# Patient Record
Sex: Female | Born: 1945 | Race: White | Hispanic: No | Marital: Married | Smoking: Never smoker
Health system: Southern US, Community
[De-identification: ages and names within clinical notes are randomized; demographics above are authoritative.]

## PROBLEM LIST (undated history)

## (undated) DIAGNOSIS — I10 Essential (primary) hypertension: Secondary | ICD-10-CM

## (undated) DIAGNOSIS — E079 Disorder of thyroid, unspecified: Secondary | ICD-10-CM

## (undated) DIAGNOSIS — N183 Chronic kidney disease, stage 3 unspecified: Secondary | ICD-10-CM

## (undated) HISTORY — DX: Chronic kidney disease, stage 3 unspecified: N18.30

## (undated) HISTORY — PX: ABDOMINAL HYSTERECTOMY: SHX81

## (undated) HISTORY — PX: SHOULDER SURGERY: SHX246

## (undated) HISTORY — DX: Essential (primary) hypertension: I10

## (undated) HISTORY — PX: KNEE SURGERY: SHX244

## (undated) HISTORY — DX: Chronic kidney disease, stage 3 (moderate): N18.3

## (undated) HISTORY — DX: Disorder of thyroid, unspecified: E07.9

## (undated) HISTORY — PX: CATARACT EXTRACTION: SUR2

---

## 1997-12-13 ENCOUNTER — Other Ambulatory Visit: Admission: RE | Admit: 1997-12-13 | Discharge: 1997-12-13 | Payer: Self-pay | Admitting: Gynecology

## 2000-02-06 ENCOUNTER — Other Ambulatory Visit: Admission: RE | Admit: 2000-02-06 | Discharge: 2000-02-06 | Payer: Self-pay | Admitting: Gynecology

## 2000-08-18 ENCOUNTER — Encounter: Payer: Self-pay | Admitting: Gynecology

## 2000-08-20 ENCOUNTER — Ambulatory Visit (HOSPITAL_COMMUNITY): Admission: RE | Admit: 2000-08-20 | Discharge: 2000-08-20 | Payer: Self-pay | Admitting: Gynecology

## 2000-08-20 ENCOUNTER — Encounter (INDEPENDENT_AMBULATORY_CARE_PROVIDER_SITE_OTHER): Payer: Self-pay

## 2001-02-09 ENCOUNTER — Other Ambulatory Visit: Admission: RE | Admit: 2001-02-09 | Discharge: 2001-02-09 | Payer: Self-pay | Admitting: Gynecology

## 2001-10-26 ENCOUNTER — Ambulatory Visit (HOSPITAL_COMMUNITY): Admission: RE | Admit: 2001-10-26 | Discharge: 2001-10-26 | Payer: Self-pay | Admitting: Orthopedic Surgery

## 2001-10-26 ENCOUNTER — Encounter: Payer: Self-pay | Admitting: Orthopedic Surgery

## 2002-07-28 ENCOUNTER — Other Ambulatory Visit: Admission: RE | Admit: 2002-07-28 | Discharge: 2002-07-28 | Payer: Self-pay | Admitting: Gynecology

## 2004-03-30 ENCOUNTER — Emergency Department (HOSPITAL_COMMUNITY): Admission: EM | Admit: 2004-03-30 | Discharge: 2004-03-30 | Payer: Self-pay | Admitting: Emergency Medicine

## 2005-08-05 ENCOUNTER — Inpatient Hospital Stay (HOSPITAL_COMMUNITY): Admission: RE | Admit: 2005-08-05 | Discharge: 2005-08-09 | Payer: Self-pay | Admitting: Orthopedic Surgery

## 2005-11-03 ENCOUNTER — Ambulatory Visit (HOSPITAL_BASED_OUTPATIENT_CLINIC_OR_DEPARTMENT_OTHER): Admission: RE | Admit: 2005-11-03 | Discharge: 2005-11-03 | Payer: Self-pay | Admitting: Orthopedic Surgery

## 2007-03-24 ENCOUNTER — Ambulatory Visit: Payer: Self-pay

## 2007-03-25 ENCOUNTER — Ambulatory Visit: Payer: Self-pay

## 2007-08-04 ENCOUNTER — Encounter: Admission: RE | Admit: 2007-08-04 | Discharge: 2007-08-04 | Payer: Self-pay | Admitting: Orthopedic Surgery

## 2007-08-24 ENCOUNTER — Ambulatory Visit (HOSPITAL_BASED_OUTPATIENT_CLINIC_OR_DEPARTMENT_OTHER): Admission: RE | Admit: 2007-08-24 | Discharge: 2007-08-24 | Payer: Self-pay | Admitting: Orthopedic Surgery

## 2007-09-04 ENCOUNTER — Inpatient Hospital Stay (HOSPITAL_COMMUNITY): Admission: EM | Admit: 2007-09-04 | Discharge: 2007-09-08 | Payer: Self-pay | Admitting: Emergency Medicine

## 2007-09-27 ENCOUNTER — Emergency Department (HOSPITAL_COMMUNITY): Admission: EM | Admit: 2007-09-27 | Discharge: 2007-09-27 | Payer: Self-pay | Admitting: Emergency Medicine

## 2007-11-16 ENCOUNTER — Ambulatory Visit (HOSPITAL_BASED_OUTPATIENT_CLINIC_OR_DEPARTMENT_OTHER): Admission: RE | Admit: 2007-11-16 | Discharge: 2007-11-16 | Payer: Self-pay | Admitting: Orthopedic Surgery

## 2008-05-15 DIAGNOSIS — Z96659 Presence of unspecified artificial knee joint: Secondary | ICD-10-CM | POA: Insufficient documentation

## 2009-09-10 ENCOUNTER — Ambulatory Visit: Payer: Self-pay | Admitting: Family Medicine

## 2009-09-10 DIAGNOSIS — E039 Hypothyroidism, unspecified: Secondary | ICD-10-CM | POA: Insufficient documentation

## 2009-09-10 DIAGNOSIS — I10 Essential (primary) hypertension: Secondary | ICD-10-CM | POA: Insufficient documentation

## 2009-09-10 DIAGNOSIS — J309 Allergic rhinitis, unspecified: Secondary | ICD-10-CM | POA: Insufficient documentation

## 2009-10-04 ENCOUNTER — Encounter: Payer: Self-pay | Admitting: Family Medicine

## 2009-10-08 ENCOUNTER — Encounter: Payer: Self-pay | Admitting: Family Medicine

## 2010-07-01 ENCOUNTER — Emergency Department (HOSPITAL_COMMUNITY)
Admission: EM | Admit: 2010-07-01 | Discharge: 2010-07-01 | Payer: Self-pay | Source: Home / Self Care | Admitting: Emergency Medicine

## 2010-08-01 NOTE — Miscellaneous (Signed)
Summary: prior auth  Clinical Lists Changes prior auth for nasacort to md to sign. pcp not in again until monday. Dr. Deirdre Priest signed medcoo form.Golden Circle RN  October 04, 2009 3:07 PM

## 2010-08-01 NOTE — Miscellaneous (Signed)
Summary: Rx change  Clinical Lists Changes  Medications: Removed medication of NASACORT AQ 55 MCG/ACT AERS (TRIAMCINOLONE ACETONIDE(NASAL)) 2 sprays ea. nostril daily Added new medication of NASONEX 50 MCG/ACT SUSP (MOMETASONE FUROATE) 2 sprays ea. nostril daily - Signed Rx of NASONEX 50 MCG/ACT SUSP (MOMETASONE FUROATE) 2 sprays ea. nostril daily;  #1 x 3;  Signed;  Entered by: Tinnie Gens MD;  Authorized by: Tinnie Gens MD;  Method used: Electronically to [facility] [id number]*, [address] [place], [place]  [address], Ph: [phone], Fax: [phone]    Prescriptions: NASONEX 50 MCG/ACT SUSP (MOMETASONE FUROATE) 2 sprays ea. nostril daily  #1 x 3   Entered and Authorized by:   Tinnie Gens MD   Signed by:   Tinnie Gens MD on 10/08/2009   Method used:   Electronically to        [facility] [address] (retail)       [address]       [place], [place]  [address]       Ph: [phone]       Fax: [phone]   RxID:   [id number]

## 2010-08-01 NOTE — Assessment & Plan Note (Signed)
Summary: np,df   Vital Signs:  Patient profile:   65 year old female Height:      63 inches Weight:      191 pounds BMI:     33.96 BSA:     1.90 Temp:     98.2 degrees F Pulse rate:   67 / minute BP sitting:   164 / 79  Vitals Entered By: Jone Baseman CMA (September 10, 2009 8:41 AM) CC: np Is Patient Diabetic? No Pain Assessment Patient in pain? no        CC:  np.  History of Present Illness: Here today as new patient.  She has no other complaints.  She sees Dr. Yordi Krager Nones in [facility] and he manages her Hypertension and Hypothyroidism.  Told she needed pap and referred here.  Has no mammogram in many years.  does not want colonoscopy. Complains of allergic signs's.  Usually takes generic claritin but none recently.  Having sore throat, itchy eyes, runny nose.  Habits & Providers  Alcohol-Tobacco-Diet     Alcohol drinks/day: 0     Tobacco Status: never  Exercise-Depression-Behavior     Does Patient Exercise: yes     Type of exercise: walk, bike     Times/week: <3     STD Risk: never     Drug Use: never     Seat Belt Use: always     Sun Exposure: rarely  Past History:  Past Medical History: Allergic rhinitis Hypertension Hypothyroidism  Past Surgical History: Hysterectomy Total knee replacement x 3--left, septic 11 total knee surgeries Bilateral oophorectomy shoulder-bone spurs  Past History:  Care Management: Orthopedics:  Social History: Married Smoking Status:  never Does Patient Exercise:  yes STD Risk:  never Drug Use:  never Risk analyst Use:  always Sun Exposure-Excessive:  rarely  Review of Systems       Still with pain and stiffness in left knee  Physical Exam  General:  alert, well-developed, and well-nourished.   Head:  normocephalic and atraumatic.   Ears:  R ear normal and L ear normal.   Mouth:  good dentition.   Neck:  supple.   Lungs:  normal respiratory effort and normal breath sounds.   Heart:  normal rate, regular  rhythm, and no murmur.   Abdomen:  soft and non-tender.     Impression & Recommendations:  Problem # 1:  Preventive Health Care (ICD-V70.0)  Orders: T-Mammography Bilateral Screening ([address])  Complete Medication List: 1)  Nasacort Aq 55 Mcg/act Aers (Triamcinolone acetonide(nasal)) .... 2 sprays ea. nostril daily  Patient Instructions: 1)  Please schedule a follow-up appointment as needed .    Prevention & Chronic Care Immunizations   Influenza vaccine: Not documented   Influenza vaccine deferral: Refused  (09/10/2009)    Tetanus booster: Not documented    Pneumococcal vaccine: given  (09/22/2007)    H. zoster vaccine: Not documented  Colorectal Screening   Hemoccult: Not documented    Colonoscopy: Not documented   Colonoscopy action/deferral: Refused  (09/10/2009)  Other Screening   Pap smear: Not documented   Pap smear action/deferral: Not indicated S/P hysterectomy  (09/10/2009)    Mammogram: Not documented   Mammogram action/deferral: Ordered  (09/10/2009)    DXA bone density scan: Not documented   Smoking status: never  (09/10/2009)  Lipids   Total Cholesterol: Not documented   LDL: Not documented   LDL Direct: Not documented   HDL: Not documented   Triglycerides: Not documented  Hypertension  Last Blood Pressure: 164 / 79  (09/10/2009)   Serum creatinine: Not documented   BMP action: Not indicated   Serum potassium Not documented    Hypertension flowsheet reviewed?: Yes   Progress toward BP goal: Deteriorated  Self-Management Support :    Hypertension self-management support: Not documented Prescriptions: NASACORT AQ 55 MCG/ACT AERS (TRIAMCINOLONE ACETONIDE(NASAL)) 2 sprays ea. nostril daily  #1 x 3   Entered and Authorized by:   Tinnie Gens MD   Signed by:   Tinnie Gens MD on 09/10/2009   Method used:   Electronically to        [facility] [address] (retail)       [address]       [place], [place]  [address]        Ph: [phone]       Fax: [phone]   RxID:   [id number]

## 2010-08-06 ENCOUNTER — Encounter: Payer: Self-pay | Admitting: *Deleted

## 2010-08-08 ENCOUNTER — Ambulatory Visit (HOSPITAL_COMMUNITY)
Admission: RE | Admit: 2010-08-08 | Discharge: 2010-08-08 | Disposition: A | Discharge status: Home / Self Care | Payer: Medicare Other | Source: Ambulatory Visit | Attending: Dermatology | Admitting: Dermatology

## 2010-08-08 ENCOUNTER — Other Ambulatory Visit (HOSPITAL_COMMUNITY): Payer: Self-pay | Admitting: Dermatology

## 2010-08-08 DIAGNOSIS — L299 Pruritus, unspecified: Secondary | ICD-10-CM

## 2010-09-09 LAB — COMPREHENSIVE METABOLIC PANEL
Alkaline Phosphatase: 55 U/L (ref 39–117)
BUN: 17 mg/dL (ref 6–23)
GFR calc non Af Amer: 43 mL/min — ABNORMAL LOW (ref >60)
Glucose, Bld: 111 mg/dL — ABNORMAL HIGH (ref 70–99)
Potassium: 3.5 mEq/L (ref 3.5–5.1)
Total Protein: 7.2 g/dL (ref 6.0–8.3)

## 2010-11-12 NOTE — Op Note (Signed)
Jill, [name] NO.:  [id number]   MEDICAL RECORD NO.:  [id number]          PATIENT TYPE:  INP   LOCATION:  5029                         FACILITY:  [facility]   PHYSICIAN:  Eulas Post, MD    DATE OF BIRTH:  September 10, 1945   DATE OF PROCEDURE:  09/04/2007  DATE OF DISCHARGE:                               OPERATIVE REPORT   PREPROCEDURAL DIAGNOSIS:  Left septic knee.   POSTPROCEDURAL DIAGNOSIS:  Left septic knee.   PROCEDURE:  Aspiration of left knee.   ANESTHESIA:  Lidocaine.   PROCEDURE:  The left superolateral patellar region was prepped with  Betadine.  A 25-gauge needle was used to inject 3 mL of lidocaine.  An  18-gauge needle was then used to aspirate joint fluid.  Turbid, somewhat  purulent-appearing joint fluid was aspirated.  A gauze was applied.  The  patient tolerated the procedure well.  The aspiration was sent to the  lab for Gram stain, culture, sensitivity, cell count and crystals.      Eulas Post, MD  Electronically Signed     JPL/MEDQ  D:  09/04/2007  T:  09/04/2007  Job:  [id number]

## 2010-11-12 NOTE — Op Note (Signed)
Jill [name], Jill [name]            ACCOUNT NO.:  [id number]   MEDICAL RECORD NO.:  [id number]          PATIENT TYPE:  AMB   LOCATION:  DSC                          FACILITY:  [facility]   PHYSICIAN:  Robert A. Thurston Hole, M.D. DATE OF BIRTH:  1945-08-24   DATE OF PROCEDURE:  11/16/2007  DATE OF DISCHARGE:                               OPERATIVE REPORT   PREOPERATIVE DIAGNOSIS:  Left total knee arthrofibrosis.   POSTOPERATIVE DIAGNOSIS:  Left total knee arthrofibrosis.   PROCEDURE:  Left total knee examination under anesthesia, followed by  manipulation and injection.   SURGEON:  Elana Alm. Thurston Hole, MD   ANESTHESIA:  General.   OPERATIVE TIME:  10 minutes.   COMPLICATIONS:  None.   INDICATIONS FOR PROCEDURE:  Jill [name] is a 65 year old woman who had  previously undergone a total knee replacement approximately 2 years ago.  She has had significant problems with her gaining range of motion and  thus underwent a knee manipulation and arthroscopy approximately 3  months ago.  This was complicated by postoperative infection that  occurred 2 weeks after the surgery, requiring revision of her total knee  polyethylene component with open irrigation debridement and 6 weeks of  IV antibiotics.  She has done well with this with no signs of residual  infection, but has had significant persistent problems with her gaining  range of motion with failed aggressive physical therapy and is now to  undergo knee examination under anesthesia manipulation and injection.   DESCRIPTION:  Jill [name] was brought to the operating room on Nov 16, 2007, placed on operative table on supine position.  After adequate  level of general anesthesia was obtained, her left knee was examined.  Initial range of motion was from -15 to 70 degrees.  A gentle  manipulation was carried out breaking up adhesions and improving flexion  of 120, extension of -5.  The knee remained stable with normal patellar  tracking.  She  received vancomycin 1 g IV preoperatively for  prophylaxis.  The knee was then injected with 20 mL of 0.5% Marcaine  with epinephrine under sterile conditions.  She is allergic to cortisone  injections, but not to prednisone orally and thus, she tolerated this  without any cortisone placed in the injection.  She was then awaken and  taken to recovery room in stable condition.   FOLLOWUP CARE:  Jill [name] should be followed as an outpatient on  Percocet, Robaxin, and prednisone Dosepak to decrease inflammation.  Her  early aggressive physical therapy on the CPM.  She will be back in the  office in a week for recheck and followup.      Robert A. Thurston Hole, M.D.  Electronically Signed     RAW/MEDQ  D:  11/16/2007  T:  11/17/2007  Job:  [id number]

## 2010-11-12 NOTE — Discharge Summary (Signed)
Jill [name], Jill [name]            ACCOUNT NO.:  [id number]   MEDICAL RECORD NO.:  [id number]          PATIENT TYPE:  INP   LOCATION:  5029                         FACILITY:  [facility]   PHYSICIAN:  Elana Alm. Thurston Hole, M.D. DATE OF BIRTH:  [birth year]   DATE OF ADMISSION:  09/04/2007  DATE OF DISCHARGE:  09/08/2007                               DISCHARGE SUMMARY   DISCHARGE DIAGNOSIS:  Left total knee infection.   PROCEDURES:  Left total knee revision with polyethylene exchange on  September 05, 2007, with formal irrigation and debridement.  PICC line placement.   ADMISSION HISTORY AND PHYSICAL:  Jill [name] is a 65 year old  woman who had undergone left total knee EUA manipulation and  arthroscopic lysis of adhesions approximately 10 days ago.  Did very  well for 7 days and then started to develop increased pain and swelling.  Seen in the office on September 02, 2007, and again on September 03, 2007, started  to develop increased pain and swelling.  Started to have drainage from  one portal site on September 03, 2007, and was placed on antibiotics.  Severe  increased pain on September 04, 2007, requiring emergency room visit.  At  that time, Dr. Dion Saucier evaluated her and aspirated her left total knee,  which showed significant purulence and 53,000 white cells, but no  organisms.  CRP level also at that time showed 18.9 and a white blood  cell count of 23,000.  Because of this, it was felt at this time that  she had a significant acute infection of her left total knee.  We  discussed at this time the necessity for IV antibiotics as well as  formal open irrigation debridement of the left total knee with revision  and polyethylene exchange to give her the best chance of curing this  infection, and she agreed with this plan.   PHYSICAL EXAM:  At the time of admission, showed her left knee to be  swollen with purulent drainage from the anterior medial portal site with  significant pain with limited range of  motion.  There was no erythema,  but significant pain.  Right knee revealed full range of motion without  pain.  VASCULAR:  Pulses 2+ and symmetric.  CARDIAC:  Normal rate and rhythm.  No murmurs.  CHEST:  Revealed lungs fields to be clear.  NEUROLOGIC:  She is alert and oriented with normal sensation and  strength in both upper and her lower extremities.  ABDOMINAL:  Revealed normal bowel sounds and abdomen was nontender.  VITAL SIGNS:  Have been well documented in the chart at the time of  admission on the history and physical.   HOSPITAL COURSE:  Jill [name] was admitted from the emergency room.  She  was begun on IV vancomycin after further cultures were obtained.  We  discussed in detail the necessity for formal irrigation and debridement  and revision of polyethylene exchange of her left total knee, which she  agreed to.  She was brought into the operating room on September 05, 2007,  where she underwent formal open irrigation and debridement of her  left  total knee as well as polyethylene exchange.  This is well documented in  the operative note.  She tolerated the procedure well.  Following this,  her elevated white count subsided significantly and was decreased on the  first postoperative day to 9000 and the second postoperative day to  8000, and by the time of discharge on September 08, 2007, white blood cell  count was 7000 with no shift.  Chemistry and laboratories throughout her  hospitalization remained within normal limits.  She had drains which had  been placed at the time of surgery and these were removed on September 07, 2007.  Cultures remained no growth.  Her knee pain subsided  significantly during the course of her hospital stay, and she was  ambulating 250 feet with range of motion over the knee 0-90 degrees by  the time of her discharge on September 08, 2007, as well.  A PICC line was  placed on September 07, 2007, for IV antibiotics to be given for 6 weeks  postoperatively and the  placement of the PICC line was well documented  with a chest x-ray postoperatively and found to be in satisfactory  position.   DISCHARGE PLANS:  Jill [name] will be discharged on IV vancomycin to be  dosed by the pharmacist.  At this point, she is receiving 1 g IV q.12 h.  and peak and trough levels will be drawn routinely.   DISCHARGE MEDICATIONS:  Percocet, Coumadin, Lovenox x2-3 days until her  INR is therapeutic at 2.0.  It is 1.3 at the time of discharge today.  Other medications, Synthroid, enalapril, and Mucinex.   DISCHARGE INSTRUCTIONS:  She will receive home physical therapy, home  Coumadin monitoring, and home vancomycin IV monitoring and  administration.  She will be weightbearing as tolerated with  intermittent use of a walker.   FOLLOWUP INSTRUCTION:  She will see myself in the office in 5 days for  recheck.   DISCHARGE CONDITION:  Stable.      Robert A. Thurston Hole, M.D.  Electronically Signed     RAW/MEDQ  D:  09/08/2007  T:  09/08/2007  Job:  [id number]

## 2010-11-12 NOTE — Op Note (Signed)
[name], [name]            ACCOUNT NO.:  [id number]   MEDICAL RECORD NO.:  [id number]          PATIENT TYPE:  INP   LOCATION:  5029                         FACILITY:  [facility]   PHYSICIAN:  Elana Alm. Thurston Hole, M.D. DATE OF BIRTH:  Oct 12, 1945   DATE OF PROCEDURE:  09/05/2007  DATE OF DISCHARGE:                               OPERATIVE REPORT   PREOPERATIVE DIAGNOSIS:  Left total knee infection.   POSTOPERATIVE DIAGNOSIS:  Left total knee infection.   PROCEDURE:  1. Revision, left total knee prosthesis with polyethylene exchange.  2. Left knee arthrotomy with irrigation and debridement.   SURGEON:  Elana Alm. Thurston Hole, MD   ASSISTANT:  Eulas Post, MD   ANESTHESIA:  General.   OPERATIVE FINDINGS:  1 hour and 30 minutes.   COMPLICATIONS:  None.   INDICATIONS FOR PROCEDURE:  Jill [name] is a 65 year old woman who had  undergone a left total knee replacement 2 years ago.  She has had some  problems with pain and mild arthrofibrosis and thus had underwent left  knee arthroscopy, manipulation, lysis of adhesions, and lateral release  approximately 2 weeks ago.  Over the last 3 days she has had increased  pain and swelling and started having draining from her left knee portal  2 days ago.  She was placed on antibiotics after this was cultured.  She  was brought to the emergency room yesterday and admitted for obvious  septic knee with 53,000 white cells in her knee.  She was immediately  recultured and started on IV antibiotics, and then today brought to the  operating room for formal irrigation and debridement, and polyethylene  exchange with revision.  Risks, complications, benefits of the surgery  have been described to her and her husband in detail and they understand  this completely.   DESCRIPTION OF PROCEDURE:  Jill [name] was brought to the operating room  on September 05, 2007 after femoral nerve block was placed in the holding  room by anesthesia.  She was placed on the  operative table in supine  position.  She had been given vancomycin 1 g IV started yesterday and  given every 12 hours.  After being placed under general anesthesia she  had a Foley catheter placed under sterile conditions.  Her left knee was  examined.  Range of motion from 0 to 110 degrees with purulent drainage  from her medial arthroscopic portal.  Left leg was then prepped using  sterile DuraPrep and draped using sterile technique.  The leg was  exsanguinated and a thigh tourniquet elevated 365 mmHg.   Initially through her previous total knee incision, a new incision was  made.  The underlying subcutaneous tissues were incised along with skin  incision.  Moderate purulent drainage was noted and recultured.  Am  arthrotomy was performed and the underlying prosthesis was found to be  in excellent position and condition.  Moderate exudative synovitis was  thoroughly and aggressively debrided.  The medial portal site which had  become a sinus tract, was excised internally as well and thoroughly  debrided also.   At this point the  polyethylene 10 mm RP spacer was removed.  Again, the  posterior aspect of the knee showed only mild synovitis.  This was very  carefully debrided.  The suprapatellar pouch and medial and lateral  gutters were aggressively debrided with an aggressive synovectomy  carried out for debridement of this.  After this was done, then 9 L  liters of sterile saline was jet lavaged, irrigated throughout the knee.  After this was done then a new 10 mm RP tibial spacer to fit the 2.5  femur was placed, the knee reduced, taken through a full range of  motion, found to be stable.  Range of motion 0 to 125 degrees with  normal patellar tracking.  The patellar prosthesis was also found to be  in excellent condition.   After this was done, then the tourniquet was released.  Hemostasis was  attained with cautery.  Again the wound further irrigated with saline,  and then the  arthrotomy was closed with #1 Ethibond suture over 2 medium  Hemovac drains.  At this point it was felt that there had been a  complete thorough debridement and polyethelene exchange carried out  successfully.  Subcutaneous tissues were then closed with 0 Vicryl and  skin closed with skin staples.  The anterior and medial portal site was  partially excised further and then closed loosely with 3-0 nylon suture.  Sterile dressings and a long leg splint were applied and the patient  awakened and taken to the recovery room in stable condition.  Needle and  sponge count was correct 2 at the end of the case.  Neurovascular status  also normal postoperatively as well.      Robert A. Thurston Hole, M.D.  Electronically Signed     RAW/MEDQ  D:  09/05/2007  T:  09/05/2007  Job:  [id number]

## 2010-11-12 NOTE — H&P (Signed)
NAMEDELANA, [name] NO.:  [id number]   MEDICAL RECORD NO.:  [id number]          PATIENT TYPE:  INP   LOCATION:  5029                         FACILITY:  [facility]   PHYSICIAN:  Eulas Post, MD    DATE OF BIRTH:  1946/03/29   DATE OF ADMISSION:  09/04/2007  DATE OF DISCHARGE:                              HISTORY & PHYSICAL   CHIEF COMPLAINT:  Left knee drainage.   HISTORY:  [name] is a 65 year old woman who is two weeks  status post left knee arthroscopy with lysis of adhesions and lateral  release and manipulation who complains of increasing knee pain,  drainage, swelling, and she reports recent fevers and chills.  She has a  long history of problems with the knee.  She has had a total knee  arthroplasty in this knee in 2007.  She originally had her first knee  operation in 1982 and has had a total of six operations to date.   She complains that the pain is severe when walking.  Nothing makes it  better.  She rates it as an 8 out of 10.  It is localized directly at  the knee.   REVIEW OF SYSTEMS:  She reports recent fevers and chills.  She denies  any recent weight changes.  She denies any recent vision changes or  hearing changes, no chest pain or shortness of breath.  No changes in  her bowel or bladder.  MUSCULOSKELETAL:  Is as above.  No recent rashes.  She denies any neurologic symptoms or any recent psychiatric problems.  She has no endocrine problems with the exception of hypothyroidism.  She  denies any recent immune problems.  She notes the recent potential  infection as her only recent immune problem.   PAST MEDICAL HISTORY:  She has hypertension and hypothyroidism as well  as a history of multiple knee operations.   PAST SURGICAL HISTORY:  A right total knee in 2007.  She had a total of  five other operations as indicated above, the most recent of which was a  knee arthroscopy and lateral release on August 24, 2007, by Dr.  Thurston Hole.   FAMILY HISTORY:  Her father died of a heart attack, and her mother had a  stroke.   SOCIAL HISTORY:  She is a nonsmoker.  She has a total of six kids.   On exam today she is lying in bed and is in no acute distress.  NECK:  She has a midline trachea and no thyromegaly.  No radiculopathy  and full range of motion.  CARDIOVASCULAR EXAM:  She has good pulses distally bilaterally.  She has  somewhat significant edema in the left lower extremity.  RESPIRATORY EXAM:  She has no increase in respiratory efforts.  GI:  Her abdomen is soft, nontender, nondistended, and she has no  rebound or guarding.  LYMPHATIC EXAM:  Her neck and axilla are without lymphadenopathy.  PSYCHIATRIC EXAM:  Her mood and affect are normal, and her judgment and  insight are intact.  NEUROLOGIC EXAM:  Her sensation is intact distally in the lower  extremities.  MUSCULOSKELETAL EXAM:  Unable to be assessed du to pain.  Her digits and  nails are normal.  Right lower extremity demonstrates full range of  motion of the knee with normal stability, no pain to palpation, no  abnormalities to inspection and 5/5 strength.  The left lower extremity  has range of motion from 5 degrees to 60  Degrees with fairly significant pain.  She has some seropurulent  drainage coming from the medial portal which increases with flexion.  This appears to be coming from the joint itself.  She has some mild  erythema around this portal site.  She has a well-healed surgical scar  in the midline of the knee.  The knee feels stable.  Her strength is  intact, although it is limited by pain.   IMPRESSION:  1. Left knee pain with associated drainage and subjective fevers and      chills.  2. Hypothyroidism.  3. Hypertension.  4. History of a total knee arthroplasty.   PLAN:  I am going to plan to do a knee aspiration.  She is going to need  to be admitted for IV antibiotics pending the results of the knee  aspiration.  She will  also receive IV pain medications as needed.  She  is likely to need an irrigation and debridement resulting on the results  of the drainage.  It is possible that this is just a draining hematoma,  however, given her underlying hardware, it is concerning.  We will plan  to perform a knee aspiration and then admit her for antibiotics and  follow her along.      Eulas Post, MD  Electronically Signed     JPL/MEDQ  D:  09/04/2007  T:  09/04/2007  Job:  [id number]

## 2010-11-12 NOTE — Op Note (Signed)
Jill [name], Jill [name]            ACCOUNT NO.:  [id number]   MEDICAL RECORD NO.:  [id number]          PATIENT TYPE:  AMB   LOCATION:  DSC                          FACILITY:  [facility]   PHYSICIAN:  Robert A. Thurston Hole, M.D. 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   PROCEDURE:  1. Left total knee exam under anesthesia, followed by manipulation.  2. Left total knee arthroscopy with lysis of adhesions.  3. Left total knee lateral retinacular release.   SURGEON:  Elana Alm. Thurston Hole, MD   ASSISTANT:  Julien Girt, P.A.   ANESTHESIA:  General.   OPERATIVE TIME:  45 minutes.   COMPLICATIONS:  None.   INDICATIONS FOR PROCEDURE:  Jill [name] is a 65 year old woman who is 2  years status post left total knee replacement who has had significant  persistent pain with some limitation of motion.  Exam and x-rays and  complete workup for infection has been negative for infection and signs  and symptoms have been consistent with persistent pain due to adhesions.  She has failed conservative care and is now to undergo EUA manipulation  and arthroscopic lysis of adhesions.   DESCRIPTION OF PROCEDURE:  Jill [name] was brought to the operating room  on August 24, 2007 and placed on the operative table in supine  position.  She received vancomycin 1 g IV preoperatively for  prophylaxis.  After being placed under general anesthesia, her left knee  was examined.  Range of motion from -5 to 105 degrees.  A gentle  manipulation was carried out, breaking up adhesions and improving  flexion to 120 degrees, extension to 0.  The knee was sterilely injected  with 0.25% Marcaine with epinephrine.  The left leg was then prepped  using sterile DuraPrep and  draped using sterile technique.  Originally  through an anterior and lateral portal and the arthroscope with a pump  attached was placed through an anterior and medial portal and  arthroscopic probe was placed.  On initial inspection, the femoral  component was found to be well-seated in good position, as was the  tibial component and the RP tibial spacer.  There was found to be  significant, dense scarring in the anterior, medial, and lateral  compartments and an arthroscopic shaver was used to thoroughly debride  these adhesions and scar tissue.  The patellar component was in good  position, but there was some lateral patellar tracking noted with very  tight lateral retinacular structures, and thus the lateral retinacular  release was carried out with an ArthroCare wand, which significantly  improved patellar tracking.  No excessive bleeding was encountered with  this.  After this was done it was felt that a complete lysis of  adhesions had been carried out.  There were no signs of infection.  Moderate synovitis had been debrided as well.   At this point it was felt that  all pathology had been satisfactorily  addressed.  The instruments were removed.  Portals were closed with 3-0  nylon suture and injected with 0.25% Marcaine with epinephrine and 4 mg  of morphine.  Sterile dressings were applied, and the patient awakened  and taken to the recovery room in stable condition.   FOLLOWUP CARE:  Jill [name] could be followed as an outpatient on  Percocet and Robaxin with early aggressive physical therapy.  See her  back in the office in a week for sutures out and followup.      Robert A. Thurston Hole, M.D.  Electronically Signed     RAW/MEDQ  D:  08/24/2007  T:  08/25/2007  Job:  [id number]

## 2010-11-15 NOTE — Op Note (Signed)
Jill [name], GRADEL            ACCOUNT NO.:  [id number]   MEDICAL RECORD NO.:  [id number]          PATIENT TYPE:  AMB   LOCATION:  DSC                          FACILITY:  [facility]   PHYSICIAN:  Robert A. Thurston Hole, M.D. DATE OF BIRTH:  04-02-1946   DATE OF PROCEDURE:  11/03/2005  DATE OF DISCHARGE:                                 OPERATIVE REPORT   PREOPERATIVE DIAGNOSIS:  Left knee arthrofibrosis status post total knee  replacement.   POSTOPERATIVE DIAGNOSIS:  Left knee arthrofibrosis status post total knee  replacement.   PROCEDURE:  Left knee EUA followed by manipulation and injection with  Marcaine.   SURGEON:  Elana Alm. Thurston Hole, M.D.   ANESTHESIA:  General.   OPERATIVE TIME:  5 minutes.   COMPLICATIONS:  None.   INDICATION FOR PROCEDURE:  Ms. Passey is a 65 year old woman who underwent  a left total knee replacement approximately 3 months ago.  She has had  significant problems regaining full motion, with significant pain secondary  to arthrofibrosis and is now to undergo EUA manipulation and injection with  Marcaine because she is allergic to cortisone.   DESCRIPTION:  Ms. Schechter was brought to the operating room on Nov 03, 2005,  placed on the operative table in the supine position.  After an adequate  level of general anesthesia was obtained, her left knee was examined.  Range  of motion from 0-95 degrees, the knee was stable with normal patella  tracking.  She underwent a general manipulation and flexion, breaking up  adhesions and improving flexion to 130 degrees, extension remained at 0, her  knee remained stable with normal patella tracking.  The knee was then  sterilely injected with 0.25% Marcaine with epinephrine.  She received  vancomycin 500 mg IV intraoperatively for prophylaxis.  She was then  awakened and taken to the recovery room in stable condition.   FOLLOWUP CARE:  Ms. Lightsey will be followed as an outpatient on Percocet  for pain with early  aggressive physical therapy.  Will see her back in the  office in a week for recheck and followup.      Robert A. Thurston Hole, M.D.  Electronically Signed     RAW/MEDQ  D:  11/03/2005  T:  11/04/2005  Job:  [id number]

## 2010-11-15 NOTE — Op Note (Signed)
[facility]  Patient:    Jill [name], Jill [name]                   MRN: [id number] Proc. Date: 08/20/00 Adm. Date:  [id number] Attending:  Douglass Rivers                           Operative Report  PREOPERATIVE DIAGNOSIS:       Persistent left adnexal mass in a postmenopausal woman.  POSTOPERATIVE DIAGNOSIS:      Persistent left adnexal mass in a postmenopausal woman.  PROCEDURE:                    Laparoscopic left salpingo-oophorectomy.  SURGEON:                      Douglass Rivers, M.D.  ASSISTANT:                    Nadyne Coombes. Fontaine, M.D.  ANESTHESIA:                   General.  ESTIMATED BLOOD LOSS:         Minimal.  FINDINGS:                     There was what appeared to be a left paratubal cyst wrapped around what appeared to be an atrophic ovary.  The remainder of the pelvis was negative.  PATHOLOGY:                    1. Left tube and ovary.                               2. Pelvic washings.  DESCRIPTION OF PROCEDURE:     The patient was taken to the operating room, general anesthesia was induced and she was placed in the dorsal lithotomy position, prepped and draped in the usual sterile fashion.  A sterile sponge stick was placed in the vagina in order to elevate the cuff.  Attention was then turned to the abdomen.  An infraumbilical incision was made with a scalpel, through which a Veress needle was placed.  Placement into the abdominal cavity was confirmed. The opening pressure was 2.  A pneumoperitoneum was created until tympani was appreciated by the liver.  The Veress needle was then removed and a #10/11 disposable trocar was inserted through the infraumbilical port.  Placement into the abdomen was confirmed. The pelvis was inspected.  The findings were as above.  Two second ports were then placed under direct visualization, #5 ports were placed in the lower pelvis.  Between these three ports, the tube and ovary were able to  be elevated.  Further inspection of the pelvis assured that we were free of the ureter, which was visualized.  The infundibulopelvic ligament was cauterized and transected.  The ovary was sharply dissected off the pelvic side wall, which was noted to be hemostatic afterwards.  The pelvis was irrigated.  A #5 laparoscopic camera was then placed through one of the 5 mm ports.  An Endobag was placed through the infraumbilical port.  Using these two ports, the adnexa was brought to the umbilicus without any spillage.  The fluid was drained from the paratubal cyst and was sent separately.  The tube and ovary were then removed.  Of note, at the beginning of the case, after the laparoscope was originally placed, pelvic washings were obtained.  The remaining instruments were then removed under direct visualization.  The infraumbilical port was closed with a figure-of-eight of 0 Vicryl.  The skin was closed with 3-0 plain.  The port was injected with 0.25% lidocaine solution.  The 5 mm ports were closed with tincture of benzoin and Steri-Strips.  All ports were noted to be hemostatic.  The patient tolerated the procedure well.  Sponge, lap, and needle counts were correct x 2.  She was extubated in the OR and transferred to the PACU in stable condition. DD:  08/20/00 TD:  08/20/00 Job: [id number]

## 2010-11-15 NOTE — Op Note (Signed)
NAMEKRIZIA, FLIGHT NO.:  [id number]   MEDICAL RECORD NO.:  [id number]          PATIENT TYPE:  INP   LOCATION:  2871                         FACILITY:  [facility]   PHYSICIAN:  Elana Alm. Thurston Hole, M.D. DATE OF BIRTH:  April 01, 1946   DATE OF PROCEDURE:  08/05/2005  DATE OF DISCHARGE:                                 OPERATIVE REPORT   PREOPERATIVE DIAGNOSIS:  Left knee degenerative joint disease.   POSTOPERATIVE DIAGNOSIS:  Left knee degenerative joint disease.   PROCEDURE:  Left total knee replacement using DePuy cemented total knee  system with #2.5 cemented femur, #3 cemented tibia with 10 mm polyethylene  RP tibial spacer and 35 mm polyethylene cemented patella.   SURGEON:  Elana Alm. Thurston Hole, M.D.   ASSISTANT:  Gifford Shave, PA   ANESTHESIA:  General.   OPERATIVE TIME:  1 hour and 20 minutes.   COMPLICATIONS:  None.   DESCRIPTION OF PROCEDURE:  Ms. Korol was brought to the operating room on  August 05, 2005, after a femoral nerve block was placed in the holding room  by anesthesia. She was placed on the operative table in the supine position.  She received Ancef 1 g IV preoperatively for prophylaxis. After being placed  under general anesthesia, she had a Foley catheter placed under sterile  conditions. Her left knee was examined. Range of motion from -5 to 125  degrees with moderate varus deformity and a stable ligamentous exam with  normal patellar tracking. The left leg was prepped using sterile DuraPrep  and draped using sterile technique. The leg was exsanguinated and a  tourniquet elevated to 365 mm. Initially, through a 15 cm longitudinal  incision based over the patella, initial exposure was made. The underlying  subcutaneous tissues were incised along with the skin incision. A median  arthrotomy was performed revealing an excessive amount of normal-appearing  joint fluid. The articular surfaces were inspected. She had grade 4 changes  medially, grade 3 and 4 changes laterally and in the patellofemoral joint.  Osteophytes were removed from their femoral condyles and tibial plateau. The  medial and lateral meniscal remnants were removed as well as the anterior  cruciate ligament. An intramedullary drill was then drilled up the femoral  canal for placement of the distal femoral cutting jig which was placed in  the appropriate amount of rotation and a distal 11 mm cut was made. The  distal femur was then sized. 2.5 was found to be the appropriate size and a  #2.5 cutting jig was placed and then these cuts were made. The proximal  tibia was then exposed. The tibial spines were removed with a oscillating  saw. Intramedullary drill was then drilled down the tibial canal for  placement of the proximal tibial cutting jig which was placed in the  appropriate amount of rotation and a proximal 6 mm cut was made based off  the medial or lower side. 10 mm spacer blocks were then placed both in  flexion and in extension. This gave excellent balancing, excellent  correction of her flexion and varus deformities. At this point, the #3  tibial  baseplate was based on the cut tibial surface and a keel cut was  made. The PCL box cutter was then placed on the distal femur and these cuts  were then made. After this was done, the 2.5 mm femoral trial was placed and  with the 3 mm tibial baseplate trial and a 10 mm polyethelene inserted, the  knee was reduced, taken through a range of motion from 0 to 125 degrees with  an excellent stability and excellent correction of her flexion and varus  deformities. The patella was then sized. A resurfacing 10 mm cut was made  and 3 locking holes were placed for a 35 mm resurfacing patella. The trial  was placed. Patellofemoral tracking was then evaluated and this was found to  be normal. At this point, it was felt that all of the components were of  excellent size, fit and stability. They were then removed  and the knee was  then jet lavaged, irrigated with 3 L of saline solution. The proximal tibia  was then exposed and a #3 tibial baseplate with cement vacuum was cemented  into position with an excellent fit with excess cement being removed from  around the edges. The 2.5 mm femur was cemented back and it was hammered  into position also with an excellent fit with excess cement being removed  from around the edges. The 10 mm polyethelene RP tibial spacer was then  placed on the tibial baseplate and the knee reduced and taken through a  range of motion 0-125 degrees with excellent stability and excellent  correction of her flexion and varus deformities. A 35 mm polyethylene  patella was cemented back and was then placed in its position and held there  with a  clamp. After the cement had hardened, patellofemoral tracking was  evaluated. This was found to be normal. At this point, it was felt that all  of the components were of excellent size, fit and stability. There was found  to be excellent correction of her flexion and varus deformities with  excellent stability. The knee was further irrigated with saline and then the  tourniquet was released. Hemostasis was obtained with cautery. The  arthrotomy was then closed with #1-0 Ethibond suture over 2 medium Hemovac  drains. The subcutaneous tissue was closed with 0 and 2-0 Vicryl. The  subcuticular layer was closed with 4-0 Monocryl. Steri-Strips were applied.  Sterile dressings were applied and the Hemovac injected with 0.25% Marcaine  with epinephrine and 4 mg of morphine and clamped. The patient was then  awakened, extubated and taken to the recovery room in stable condition.  Needle and sponge count was correct x 2 at the end of the case.      Robert A. Thurston Hole, M.D.  Electronically Signed     RAW/MEDQ  D:  08/05/2005  T:  08/05/2005  Job:  [id number]

## 2010-11-15 NOTE — H&P (Signed)
[facility]  Patient:    Jill [name], Jill [name]                     MRN: [id number] Proc. Date: 08/19/00 Adm. Date:  08/20/00 Attending:  Douglass Rivers, M.D.                         History and Physical  CHIEF COMPLAINT:              Persistent adnexal mass.  HISTORY OF PRESENT ILLNESS:   The patient is a 65 year old G6, P6 postmenopausal woman, who is status post TAH RSO in 1986 secondary to an ovarian cyst and pelvic relaxation.  She was seen for an abnormal examination in August and was noted to have a fullness in the left adnexa, that had not been appreciated on prior examination.  She had an ultrasound done which showed the presence of a simple echo-free cyst, which measured 3.5 x 2 x 2 cm thin-walled cyst.  The patient has an FSH done, which came back elevated; consistent with menopause.  She followed back up with three months later, which showed persistence of the ovarian cyst at that point.  PAST OB-GYN HISTORY:          Significant for six vaginal deliveries and hysterectomy in 1986, as mentioned above.  No history of abnormal Pap smears.  MEDICAL HISTORY:              1. Hypothyroidism.                               2. Chronic hypertension.  SURGICAL HISTORY:             1. Knee surgery in 1982.                               2. Hysterectomy with bladder tack in 1986.                               3. Shoulder surgery in 1993.  MEDICATIONS:                  Synthroid and Norvasc.  ALLERGIES:                    None.  FAMILY HISTORY:               Significant for heart disease in her father, grandfather and brother; with early MIs and coronary artery disease.  REVIEW OF SYSTEMS:            The patient denies any change in bowel habits. Denies bloating.  Denies changes in weight, generalized malaise or any other symptomatology.  PHYSICAL EXAMINATION:  GENERAL:                      She is a well-appearing female; in no acute                  distress.  HEENT:                        Unremarkable.  NECK:  Thyroid is ______ and mobile.  HEART:                        Regular rate.  LUNGS:                        Clear to auscultation.  BREASTS:                      Without masses, discharge or retractions in the                               supine and upright position.  GYNECOLOGIC:                  Normal external female genitalia.  The vagina is pale and moist.  The cervix and uterus are surgically absent.  On bimanual examination there is a fullness in the left adnexa; the right is negative. The rectovaginal examination is confirmatory.  ASSESSMENT:                   A 65 year old with a simple adnexal mass.  The differential was discussed with the patient, including benign and serous cyst adenoma versus a peritubal cyst.  However, a borderline malignancy could not be ruled out.  The patient elects to undergo a left salpingo-oophorectomy laparoscopically, with pelvic washing.  The risks and benefits of the procedure were discussed with the patient, including the risks of damage to the underlying ______, major vessels or ureters, bowel herniation or risk of DVT, the risk of transfusion and infections as a result thereof, the risks of possible conversion to laparotomy; all were accepted and the patient will present on August 20, 2000 for surgery. DD:  08/19/00 TD:  08/19/00 Job: [id number]

## 2010-11-15 NOTE — Discharge Summary (Signed)
Jill [name], Jill [name]            ACCOUNT NO.:  [id number]   MEDICAL RECORD NO.:  [id number]          PATIENT TYPE:  INP   LOCATION:  5033                         FACILITY:  [facility]   PHYSICIAN:  Elana Alm. Thurston Hole, M.D. DATE OF BIRTH:  09-20-1945   DATE OF ADMISSION:  08/05/2005  DATE OF DISCHARGE:  08/09/2005                                 DISCHARGE SUMMARY   ADMITTING DIAGNOSES:  1.  End-stage degenerative joint disease, left knee.  2.  Hypertension.  3.  Hypothyroidism.   DISCHARGE DIAGNOSIS:  1.  End-stage degenerative joint disease, left knee.  2.  Hypertension.  3.  Hypothyroidism.   HISTORY OF PRESENT ILLNESS:  The patient is a 65 year old white female with  a history of end-stage DJD of her left knee.  She has failed conservative  care including anti-inflammatories and articular cortisone injections, all  without success.  She understands the risks, benefits and possible  complications of a left total knee replacement and is without question.   PROCEDURE:  On August 05, 2005, the patient underwent a left total knee  replacement by Dr. Thurston Hole and a femoral nerve block by anesthesia.  She  tolerated both procedures well and was admitted postoperatively for pain  control, DVT prophylaxis and physical therapy.   Postop day 1, the patient was doing well.  Her pain was under control with a  PCA.  She had a T-max of 99.8.  Hemoglobin was 11.2.  Her INR was 1.  She  was metabolically stable.  Surgical wound was well-approximated.  Her drain  was discontinued.  Her Percocet was discontinued.  Her PCA was discontinued.  She was started on oxy IR 1-3 q.3h. p.r.n. pain.  She was given a 500 mL  bolus of normal saline and discharge planning was begun.  Postop day 1 in  the evening, the patient spiked a temperature of 102.  Blood cultures were  drawn.  Chest x-ray was negative.  White cell count was decreasing to 9.7.  Her hemoglobin was 11.4.  She was metabolically stable.  Postop  day 3, the  patient continued to do well.  She did have some calf pain.  Doppler was  done and ruled out a DVT.  She continued to spike fevers with a T-max of  101.  Temperature on exam was 99.9.  Her hemoglobin was 11.4.  She is  continuing to take Tylenol for her fever.  Postop day 4, the patient  significantly improved.  She was afebrile.  Ultrasound for her Doppler was  negative.  Hemoglobin was 9.7.  She was metabolically stable.  She was  discharged to home in stable condition weightbearing as tolerated on a  regular diet.  She was given acyclovir ointment for a cold sore.   DISCHARGE MEDICATIONS:  1.  Norvasc 5 mg daily.  2.  Synthroid 75 mcg daily.  3.  Claritin 10 mg daily.  4.  Coumadin 5 mg daily.  5.  Oxycodone 5 mg daily.  6.  Colace 100 mg twice a day.   CPM 0-90 degrees 8 hours a day.  Lay her left heel  on a folded pillow.  [facility] is her home health agency.  She will follow up with Dr.  Thurston Hole on August 19, 2005.      Kirstin Shepperson, P.A.      Robert A. Thurston Hole, M.D.  Electronically Signed    KS/MEDQ  D:  10/17/2005  T:  10/20/2005  Job:  [id number]

## 2011-03-21 LAB — I-STAT 8, (EC8 V) (CONVERTED LAB)
Acid-base deficit: 2
BUN: 13
Bicarbonate: 22
HCT: 46
Hemoglobin: 15.6 — ABNORMAL HIGH
Operator id: 112821
Sodium: 138
TCO2: 23

## 2011-03-24 LAB — BASIC METABOLIC PANEL
BUN: 5 — ABNORMAL LOW
BUN: 5 — ABNORMAL LOW
CO2: 21
CO2: 27
CO2: 28
Calcium: 8.4
Chloride: 101
Chloride: 106
Creatinine, Ser: 0.93
GFR calc Af Amer: >60
GFR calc Af Amer: >60
GFR calc non Af Amer: 56 — ABNORMAL LOW
GFR calc non Af Amer: 58 — ABNORMAL LOW
Glucose, Bld: 103 — ABNORMAL HIGH
Glucose, Bld: 111 — ABNORMAL HIGH
Potassium: 3.5
Potassium: 3.7
Sodium: 131 — ABNORMAL LOW
Sodium: 134 — ABNORMAL LOW

## 2011-03-24 LAB — CBC
HCT: 31.3 — ABNORMAL LOW
HCT: 32 — ABNORMAL LOW
HCT: 32.2 — ABNORMAL LOW
Hemoglobin: 10.9 — ABNORMAL LOW
Hemoglobin: 13.5
MCHC: 34
MCHC: 34.1
MCHC: 34.1
MCHC: 34.4
MCV: 90.2
MCV: 90.6
MCV: 90.8
MCV: 89.2
Platelets: 250
Platelets: 270
RBC: 3.49 — ABNORMAL LOW
RBC: 4.4
RDW: 12.3
RDW: 12.5
RDW: 12.2
WBC: 10.4
WBC: 23.3 — ABNORMAL HIGH

## 2011-03-24 LAB — PROTIME-INR
INR: 2.3 — ABNORMAL HIGH
Prothrombin Time: 13.8
Prothrombin Time: 14.9
Prothrombin Time: 16.2 — ABNORMAL HIGH

## 2011-03-24 LAB — BODY FLUID CULTURE: Culture: NO GROWTH

## 2011-03-24 LAB — ANAEROBIC CULTURE

## 2011-03-24 LAB — TISSUE CULTURE

## 2011-03-24 LAB — POCT I-STAT, CHEM 8
BUN: 9
Calcium, Ion: 1.16
Chloride: 102
Glucose, Bld: 106 — ABNORMAL HIGH
TCO2: 25

## 2011-03-24 LAB — SYNOVIAL CELL COUNT + DIFF, W/ CRYSTALS
Lymphocytes-Synovial Fld: 7
Monocyte-Macrophage-Synovial Fluid: 7 — ABNORMAL LOW

## 2011-03-24 LAB — DIFFERENTIAL
Basophils Absolute: 0
Basophils Relative: 0
Eosinophils Absolute: 0.1
Eosinophils Absolute: 1 — ABNORMAL HIGH
Eosinophils Relative: 0
Lymphocytes Relative: 12
Lymphs Abs: 2.8
Monocytes Absolute: 0.6
Monocytes Relative: 4
Neutro Abs: 6.1
Neutrophils Relative %: 83 — ABNORMAL HIGH
Neutrophils Relative %: 65

## 2011-03-24 LAB — APTT
aPTT: 29
aPTT: 59 — ABNORMAL HIGH

## 2011-03-24 LAB — CULTURE, ROUTINE-ABSCESS

## 2011-03-24 LAB — POCT CARDIAC MARKERS
Operator id: 114141
Troponin i, poc: <0.05

## 2011-03-26 LAB — DIFFERENTIAL
Eosinophils Absolute: 0.3
Eosinophils Relative: 4
Lymphocytes Relative: 34
Lymphs Abs: 2.3
Monocytes Absolute: 0.3
Monocytes Relative: 5

## 2011-03-26 LAB — CBC
HCT: 39.4
Hemoglobin: 13.1
MCHC: 33.3
MCV: 88.5
RBC: 4.45
WBC: 6.7

## 2011-03-26 LAB — BASIC METABOLIC PANEL
CO2: 27
Chloride: 105
Creatinine, Ser: 0.89
GFR calc Af Amer: >60
Potassium: 4.5

## 2011-03-26 LAB — SEDIMENTATION RATE: Sed Rate: 22

## 2011-05-20 DIAGNOSIS — M25569 Pain in unspecified knee: Secondary | ICD-10-CM | POA: Insufficient documentation

## 2011-10-13 ENCOUNTER — Other Ambulatory Visit: Payer: Self-pay | Admitting: Family Medicine

## 2011-10-13 ENCOUNTER — Ambulatory Visit
Admission: RE | Admit: 2011-10-13 | Discharge: 2011-10-13 | Disposition: A | Discharge status: Home / Self Care | Payer: Medicare Other | Source: Ambulatory Visit | Attending: Family Medicine | Admitting: Family Medicine

## 2011-10-13 DIAGNOSIS — R52 Pain, unspecified: Secondary | ICD-10-CM

## 2011-10-13 DIAGNOSIS — W19XXXA Unspecified fall, initial encounter: Secondary | ICD-10-CM

## 2012-05-04 ENCOUNTER — Ambulatory Visit
Admission: RE | Admit: 2012-05-04 | Discharge: 2012-05-04 | Disposition: A | Discharge status: Home / Self Care | Payer: Medicare Other | Source: Ambulatory Visit | Attending: Family Medicine | Admitting: Family Medicine

## 2012-05-04 ENCOUNTER — Other Ambulatory Visit: Payer: Self-pay | Admitting: Family Medicine

## 2012-05-04 DIAGNOSIS — R053 Chronic cough: Secondary | ICD-10-CM

## 2012-05-04 DIAGNOSIS — R05 Cough: Secondary | ICD-10-CM

## 2012-05-12 ENCOUNTER — Other Ambulatory Visit: Payer: Self-pay | Admitting: Family Medicine

## 2012-05-12 DIAGNOSIS — M25519 Pain in unspecified shoulder: Secondary | ICD-10-CM

## 2012-05-15 ENCOUNTER — Ambulatory Visit
Admission: RE | Admit: 2012-05-15 | Discharge: 2012-05-15 | Disposition: A | Discharge status: Home / Self Care | Payer: Medicare Other | Source: Ambulatory Visit | Attending: Family Medicine | Admitting: Family Medicine

## 2012-05-15 DIAGNOSIS — M25519 Pain in unspecified shoulder: Secondary | ICD-10-CM

## 2012-12-27 ENCOUNTER — Ambulatory Visit (INDEPENDENT_AMBULATORY_CARE_PROVIDER_SITE_OTHER): Payer: Medicare Other | Admitting: Family Medicine

## 2012-12-27 ENCOUNTER — Encounter: Payer: Self-pay | Admitting: Family Medicine

## 2012-12-27 VITALS — BP 120/70 | HR 68 | Temp 97.4°F | Resp 16 | Ht 61.0 in | Wt 183.0 lb

## 2012-12-27 DIAGNOSIS — J209 Acute bronchitis, unspecified: Secondary | ICD-10-CM

## 2012-12-27 MED ORDER — DOXYCYCLINE HYCLATE 100 MG PO TABS: 100.0000 mg | ORAL_TABLET | Freq: Two times a day (BID) | ORAL | Status: DC

## 2012-12-27 MED ORDER — GUAIFENESIN-CODEINE 100-10 MG/5ML PO SYRP: 5.0000 mL | ORAL_SOLUTION | Freq: Three times a day (TID) | ORAL | Status: DC | PRN

## 2012-12-27 NOTE — Patient Instructions (Addendum)
Use nasal saline Cough medication sent Antibiotics Call if not improved

## 2012-12-29 ENCOUNTER — Encounter: Payer: Self-pay | Admitting: Family Medicine

## 2012-12-29 DIAGNOSIS — J209 Acute bronchitis, unspecified: Secondary | ICD-10-CM | POA: Insufficient documentation

## 2012-12-29 NOTE — Progress Notes (Signed)
  Subjective:    [name], female    DOB: 1946/02/11, 67 y.o.   MRN: [id number]  HPI  Patient here with cough for the past 2 weeks. Cough with mild sputum production. She has a history of recurrent pneumonia in the past. She was in [place] on vacation when her symptoms started at the end of her trip and have continued to worsen. She's tried Allegra and over-the-counter Robitussin with little improvement. She denies any current fever or wheezing. Gets SOB with severe coughing, also sore in chest from coughing  Review of Systems   GEN- + fatigue, fever, weight loss,weakness, recent illness HEENT- denies eye drainage, change in vision, nasal discharge, CVS- denies chest pain, palpitations RESP- + SOB,+ cough, wheeze Neuro- denies headache, dizziness, syncope, seizure activity      Objective:   Physical Exam GEN- NAD, alert and oriented x3 HEENT- PERRL, EOMI, non injected sclera, pink conjunctiva, MMM, oropharynx clear, TM clear bilat no effusion,no  maxillary sinus tenderness,nares clear Neck- Supple, no LAD CVS- RRR, no murmur RESP-course BS bilaterally EXT- No edema Pulses- Radial 2+          Assessment & Plan:

## 2012-12-29 NOTE — Assessment & Plan Note (Addendum)
We'll treat for acute bronchitis. She has multiple allergies to medications therefore will put her on doxycycline twice a day with her history of recurrent pneumonia which typically start out like her current symptoms. She will also be given Robitussin AC for the cough. She does not improve obtain chest x-ray. She is due for repeat pneumovax as her previous was before age 67

## 2013-04-06 ENCOUNTER — Other Ambulatory Visit: Payer: Self-pay | Admitting: Family Medicine

## 2013-04-06 MED ORDER — LEVOTHYROXINE SODIUM 100 MCG PO TABS: 100.0000 ug | ORAL_TABLET | Freq: Every day | ORAL | Status: DC

## 2013-04-06 MED ORDER — LOSARTAN POTASSIUM 100 MG PO TABS: 100.0000 mg | ORAL_TABLET | Freq: Every day | ORAL | Status: DC

## 2013-04-06 NOTE — Telephone Encounter (Signed)
Rx Refilled  

## 2013-08-15 ENCOUNTER — Other Ambulatory Visit: Payer: Medicare HMO

## 2013-08-15 DIAGNOSIS — E785 Hyperlipidemia, unspecified: Secondary | ICD-10-CM

## 2013-08-15 DIAGNOSIS — E039 Hypothyroidism, unspecified: Secondary | ICD-10-CM

## 2013-08-15 DIAGNOSIS — I10 Essential (primary) hypertension: Secondary | ICD-10-CM

## 2013-08-15 LAB — CBC WITH DIFFERENTIAL/PLATELET
Basophils Absolute: 0.1 10*3/uL (ref 0.0–0.1)
Basophils Relative: 1 % (ref 0–1)
EOS ABS: 0.3 10*3/uL (ref 0.0–0.7)
EOS PCT: 5 % (ref 0–5)
HEMATOCRIT: 42.9 % (ref 36.0–46.0)
HEMOGLOBIN: 14.4 g/dL (ref 12.0–15.0)
LYMPHS ABS: 2.6 10*3/uL (ref 0.7–4.0)
Lymphocytes Relative: 39 % (ref 12–46)
MCH: 31.2 pg (ref 26.0–34.0)
MCHC: 33.6 g/dL (ref 30.0–36.0)
MCV: 92.9 fL (ref 78.0–100.0)
MONOS PCT: 7 % (ref 3–12)
Monocytes Absolute: 0.5 10*3/uL (ref 0.1–1.0)
Neutro Abs: 3.2 10*3/uL (ref 1.7–7.7)
Neutrophils Relative %: 48 % (ref 43–77)
Platelets: 273 10*3/uL (ref 150–400)
RBC: 4.62 MIL/uL (ref 3.87–5.11)
RDW: 13.1 % (ref 11.5–15.5)
WBC: 6.6 10*3/uL (ref 4.0–10.5)

## 2013-08-15 LAB — LIPID PANEL
CHOL/HDL RATIO: 2.9 ratio
CHOLESTEROL: 176 mg/dL (ref 0–200)
HDL: 60 mg/dL (ref >39)
LDL Cholesterol: 100 mg/dL — ABNORMAL HIGH (ref 0–99)
TRIGLYCERIDES: 78 mg/dL (ref <150)
VLDL: 16 mg/dL (ref 0–40)

## 2013-08-15 LAB — COMPREHENSIVE METABOLIC PANEL
ALT: 12 U/L (ref 0–35)
AST: 14 U/L (ref 0–37)
Albumin: 4.2 g/dL (ref 3.5–5.2)
Alkaline Phosphatase: 57 U/L (ref 39–117)
BILIRUBIN TOTAL: 0.4 mg/dL (ref 0.2–1.2)
BUN: 15 mg/dL (ref 6–23)
CALCIUM: 9.2 mg/dL (ref 8.4–10.5)
CHLORIDE: 108 meq/L (ref 96–112)
CO2: 26 meq/L (ref 19–32)
CREATININE: 0.97 mg/dL (ref 0.50–1.10)
GLUCOSE: 97 mg/dL (ref 70–99)
Potassium: 4.7 mEq/L (ref 3.5–5.3)
Sodium: 141 mEq/L (ref 135–145)
TOTAL PROTEIN: 6.7 g/dL (ref 6.0–8.3)

## 2013-08-15 LAB — TSH: TSH: 5.256 u[IU]/mL — ABNORMAL HIGH (ref 0.350–4.500)

## 2013-08-18 ENCOUNTER — Encounter: Payer: Self-pay | Admitting: Family Medicine

## 2013-08-18 ENCOUNTER — Ambulatory Visit (INDEPENDENT_AMBULATORY_CARE_PROVIDER_SITE_OTHER): Payer: Medicare HMO | Admitting: Family Medicine

## 2013-08-18 VITALS — BP 128/72 | HR 68 | Temp 97.1°F | Resp 20 | Ht 63.5 in | Wt 187.0 lb

## 2013-08-18 DIAGNOSIS — E039 Hypothyroidism, unspecified: Secondary | ICD-10-CM

## 2013-08-18 DIAGNOSIS — I10 Essential (primary) hypertension: Secondary | ICD-10-CM

## 2013-08-18 DIAGNOSIS — Z23 Encounter for immunization: Secondary | ICD-10-CM

## 2013-08-18 MED ORDER — LEVOTHYROXINE SODIUM 112 MCG PO TABS: 112.0000 ug | ORAL_TABLET | Freq: Every day | ORAL | Status: DC

## 2013-08-18 NOTE — Addendum Note (Signed)
Addended by: Legrand RamsWILLIS, SANDY B on: 08/18/2013 09:01 AM   Modules accepted: Orders

## 2013-08-18 NOTE — Progress Notes (Signed)
Subjective:    [name], female    DOB: 05-24-1946, 68 y.o.   MRN: [id number]  HPI Patient here today for followup of her hypertension and hypothyroidism. Her TSH is subtherapeutic at greater than 5. She's currently taking levothyroxine 100 mcg by mouth daily. She does report fatigue and difficulty losing weight. Otherwise her blood pressures well controlled at 128/72. She denies any chest pain or shortness of breath or dyspnea on exertion. However she has a very concerning family history for early cardiovascular disease. One brother had CABG in his 54s. Her other brother had a CABG in his 57s. Her father and paternal grandfather both died of massive MI.   Past Medical History  Diagnosis Date  . Hypertension   . Thyroid disease     hypothyroid   Current Outpatient Prescriptions on File Prior to Visit  Medication Sig Dispense Refill  . antiseptic oral rinse (BIOTENE) LIQD 15 mLs by Mouth Rinse route as needed.      Marland Kitchen aspirin 81 MG tablet Take 81 mg by mouth daily.      Marland Kitchen docusate sodium (COLACE) 100 MG capsule Take 100 mg by mouth 2 (two) times daily.      . fexofenadine (ALLEGRA) 180 MG tablet Take 180 mg by mouth daily.      Marland Kitchen losartan (COZAAR) 100 MG tablet Take 1 tablet (100 mg total) by mouth daily.  90 tablet  1   No current facility-administered medications on file prior to visit.   Allergies  Allergen Reactions  . Cortizone-10 [Hydrocortisone]   . Penicillins   . Tylenol [Acetaminophen]    History   Social History  . Marital Status: Married    Spouse Name: N/A    Number of Children: N/A  . Years of Education: N/A   Occupational History  . Not on file.   Social History Main Topics  . Smoking status: Never Smoker   . Smokeless tobacco: Not on file  . Alcohol Use: Not on file  . Drug Use: Not on file  . Sexual Activity: Not on file   Other Topics Concern  . Not on file   Social History Narrative  . No narrative on file    Lab on  08/15/2013  Component Date Value Ref Range Status  . WBC 08/15/2013 6.6  4.0 - 10.5 K/uL Final  . RBC 08/15/2013 4.62  3.87 - 5.11 MIL/uL Final  . Hemoglobin 08/15/2013 14.4  12.0 - 15.0 g/dL Final  . HCT 08/15/2013 42.9  36.0 - 46.0 % Final  . MCV 08/15/2013 92.9  78.0 - 100.0 fL Final  . MCH 08/15/2013 31.2  26.0 - 34.0 pg Final  . MCHC 08/15/2013 33.6  30.0 - 36.0 g/dL Final  . RDW 08/15/2013 13.1  11.5 - 15.5 % Final  . Platelets 08/15/2013 273  150 - 400 K/uL Final  . Neutrophils Relative % 08/15/2013 48  43 - 77 % Final  . Neutro Abs 08/15/2013 3.2  1.7 - 7.7 K/uL Final  . Lymphocytes Relative 08/15/2013 39  12 - 46 % Final  . Lymphs Abs 08/15/2013 2.6  0.7 - 4.0 K/uL Final  . Monocytes Relative 08/15/2013 7  3 - 12 % Final  . Monocytes Absolute 08/15/2013 0.5  0.1 - 1.0 K/uL Final  . Eosinophils Relative 08/15/2013 5  0 - 5 % Final  . Eosinophils Absolute 08/15/2013 0.3  0.0 - 0.7 K/uL Final  . Basophils Relative 08/15/2013 1  0 -  1 % Final  . Basophils Absolute 08/15/2013 0.1  0.0 - 0.1 K/uL Final  . Smear Review 08/15/2013 Criteria for review not met   Final  . Sodium 08/15/2013 141  135 - 145 mEq/L Final  . Potassium 08/15/2013 4.7  3.5 - 5.3 mEq/L Final  . Chloride 08/15/2013 108  96 - 112 mEq/L Final  . CO2 08/15/2013 26  19 - 32 mEq/L Final  . Glucose, Bld 08/15/2013 97  70 - 99 mg/dL Final  . BUN 08/15/2013 15  6 - 23 mg/dL Final  . Creat 08/15/2013 0.97  0.50 - 1.10 mg/dL Final  . Total Bilirubin 08/15/2013 0.4  0.2 - 1.2 mg/dL Final   ** Please note change in reference range(s). **  . Alkaline Phosphatase 08/15/2013 57  39 - 117 U/L Final  . AST 08/15/2013 14  0 - 37 U/L Final  . ALT 08/15/2013 12  0 - 35 U/L Final  . Total Protein 08/15/2013 6.7  6.0 - 8.3 g/dL Final  . Albumin 08/15/2013 4.2  3.5 - 5.2 g/dL Final  . Calcium 08/15/2013 9.2  8.4 - 10.5 mg/dL Final  . Cholesterol 08/15/2013 176  0 - 200 mg/dL Final   Comment: ATP III Classification:                                 < 200        mg/dL        Desirable                               200 - 239     mg/dL        Borderline High                               >= 240        mg/dL        High                             . Triglycerides 08/15/2013 78  <150 mg/dL Final  . HDL 08/15/2013 60  >39 mg/dL Final  . Total CHOL/HDL Ratio 08/15/2013 2.9   Final  . VLDL 08/15/2013 16  0 - 40 mg/dL Final  . LDL Cholesterol 08/15/2013 100* 0 - 99 mg/dL Final   Comment:                            Total Cholesterol/HDL Ratio:CHD Risk                                                 Coronary Heart Disease Risk Table                                                                 Men       Women  1/2 Average Risk              3.4        3.3                                       Average Risk              5.0        4.4                                    2X Average Risk              9.6        7.1                                    3X Average Risk             23.4       11.0                          Use the calculated Patient Ratio above and the CHD Risk table                           to determine the patient's CHD Risk.                          ATP III Classification (LDL):                                < 100        mg/dL         Optimal                               100 - 129     mg/dL         Near or Above Optimal                               130 - 159     mg/dL         Borderline High                               160 - 189     mg/dL         High                                > 190        mg/dL         Very High                             . TSH 08/15/2013 5.256* 0.350 - 4.500 uIU/mL Final      Review of Systems  All other systems reviewed and are  negative.       Objective:   Physical Exam  Vitals reviewed. Neck: Neck supple. No JVD present. No thyromegaly present.  Cardiovascular: Normal rate, regular rhythm and normal heart sounds.   No murmur  heard. Pulmonary/Chest: Effort normal and breath sounds normal. No respiratory distress. She has no wheezes. She has no rales.  Abdominal: Soft. Bowel sounds are normal. She exhibits no distension. There is no tenderness. There is no rebound and no guarding.  Musculoskeletal: She exhibits no edema.  Lymphadenopathy:    She has no cervical adenopathy.          Assessment & Plan:  1. HYPOTHYROIDISM Increase levothyroxine to 112 mcg by mouth daily and recheck TSH in 2 months.  2. HYPERTENSION Blood pressure and cholesterol are well controlled. Continue current medications at the present dosages. I had a long discussion with the patient about performing a stress test given her family history..Evidence based medicine does not promote the use of a stress test for someone who is asymptomatic, given her strong family history I do think it would be reasonable to obtain an exercise treadmill test to evaluate for subclinical cardiac ischemia.  I will consult cardiology. - Ambulatory referral to Cardiology Patient received Prevnar 13 today in the office.

## 2013-09-16 ENCOUNTER — Telehealth: Payer: Self-pay | Admitting: *Deleted

## 2013-09-16 NOTE — Telephone Encounter (Signed)
[facility], [facility] from [facility] wants to know if he can change her levothyroxine 112mcg to generic [facility],112 mcg states [facility] and [facility] formulary are not the same.   Call back number is [phone] Lars Pinkshris Pharm tech

## 2013-09-19 NOTE — Telephone Encounter (Signed)
I am okay with switch. 

## 2013-09-19 NOTE — Telephone Encounter (Signed)
Contacted Chris from [facility] and is aware that is ok to switch her medication

## 2013-10-05 ENCOUNTER — Other Ambulatory Visit: Payer: Self-pay | Admitting: Family Medicine

## 2013-10-20 ENCOUNTER — Other Ambulatory Visit: Payer: Medicare HMO

## 2013-10-20 ENCOUNTER — Other Ambulatory Visit: Payer: Self-pay | Admitting: Family Medicine

## 2013-10-20 DIAGNOSIS — E039 Hypothyroidism, unspecified: Secondary | ICD-10-CM

## 2013-10-20 LAB — TSH: TSH: 0.263 u[IU]/mL — AB (ref 0.350–4.500)

## 2013-10-20 MED ORDER — LOSARTAN POTASSIUM 100 MG PO TABS: ORAL_TABLET | ORAL | Status: DC

## 2013-10-20 MED ORDER — FEXOFENADINE HCL 180 MG PO TABS: 180.0000 mg | ORAL_TABLET | Freq: Every day | ORAL | Status: DC

## 2013-10-20 MED ORDER — LEVOTHYROXINE SODIUM 112 MCG PO TABS: 112.0000 ug | ORAL_TABLET | Freq: Every day | ORAL | Status: DC

## 2013-10-31 ENCOUNTER — Other Ambulatory Visit: Payer: Self-pay | Admitting: Family Medicine

## 2013-10-31 DIAGNOSIS — E039 Hypothyroidism, unspecified: Secondary | ICD-10-CM

## 2013-10-31 MED ORDER — LEVOTHYROXINE SODIUM 100 MCG PO TABS: 100.0000 ug | ORAL_TABLET | Freq: Every day | ORAL | Status: DC

## 2014-04-26 ENCOUNTER — Other Ambulatory Visit: Payer: Medicare HMO

## 2014-04-26 DIAGNOSIS — I10 Essential (primary) hypertension: Secondary | ICD-10-CM

## 2014-04-26 DIAGNOSIS — E782 Mixed hyperlipidemia: Secondary | ICD-10-CM

## 2014-04-26 DIAGNOSIS — E039 Hypothyroidism, unspecified: Secondary | ICD-10-CM

## 2014-04-26 DIAGNOSIS — Z79899 Other long term (current) drug therapy: Secondary | ICD-10-CM

## 2014-04-26 LAB — CBC WITH DIFFERENTIAL/PLATELET
BASOS ABS: 0.1 10*3/uL (ref 0.0–0.1)
BASOS PCT: 1 % (ref 0–1)
EOS PCT: 4 % (ref 0–5)
Eosinophils Absolute: 0.2 10*3/uL (ref 0.0–0.7)
HEMATOCRIT: 41 % (ref 36.0–46.0)
Hemoglobin: 14 g/dL (ref 12.0–15.0)
Lymphocytes Relative: 33 % (ref 12–46)
Lymphs Abs: 1.7 10*3/uL (ref 0.7–4.0)
MCH: 30.9 pg (ref 26.0–34.0)
MCHC: 34.1 g/dL (ref 30.0–36.0)
MCV: 90.5 fL (ref 78.0–100.0)
MONO ABS: 0.5 10*3/uL (ref 0.1–1.0)
Monocytes Relative: 10 % (ref 3–12)
Neutro Abs: 2.8 10*3/uL (ref 1.7–7.7)
Neutrophils Relative %: 52 % (ref 43–77)
PLATELETS: 248 10*3/uL (ref 150–400)
RBC: 4.53 MIL/uL (ref 3.87–5.11)
RDW: 13.2 % (ref 11.5–15.5)
WBC: 5.3 10*3/uL (ref 4.0–10.5)

## 2014-04-26 LAB — LIPID PANEL
Cholesterol: 159 mg/dL (ref 0–200)
HDL: 48 mg/dL (ref >39)
LDL Cholesterol: 96 mg/dL (ref 0–99)
Total CHOL/HDL Ratio: 3.3 Ratio
Triglycerides: 77 mg/dL (ref <150)
VLDL: 15 mg/dL (ref 0–40)

## 2014-04-26 LAB — COMPREHENSIVE METABOLIC PANEL
ALBUMIN: 3.7 g/dL (ref 3.5–5.2)
ALT: 18 U/L (ref 0–35)
AST: 19 U/L (ref 0–37)
Alkaline Phosphatase: 60 U/L (ref 39–117)
BILIRUBIN TOTAL: 0.3 mg/dL (ref 0.2–1.2)
BUN: 13 mg/dL (ref 6–23)
CALCIUM: 8.5 mg/dL (ref 8.4–10.5)
CHLORIDE: 109 meq/L (ref 96–112)
CO2: 24 meq/L (ref 19–32)
Creat: 0.9 mg/dL (ref 0.50–1.10)
Glucose, Bld: 86 mg/dL (ref 70–99)
Potassium: 4.4 mEq/L (ref 3.5–5.3)
SODIUM: 142 meq/L (ref 135–145)
Total Protein: 6.2 g/dL (ref 6.0–8.3)

## 2014-04-26 LAB — TSH: TSH: 4.266 u[IU]/mL (ref 0.350–4.500)

## 2014-05-04 ENCOUNTER — Encounter: Payer: Self-pay | Admitting: Family Medicine

## 2014-05-04 ENCOUNTER — Ambulatory Visit (INDEPENDENT_AMBULATORY_CARE_PROVIDER_SITE_OTHER): Payer: Medicare HMO | Admitting: Family Medicine

## 2014-05-04 VITALS — BP 128/74 | HR 76 | Temp 98.6°F | Resp 16 | Ht 63.5 in | Wt 190.0 lb

## 2014-05-04 DIAGNOSIS — J019 Acute sinusitis, unspecified: Secondary | ICD-10-CM

## 2014-05-04 DIAGNOSIS — I10 Essential (primary) hypertension: Secondary | ICD-10-CM

## 2014-05-04 DIAGNOSIS — E038 Other specified hypothyroidism: Secondary | ICD-10-CM

## 2014-05-04 MED ORDER — LEVOTHYROXINE SODIUM 112 MCG PO TABS: 112.0000 ug | ORAL_TABLET | Freq: Every day | ORAL | Status: DC

## 2014-05-04 MED ORDER — AZITHROMYCIN 250 MG PO TABS: ORAL_TABLET | ORAL | Status: DC

## 2014-05-04 MED ORDER — LOSARTAN POTASSIUM 100 MG PO TABS: ORAL_TABLET | ORAL | Status: DC

## 2014-05-04 MED ORDER — LEVOTHYROXINE SODIUM 100 MCG PO TABS: 100.0000 ug | ORAL_TABLET | Freq: Every day | ORAL | Status: DC

## 2014-05-04 NOTE — Progress Notes (Signed)
Subjective:    [name], female    DOB: 05-10-1946, 68 y.o.   MRN: [id number]  HPI Patient is here today for follow-up of her hypertension and hypothyroidism. She is currently taking levothyroxine 100 g every day alternating with 112 g. Her TSH is within therapeutic limits now. Her blood pressure is outstanding on the losartan. She denies any chest pain shortness of breath or dyspnea on exertion.  Her most recent lab work is listed below: Lab on 04/26/2014  Component Date Value Ref Range Status  . WBC 04/26/2014 5.3  4.0 - 10.5 K/uL Final  . RBC 04/26/2014 4.53  3.87 - 5.11 MIL/uL Final  . Hemoglobin 04/26/2014 14.0  12.0 - 15.0 g/dL Final  . HCT 04/26/2014 41.0  36.0 - 46.0 % Final  . MCV 04/26/2014 90.5  78.0 - 100.0 fL Final  . MCH 04/26/2014 30.9  26.0 - 34.0 pg Final  . MCHC 04/26/2014 34.1  30.0 - 36.0 g/dL Final  . RDW 04/26/2014 13.2  11.5 - 15.5 % Final  . Platelets 04/26/2014 248  150 - 400 K/uL Final  . Neutrophils Relative % 04/26/2014 52  43 - 77 % Final  . Neutro Abs 04/26/2014 2.8  1.7 - 7.7 K/uL Final  . Lymphocytes Relative 04/26/2014 33  12 - 46 % Final  . Lymphs Abs 04/26/2014 1.7  0.7 - 4.0 K/uL Final  . Monocytes Relative 04/26/2014 10  3 - 12 % Final  . Monocytes Absolute 04/26/2014 0.5  0.1 - 1.0 K/uL Final  . Eosinophils Relative 04/26/2014 4  0 - 5 % Final  . Eosinophils Absolute 04/26/2014 0.2  0.0 - 0.7 K/uL Final  . Basophils Relative 04/26/2014 1  0 - 1 % Final  . Basophils Absolute 04/26/2014 0.1  0.0 - 0.1 K/uL Final  . Smear Review 04/26/2014 Criteria for review not met   Final  . Cholesterol 04/26/2014 159  0 - 200 mg/dL Final   Comment: ATP III Classification:                                < 200        mg/dL        Desirable                               200 - 239     mg/dL        Borderline High                               >= 240        mg/dL        High                             . Triglycerides 04/26/2014 77  <150  mg/dL Final  . HDL 04/26/2014 48  >39 mg/dL Final  . Total CHOL/HDL Ratio 04/26/2014 3.3   Final  . VLDL 04/26/2014 15  0 - 40 mg/dL Final  . LDL Cholesterol 04/26/2014 96  0 - 99 mg/dL Final   Comment:                            Total Cholesterol/HDL Ratio:CHD  Risk                                                 Coronary Heart Disease Risk Table                                                                 Men       Women                                   1/2 Average Risk              3.4        3.3                                       Average Risk              5.0        4.4                                    2X Average Risk              9.6        7.1                                    3X Average Risk             23.4       11.0                          Use the calculated Patient Ratio above and the CHD Risk table                           to determine the patient's CHD Risk.                          ATP III Classification (LDL):                                < 100        mg/dL         Optimal                               100 - 129     mg/dL         Near or Above Optimal                               130 - 159     mg/dL  Borderline High                               160 - 189     mg/dL         High                                > 190        mg/dL         Very High                             . Sodium 04/26/2014 142  135 - 145 mEq/L Final  . Potassium 04/26/2014 4.4  3.5 - 5.3 mEq/L Final  . Chloride 04/26/2014 109  96 - 112 mEq/L Final  . CO2 04/26/2014 24  19 - 32 mEq/L Final  . Glucose, Bld 04/26/2014 86  70 - 99 mg/dL Final  . BUN 04/26/2014 13  6 - 23 mg/dL Final  . Creat 04/26/2014 0.90  0.50 - 1.10 mg/dL Final  . Total Bilirubin 04/26/2014 0.3  0.2 - 1.2 mg/dL Final  . Alkaline Phosphatase 04/26/2014 60  39 - 117 U/L Final  . AST 04/26/2014 19  0 - 37 U/L Final  . ALT 04/26/2014 18  0 - 35 U/L Final  . Total Protein 04/26/2014 6.2  6.0 - 8.3 g/dL Final  .  Albumin 04/26/2014 3.7  3.5 - 5.2 g/dL Final  . Calcium 04/26/2014 8.5  8.4 - 10.5 mg/dL Final  . TSH 04/26/2014 4.266  0.350 - 4.500 uIU/mL Final   Past Medical History  Diagnosis Date  . Hypertension   . Thyroid disease     hypothyroid   Past Surgical History  Procedure Laterality Date  . Abdominal hysterectomy    . Shoulder surgery Bilateral   . Knee surgery Left     11 surgery   . Cataract extraction     Current Outpatient Prescriptions on File Prior to Visit  Medication Sig Dispense Refill  . antiseptic oral rinse (BIOTENE) LIQD 15 mLs by Mouth Rinse route as needed.    Marland Kitchen aspirin 81 MG tablet Take 81 mg by mouth daily.    Marland Kitchen docusate sodium (COLACE) 100 MG capsule Take 100 mg by mouth 2 (two) times daily.    . Multiple Vitamins-Minerals (AIRBORNE PO) Take 2 tablets by mouth daily.    . fexofenadine (ALLEGRA) 180 MG tablet Take 1 tablet (180 mg total) by mouth daily. 90 tablet 4   No current facility-administered medications on file prior to visit.   Allergies  Allergen Reactions  . Cortizone-10 [Hydrocortisone]   . Penicillins   . Tylenol [Acetaminophen]    History   Social History  . Marital Status: Married    Spouse Name: N/A    Number of Children: N/A  . Years of Education: N/A   Occupational History  . Not on file.   Social History Main Topics  . Smoking status: Never Smoker   . Smokeless tobacco: Not on file  . Alcohol Use: Not on file  . Drug Use: Not on file  . Sexual Activity: Not on file   Other Topics Concern  . Not on file   Social History Narrative      Review of Systems  All other systems reviewed and are negative.  Objective:   Physical Exam  Constitutional: She appears well-developed and well-nourished.  HENT:  Nose: Nose normal.  Mouth/Throat: Oropharynx is clear and moist.  Neck: Neck supple. No thyromegaly present.  Cardiovascular: Normal rate, regular rhythm and normal heart sounds.   Pulmonary/Chest: Effort normal  and breath sounds normal. No respiratory distress. She has no wheezes. She has no rales.  Abdominal: Soft. Bowel sounds are normal. She exhibits no distension. There is no tenderness. There is no rebound and no guarding.  Lymphadenopathy:    She has no cervical adenopathy.  Vitals reviewed.         Assessment & Plan:  Other specified hypothyroidism  Benign essential HTN  Acute rhinosinusitis - Plan: azithromycin (ZITHROMAX) 250 MG tablet  Patient's blood pressure is excellent. Her thyroid is appropriately dosed. I recommended Prevnar 13 as well as a flu shot. Patient deferred that for now. She reports 3 weeks of frontal and maxillary sinus pain and pressure. She reports postnasal drip. She reports dull daily headaches and subjective fevers. On examination her right maxillary sinus is tender to palpation. Therefore I'll start the patient on a Z-Pak for sinusitis.

## 2014-06-07 ENCOUNTER — Encounter (HOSPITAL_COMMUNITY): Payer: Self-pay | Admitting: Adult Health

## 2014-06-07 ENCOUNTER — Emergency Department (HOSPITAL_COMMUNITY)
Admission: EM | Admit: 2014-06-07 | Discharge: 2014-06-07 | Disposition: A | Discharge status: Home / Self Care | Payer: Medicare HMO | Attending: Emergency Medicine | Admitting: Emergency Medicine

## 2014-06-07 ENCOUNTER — Emergency Department (HOSPITAL_COMMUNITY): Payer: Medicare HMO

## 2014-06-07 DIAGNOSIS — Z79899 Other long term (current) drug therapy: Secondary | ICD-10-CM | POA: Insufficient documentation

## 2014-06-07 DIAGNOSIS — I10 Essential (primary) hypertension: Secondary | ICD-10-CM | POA: Diagnosis not present

## 2014-06-07 DIAGNOSIS — Y9389 Activity, other specified: Secondary | ICD-10-CM | POA: Diagnosis not present

## 2014-06-07 DIAGNOSIS — E039 Hypothyroidism, unspecified: Secondary | ICD-10-CM | POA: Insufficient documentation

## 2014-06-07 DIAGNOSIS — W228XXA Striking against or struck by other objects, initial encounter: Secondary | ICD-10-CM | POA: Diagnosis not present

## 2014-06-07 DIAGNOSIS — Z792 Long term (current) use of antibiotics: Secondary | ICD-10-CM | POA: Insufficient documentation

## 2014-06-07 DIAGNOSIS — Y9289 Other specified places as the place of occurrence of the external cause: Secondary | ICD-10-CM | POA: Diagnosis not present

## 2014-06-07 DIAGNOSIS — Z7982 Long term (current) use of aspirin: Secondary | ICD-10-CM | POA: Insufficient documentation

## 2014-06-07 DIAGNOSIS — Y998 Other external cause status: Secondary | ICD-10-CM | POA: Diagnosis not present

## 2014-06-07 DIAGNOSIS — Z880 Allergy status to penicillin: Secondary | ICD-10-CM | POA: Insufficient documentation

## 2014-06-07 DIAGNOSIS — S93402A Sprain of unspecified ligament of left ankle, initial encounter: Secondary | ICD-10-CM | POA: Diagnosis not present

## 2014-06-07 MED ORDER — HYDROCODONE-ACETAMINOPHEN 5-325 MG PO TABS: 1.0000 | ORAL_TABLET | Freq: Four times a day (QID) | ORAL | Status: DC | PRN

## 2014-06-07 MED ORDER — NAPROXEN 250 MG PO TABS: 250.0000 mg | ORAL_TABLET | Freq: Two times a day (BID) | ORAL | Status: DC | PRN

## 2014-06-07 MED ORDER — HYDROCODONE-ACETAMINOPHEN 5-325 MG PO TABS
1.0000 | ORAL_TABLET | Freq: Once | ORAL | Status: AC
  Administered 2014-06-07: 1 via ORAL
  Filled 2014-06-07: qty 1

## 2014-06-07 NOTE — ED Provider Notes (Signed)
CSN: [id number]     Arrival date & time 06/07/14  2154 History  This chart was scribed for non-physician practitioner, Allen DerryMercedes Camprubi-Soms, PA-C working with Mirian MoMatthew Gentry, MD by Gwenyth Oberatherine Macek, ED scribe. This patient was seen in room TR11C/TR11C and the patient's care was started at 10:36 PM   Chief Complaint  Patient presents with  . Foot Pain   Patient is a 68 y.o. female presenting with lower extremity pain. The history is provided by the patient. No language interpreter was used.  Foot Pain This is a new problem. The current episode started 12 to 24 hours ago. The problem occurs constantly. The problem has not changed since onset.Pertinent negatives include no chest pain, no abdominal pain, no headaches and no shortness of breath. The symptoms are aggravated by walking and standing. Nothing relieves the symptoms. She has tried a cold compress and rest for the symptoms. The treatment provided no relief.    HPI Comments: Jill [name] is a 68 y.o. female who presents to the Emergency Department complaining of 6/10, sharp, constant left lateral ankle pain that radiates into her lateral foot and started 12 hours ago after she slipped on some water and twisted her ankle. She notes swelling around her lateral ankle as an associated symptom. Pt states pain becomes worse with weight bearing. She has tried ice with no relief. Pt endorses decreased ROM due to pain but denies weakness. She also denies numbness, tingling, and abrasions as associated symptoms.   Past Medical History  Diagnosis Date  . Hypertension   . Thyroid disease     hypothyroid   Past Surgical History  Procedure Laterality Date  . Abdominal hysterectomy    . Shoulder surgery Bilateral   . Knee surgery Left     11 surgery   . Cataract extraction     Family History  Problem Relation Age of Onset  . Heart disease Father   . Heart disease Brother   . Heart disease Paternal Grandfather   . Heart disease Brother     History  Substance Use Topics  . Smoking status: Never Smoker   . Smokeless tobacco: Not on file  . Alcohol Use: Not on file   OB History    No data available     Review of Systems  Respiratory: Negative for shortness of breath.   Cardiovascular: Negative for chest pain.  Gastrointestinal: Negative for abdominal pain.  Musculoskeletal: Positive for joint swelling, arthralgias and gait problem (painful). Negative for myalgias and back pain.  Skin: Negative for color change and wound.  Neurological: Negative for weakness, numbness and headaches.    10 Systems reviewed and all are negative for acute change except as noted in the HPI.    Allergies  Cortizone-10; Penicillins; and Tylenol  Home Medications   Prior to Admission medications   Medication Sig Start Date End Date Taking? Authorizing Provider  antiseptic oral rinse (BIOTENE) LIQD 15 mLs by Mouth Rinse route as needed.    Historical Provider, MD  aspirin 81 MG tablet Take 81 mg by mouth daily.    Historical Provider, MD  azithromycin (ZITHROMAX) 250 MG tablet 2 tabs poqday1, 1 tab poqday 2-5 05/04/14   Donita BrooksWarren T Pickard, MD  docusate sodium (COLACE) 100 MG capsule Take 100 mg by mouth 2 (two) times daily.    Historical Provider, MD  fexofenadine (ALLEGRA) 180 MG tablet Take 1 tablet (180 mg total) by mouth daily. 10/20/13   Donita BrooksWarren T Pickard, MD  levothyroxine (SYNTHROID,  LEVOTHROID) 100 MCG tablet Take 1 tablet (100 mcg total) by mouth daily. 05/04/14   Donita BrooksWarren T Pickard, MD  levothyroxine (SYNTHROID, LEVOTHROID) 112 MCG tablet Take 1 tablet (112 mcg total) by mouth daily before breakfast. 05/04/14   Donita BrooksWarren T Pickard, MD  losartan (COZAAR) 100 MG tablet Take one tablet by mouth one time daily 05/04/14   Donita BrooksWarren T Pickard, MD  Multiple Vitamins-Minerals (AIRBORNE PO) Take 2 tablets by mouth daily.    Historical Provider, MD   BP 151/73 mmHg  Pulse 79  Temp(Src) 97.6 F (36.4 C) (Oral)  Resp 18  Ht 5\' 1"  (1.549 m)  Wt 194  lb (87.998 kg)  BMI 36.67 kg/m2  SpO2 100% Physical Exam  Constitutional: She is oriented to person, place, and time. Vital signs are normal. She appears well-developed and well-nourished.  Non-toxic appearance. No distress.  HENT:  Head: Normocephalic and atraumatic.  Eyes: Conjunctivae and EOM are normal.  Neck: Normal range of motion. Neck supple.  Cardiovascular: Normal rate and intact distal pulses.   Distal pulses intact  Pulmonary/Chest: Effort normal. No respiratory distress.  Abdominal: Normal appearance. She exhibits no distension.  Musculoskeletal:       Left ankle: She exhibits decreased range of motion (due to pain) and swelling. She exhibits no deformity and normal pulse. Tenderness. Lateral malleolus tenderness found. Achilles tendon normal.       Feet:  L ankle with TTP to lateral malleolus with swelling, slightly diminished ROM due to pain, without deformity or bruising, no warmth or erythema, achilles intact. Distal pulses intact. Strength slightly diminished with dorsiflexion/plantarflexion due to pain. Sensation grossly intact. Cap refill brisk and present. Wiggles all toes well. Difficult with weight bearing  Neurological: She is alert and oriented to person, place, and time. No sensory deficit.  Skin: Skin is warm, dry and intact. No bruising and no rash noted. No erythema.  Psychiatric: She has a normal mood and affect. Her behavior is normal.  Nursing note and vitals reviewed.   ED Course  Procedures (including critical care time) DIAGNOSTIC STUDIES: Oxygen Saturation is 100% on RA, normal by my interpretation.    COORDINATION OF CARE: 10:42 PM Discussed treatment plan with pt which includes foot x-ray and pt agreed to plan.  Labs Review Labs Reviewed - No data to display  Imaging Review Dg Foot Complete Left  06/07/2014   CLINICAL DATA:  Left foot injury today  EXAM: LEFT FOOT - COMPLETE 3+ VIEW  COMPARISON:  None.  FINDINGS: Three views of the left foot  submitted. No acute fracture or subluxation. There is plantar spur of calcaneus.  IMPRESSION: No acute fracture or subluxation.  Plantar spurring of calcaneus.   Electronically Signed   By: Natasha MeadLiviu  Pop M.D.   On: 06/07/2014 22:49     EKG Interpretation None      MDM   Final diagnoses:  Ankle sprain, left, initial encounter    68 y.o. female with L ankle pain after twisting injury. Slight swelling and bony TTP. Neurovascularly intact with soft compartments. Xray obtained which was neg for fx/dislocation. Vicodin given here for pain, applied splint, given crutches, instructed to f/up with her orthopedist in 1-2wks for ongoing pain. Discussed RICE therapy. I explained the diagnosis and have given explicit precautions to return to the ER including for any other new or worsening symptoms. The patient understands and accepts the medical plan as it's been dictated and I have answered their questions. Discharge instructions concerning home care and prescriptions have  been given. The patient is STABLE and is discharged to home in good condition.   I personally performed the services described in this documentation, which was scribed in my presence. The recorded information has been reviewed and is accurate.  BP 151/73 mmHg  Pulse 79  Temp(Src) 97.6 F (36.4 C) (Oral)  Resp 18  Ht 5\' 1"  (1.549 m)  Wt 194 lb (87.998 kg)  BMI 36.67 kg/m2  SpO2 100%  Meds ordered this encounter  Medications  . HYDROcodone-acetaminophen (NORCO/VICODIN) 5-325 MG per tablet 1 tablet    Sig:   . naproxen (NAPROSYN) 250 MG tablet    Sig: Take 1 tablet (250 mg total) by mouth 2 (two) times daily as needed for mild pain, moderate pain or headache (TAKE WITH MEALS.).    Dispense:  20 tablet    Refill:  0    Order Specific Question:  Supervising Provider    Answer:  Eber Hong D [[id number]]  . HYDROcodone-acetaminophen (NORCO) 5-325 MG per tablet    Sig: Take 1 tablet by mouth every 6 (six) hours as needed for severe  pain.    Dispense:  6 tablet    Refill:  0    Order Specific Question:  Supervising Provider    Answer:  Vida Roller [address] [place], PA-C 06/07/14 [id number]  Mirian Mo, MD 06/12/14 [phone]

## 2014-06-07 NOTE — ED Notes (Signed)
Presents with left foot and ankle pain after slipping on water and injuring side of left foot, swelling noted, CMS intact

## 2014-06-07 NOTE — Discharge Instructions (Signed)
Wear ankle brace for at least 2 weeks for stabilization of ankle. Use crutches as needed for comfort. Ice and elevate ankle throughout the day. Alternate between naprosyn and vicodin for pain relief. Do not drive or operate machinery with pain medication use. Call your orthopedist tomorrow to schedule followup appointment for recheck of ongoing ankle pain in 2 weeks that can be canceled with a 24-48 hour notice if complete resolution of pain. Return to the ER for changes or worsening symptoms.    Ankle Sprain An ankle sprain is an injury to the strong, fibrous tissues (ligaments) that hold your ankle bones together.  HOME CARE   Put ice on your ankle for 1-2 days or as told by your doctor.  Put ice in a plastic bag.  Place a towel between your skin and the bag.  Leave the ice on for 15-20 minutes at a time, every 2 hours while you are awake.  Only take medicine as told by your doctor.  Raise (elevate) your injured ankle above the level of your heart as much as possible for 2-3 days.  Use crutches if your doctor tells you to. Slowly put your own weight on the affected ankle. Use the crutches until you can walk without pain.  If you have a plaster splint:  Do not rest it on anything harder than a pillow for 24 hours.  Do not put weight on it.  Do not get it wet.  Take it off to shower or bathe.  If given, use an elastic wrap or support stocking for support. Take the wrap off if your toes lose feeling (numb), tingle, or turn cold or blue.  If you have an air splint:  Add or let out air to make it comfortable.  Take it off at night and to shower and bathe.  Wiggle your toes and move your ankle up and down often while you are wearing it. GET HELP IF:  You have rapidly increasing bruising or puffiness (swelling).  Your toes feel very cold.  You lose feeling in your foot.  Your medicine does not help your pain. GET HELP RIGHT AWAY IF:   Your toes lose feeling (numb) or  turn blue.  You have severe pain that is increasing. MAKE SURE YOU:   Understand these instructions.  Will watch your condition.  Will get help right away if you are not doing well or get worse. Document Released: 12/03/2007 Document Revised: 10/31/2013 Document Reviewed: 12/29/2011 [facility] Patient Information 2015 [place], [place]. This information is not intended to replace advice given to you by your health care provider. Make sure you discuss any questions you have with your health care provider.  Cryotherapy Cryotherapy means treatment with cold. Ice or gel packs can be used to reduce both pain and swelling. Ice is the most helpful within the first 24 to 48 hours after an injury or flare-up from overusing a muscle or joint. Sprains, strains, spasms, burning pain, shooting pain, and aches can all be eased with ice. Ice can also be used when recovering from surgery. Ice is effective, has very few side effects, and is safe for most people to use. PRECAUTIONS  Ice is not a safe treatment option for people with:  Raynaud phenomenon. This is a condition affecting small blood vessels in the extremities. Exposure to cold may cause your problems to return.  Cold hypersensitivity. There are many forms of cold hypersensitivity, including:  Cold urticaria. Red, itchy hives appear on the skin when the  tissues begin to warm after being iced.  Cold erythema. This is a red, itchy rash caused by exposure to cold.  Cold hemoglobinuria. Red blood cells break down when the tissues begin to warm after being iced. The hemoglobin that carry oxygen are passed into the urine because they cannot combine with blood proteins fast enough.  Numbness or altered sensitivity in the area being iced. If you have any of the following conditions, do not use ice until you have discussed cryotherapy with your caregiver:  Heart conditions, such as arrhythmia, angina, or chronic heart disease.  High blood  pressure.  Healing wounds or open skin in the area being iced.  Current infections.  Rheumatoid arthritis.  Poor circulation.  Diabetes. Ice slows the blood flow in the region it is applied. This is beneficial when trying to stop inflamed tissues from spreading irritating chemicals to surrounding tissues. However, if you expose your skin to cold temperatures for too long or without the proper protection, you can damage your skin or nerves. Watch for signs of skin damage due to cold. HOME CARE INSTRUCTIONS Follow these tips to use ice and cold packs safely.  Place a dry or damp towel between the ice and skin. A damp towel will cool the skin more quickly, so you may need to shorten the time that the ice is used.  For a more rapid response, add gentle compression to the ice.  Ice for no more than 10 to 20 minutes at a time. The bonier the area you are icing, the less time it will take to get the benefits of ice.  Check your skin after 5 minutes to make sure there are no signs of a poor response to cold or skin damage.  Rest 20 minutes or more between uses.  Once your skin is numb, you can end your treatment. You can test numbness by very lightly touching your skin. The touch should be so light that you do not see the skin dimple from the pressure of your fingertip. When using ice, most people will feel these normal sensations in this order: cold, burning, aching, and numbness.  Do not use ice on someone who cannot communicate their responses to pain, such as small children or people with dementia. HOW TO MAKE AN ICE PACK Ice packs are the most common way to use ice therapy. Other methods include ice massage, ice baths, and cryosprays. Muscle creams that cause a cold, tingly feeling do not offer the same benefits that ice offers and should not be used as a substitute unless recommended by your caregiver. To make an ice pack, do one of the following:  Place crushed ice or a bag of frozen  vegetables in a sealable plastic bag. Squeeze out the excess air. Place this bag inside another plastic bag. Slide the bag into a pillowcase or place a damp towel between your skin and the bag.  Mix 3 parts water with 1 part rubbing alcohol. Freeze the mixture in a sealable plastic bag. When you remove the mixture from the freezer, it will be slushy. Squeeze out the excess air. Place this bag inside another plastic bag. Slide the bag into a pillowcase or place a damp towel between your skin and the bag. SEEK MEDICAL CARE IF:  You develop white spots on your skin. This may give the skin a blotchy (mottled) appearance.  Your skin turns blue or pale.  Your skin becomes waxy or hard.  Your swelling gets worse. MAKE SURE  YOU:   Understand these instructions.  Will watch your condition.  Will get help right away if you are not doing well or get worse. Document Released: 02/10/2011 Document Revised: 10/31/2013 Document Reviewed: 02/10/2011 [facility] Patient Information 2015 [place]. This information is not intended to replace advice given to you by your health care provider. Make sure you discuss any questions you have with your health care provider.

## 2014-06-15 ENCOUNTER — Ambulatory Visit (INDEPENDENT_AMBULATORY_CARE_PROVIDER_SITE_OTHER): Payer: Medicare HMO | Admitting: Family Medicine

## 2014-06-15 ENCOUNTER — Encounter: Payer: Self-pay | Admitting: Family Medicine

## 2014-06-15 ENCOUNTER — Ambulatory Visit: Payer: Medicare HMO | Admitting: Family Medicine

## 2014-06-15 DIAGNOSIS — S93402D Sprain of unspecified ligament of left ankle, subsequent encounter: Secondary | ICD-10-CM

## 2014-06-15 MED ORDER — DICLOFENAC SODIUM 75 MG PO TBEC: 75.0000 mg | DELAYED_RELEASE_TABLET | Freq: Two times a day (BID) | ORAL | Status: DC

## 2014-06-15 MED ORDER — HYDROCODONE-ACETAMINOPHEN 5-325 MG PO TABS: 1.0000 | ORAL_TABLET | Freq: Four times a day (QID) | ORAL | Status: DC | PRN

## 2014-06-16 ENCOUNTER — Encounter: Payer: Self-pay | Admitting: Family Medicine

## 2014-06-16 NOTE — Progress Notes (Signed)
Subjective:    [name], female    DOB: 09/12/1945, 68 y.o.   MRN: [id number]  HPI Patient suffered an inversion ankle injury approximately 1 week ago. She went to the emergency room. I have reviewed the x-rays personally from the emergency room. There is no evidence of fracture or dislocation. There was significant soft tissue swelling over the lateral malleolus. Patient is extremely tender in the area overlying the ATFL.  Patient reports pain with weightbearing. She is able to plantarflex her ankle approximately 5 and dorsiflex the ankle approximately 5. There is still moderate swelling in the left ankle. She has been wearing an Aircast and using crutches to ambulate. There is no bruising or ecchymosis.  There is no erythema or warmth. Past Medical History  Diagnosis Date  . Hypertension   . Thyroid disease     hypothyroid   Past Surgical History  Procedure Laterality Date  . Abdominal hysterectomy    . Shoulder surgery Bilateral   . Knee surgery Left     11 surgery   . Cataract extraction     Current Outpatient Prescriptions on File Prior to Visit  Medication Sig Dispense Refill  . antiseptic oral rinse (BIOTENE) LIQD 15 mLs by Mouth Rinse route as needed.    Marland Kitchen. aspirin 81 MG tablet Take 81 mg by mouth daily.    Marland Kitchen. azithromycin (ZITHROMAX) 250 MG tablet 2 tabs poqday1, 1 tab poqday 2-5 6 tablet 0  . docusate sodium (COLACE) 100 MG capsule Take 100 mg by mouth 2 (two) times daily.    . fexofenadine (ALLEGRA) 180 MG tablet Take 1 tablet (180 mg total) by mouth daily. 90 tablet 4  . levothyroxine (SYNTHROID, LEVOTHROID) 100 MCG tablet Take 1 tablet (100 mcg total) by mouth daily. 90 tablet 3  . levothyroxine (SYNTHROID, LEVOTHROID) 112 MCG tablet Take 1 tablet (112 mcg total) by mouth daily before breakfast. 90 tablet 3  . losartan (COZAAR) 100 MG tablet Take one tablet by mouth one time daily 90 tablet 4  . Multiple Vitamins-Minerals (AIRBORNE PO) Take 2  tablets by mouth daily.    . naproxen (NAPROSYN) 250 MG tablet Take 1 tablet (250 mg total) by mouth 2 (two) times daily as needed for mild pain, moderate pain or headache (TAKE WITH MEALS.). 20 tablet 0   No current facility-administered medications on file prior to visit.   Allergies  Allergen Reactions  . Cortizone-10 [Hydrocortisone]   . Penicillins   . Tylenol [Acetaminophen]    History   Social History  . Marital Status: Married    Spouse Name: N/A    Number of Children: N/A  . Years of Education: N/A   Occupational History  . Not on file.   Social History Main Topics  . Smoking status: Never Smoker   . Smokeless tobacco: Not on file  . Alcohol Use: Not on file  . Drug Use: Not on file  . Sexual Activity: Not on file   Other Topics Concern  . Not on file   Social History Narrative      Review of Systems  All other systems reviewed and are negative.      Objective:   Physical Exam  Cardiovascular: Normal rate, regular rhythm and normal heart sounds.   Pulmonary/Chest: Effort normal and breath sounds normal. No respiratory distress. She has no wheezes. She has no rales.  Musculoskeletal:       Left ankle: She exhibits decreased range of motion and  swelling. She exhibits no ecchymosis, no deformity, no laceration and normal pulse. Tenderness. Lateral malleolus, AITFL, posterior TFL and proximal fibula tenderness found. No medial malleolus, no CF ligament and no head of 5th metatarsal tenderness found. Achilles tendon exhibits no defect and normal Thompson's test results.  Vitals reviewed.         Assessment & Plan:  Sprain of ankle, left, subsequent encounter - Plan: HYDROcodone-acetaminophen (NORCO) 5-325 MG per tablet, diclofenac (VOLTAREN) 75 MG EC tablet  Patient has suffered a severe sprain to her ankle. I have recommended rest ice elevation. I recommended that she continue to wear the Aircast. I recommended nonweightbearing for 1 more week and then  touchdown weightbearing if her pain is improving. She can use hydrocodone at night to help her sleep. I recommended diclofenac 75 mg by mouth twice a day for swelling and pain. Recheck in 2 weeks. Patient already has an appointment to see the orthopedist in 2 weeks.

## 2014-10-06 ENCOUNTER — Ambulatory Visit (INDEPENDENT_AMBULATORY_CARE_PROVIDER_SITE_OTHER): Payer: Medicare HMO | Admitting: Family Medicine

## 2014-10-06 ENCOUNTER — Encounter: Payer: Self-pay | Admitting: Family Medicine

## 2014-10-06 VITALS — BP 132/72 | HR 87 | Temp 100.0°F | Resp 16 | Wt 191.0 lb

## 2014-10-06 DIAGNOSIS — J988 Other specified respiratory disorders: Secondary | ICD-10-CM

## 2014-10-06 DIAGNOSIS — J0100 Acute maxillary sinusitis, unspecified: Secondary | ICD-10-CM | POA: Diagnosis not present

## 2014-10-06 MED ORDER — GUAIFENESIN-CODEINE 100-10 MG/5ML PO SOLN: 5.0000 mL | Freq: Four times a day (QID) | ORAL | Status: DC | PRN

## 2014-10-06 MED ORDER — LEVOFLOXACIN 500 MG PO TABS: 500.0000 mg | ORAL_TABLET | Freq: Every day | ORAL | Status: DC

## 2014-10-06 NOTE — Progress Notes (Signed)
[name], female   DOB: [birth year], 69 y.o.   MRN: [id number]   Subjective:    [name], female    DOB: [birth year], 69 y.o.   MRN: [id number]  Patient presents for Cough  patient here with fever cough with production for the past 2 days. Also has sinus pressure/draiange. She's had some blood tinged sputum. Initially she had some body aches but this is now resolved and she just has cough and some rib pain from coughing so much. She states it feels similar to when she had pneumonia before. She states she has had multiple episodes of pneumonia in the past. She has been using some Mucinex she recently returned from a trip at the beach. She denies any nausea vomiting or diarrhea. No known sick contacts    Review Of Systems:  GEN- +fatigue, +fever, weight loss,weakness, recent illness HEENT- denies eye drainage, change in vision, +nasal discharge, CVS- denies chest pain, palpitations RESP- denies SOB, +cough, +wheeze ABD- denies N/V, change in stools, abd pain GU- denies dysuria, hematuria, dribbling, incontinence MSK- denies joint pain, +muscle aches, injury Neuro- + headache, dizziness, syncope, seizure activity       Objective:    BP 132/72 mmHg  Pulse 87  Temp(Src) 100 F (37.8 C) (Oral)  Resp 16  Wt 191 lb (86.637 kg)  SpO2 97% GEN- NAD, alert and oriented x3,febrile HEENT- PERRL, EOMI, non injected sclera, pink conjunctiva, MMM, oropharynx clear, +maxillary sinus tenderness, TM clear bilat no effusion, nares clear rhinorrhea Neck- Supple, shotty LAD CVS- RRR, no murmur RESP-mild rhonchi bilat, good air movement, no wheeze or bronchospasm,  EXT- No edema Pulses- Radial 2+        Assessment & Plan:      Problem List Items Addressed This Visit    None    Visit Diagnoses    Respiratory infection    -  Primary    There is a good probability that this is viral however with her history of recurrent pneumonia in her current  presentation I'm going to treat her with antibiotics, she has similar symptoms as previous PNA and blood tinged sputum, oxygen sats are normal. Levaquin, robitussin AC, fluids, CXR if no improvement or go to ER if she gets worse    Acute maxillary sinusitis, recurrence not specified        Relevant Medications    levofloxacin (LEVAQUIN) tablet    guaiFENesin-codeine solution       Note: This dictation was prepared with Dragon dictation along with smaller phrase technology. Any transcriptional errors that result from this process are unintentional.

## 2014-10-06 NOTE — Patient Instructions (Addendum)
Take antibiotics as prescribed Robitussin with codiene Plenty of fluids and rest F/U as needed

## 2014-11-16 ENCOUNTER — Telehealth: Payer: Self-pay | Admitting: Family Medicine

## 2014-11-16 MED ORDER — LEVOTHYROXINE SODIUM 100 MCG PO TABS: 100.0000 ug | ORAL_TABLET | Freq: Every day | ORAL | Status: DC

## 2014-11-16 MED ORDER — LOSARTAN POTASSIUM 100 MG PO TABS
ORAL_TABLET | ORAL | Status: DC
Start: 2014-11-16 — End: 2016-12-04

## 2014-11-16 MED ORDER — LEVOTHYROXINE SODIUM 112 MCG PO TABS: 112.0000 ug | ORAL_TABLET | Freq: Every day | ORAL | Status: DC

## 2014-11-16 NOTE — Telephone Encounter (Signed)
Looks like the med may be losartan she is needing will send to pharm - Medication called/sent to requested pharmacy

## 2014-11-16 NOTE — Telephone Encounter (Signed)
Patient calling for refill of levothyroxine sodium 112mcg and levothyroxine sodium 100mcg, and another med that i couldn't understand why patient was asking for to be called to [facility] on [address] at [phone]

## 2014-12-27 ENCOUNTER — Encounter: Payer: Self-pay | Admitting: Family Medicine

## 2014-12-27 ENCOUNTER — Ambulatory Visit (INDEPENDENT_AMBULATORY_CARE_PROVIDER_SITE_OTHER): Payer: Medicare HMO | Admitting: Family Medicine

## 2014-12-27 VITALS — BP 132/62 | HR 72 | Temp 97.9°F | Resp 14 | Ht 64.0 in | Wt 190.0 lb

## 2014-12-27 DIAGNOSIS — T148 Other injury of unspecified body region: Secondary | ICD-10-CM | POA: Diagnosis not present

## 2014-12-27 DIAGNOSIS — T148XXA Other injury of unspecified body region, initial encounter: Secondary | ICD-10-CM

## 2014-12-27 NOTE — Patient Instructions (Signed)
Keep area clean and dry Call for any signs of infection F/U as needed

## 2014-12-28 NOTE — Progress Notes (Signed)
Patient ID: Jill PhoenixMargaret L Propes, female   DOB: 02-Apr-1946, 69 y.o.   MRN: [id number]   Subjective:    [name], female    DOB: 02-Apr-1946, 69 y.o.   MRN: [id number]  Patient presents for Hard knot on pinky finger   Pt here with blood blister on 5th digit of left hand. Has has many years ago, and had it lanced due to pain, current present for past few weeks, painful when she bends her finger. No known cause of injury, no drainage or redness from area. Also requested handicap form   Review Of Systems:  GEN- denies fatigue, fever, weight loss,weakness, recent illness HEENT- denies eye drainage, change in vision, nasal discharge, CVS- denies chest pain, palpitations RESP- denies SOB, cough, wheeze ABD- denies N/V, change in stools, abd pain GU- denies dysuria, hematuria, dribbling, incontinence MSK- + joint pain, muscle aches, injury Neuro- denies headache, dizziness, syncope, seizure activity       Objective:    BP 132/62 mmHg  Pulse 72  Temp(Src) 97.9 F (36.6 C) (Oral)  Resp 14  Ht 5\' 4"  (1.626 m)  Wt 190 lb (86.183 kg)  BMI 32.60 kg/m2 GEN- NAD, alert and oriented x3 MSK- left hand- FROM wrist, finger joints, no swelling noted, no nodules Skin- left hand 5th digit just below MIP- blood blister TTP, approx 3-314mm  Procedure- Incision and Drainage Procedure explained to patient questions answered benefits and risks discussed verbal consent obtained. Antiseptic-Betadine Anesthesia-lidocaine 1% Incision performed over blister- dark blood released Minimal blood loss Patient tolerated procedure well Bandage applied         Assessment & Plan:      Problem List Items Addressed This Visit    None    Visit Diagnoses    Blood blister    -  Primary    I and D , removed old blood, nodule resolved, pt pain free at end of visit. Handicap form given       Note: This dictation was prepared with Dragon dictation along with smaller phrase technology.  Any transcriptional errors that result from this process are unintentional.

## 2015-01-16 ENCOUNTER — Encounter: Payer: Self-pay | Admitting: Family Medicine

## 2015-01-16 ENCOUNTER — Ambulatory Visit (INDEPENDENT_AMBULATORY_CARE_PROVIDER_SITE_OTHER): Payer: Medicare HMO | Admitting: Family Medicine

## 2015-01-16 VITALS — BP 104/62 | HR 74 | Temp 98.1°F | Resp 16 | Ht 63.5 in | Wt 192.0 lb

## 2015-01-16 DIAGNOSIS — M79645 Pain in left finger(s): Secondary | ICD-10-CM

## 2015-01-16 MED ORDER — DICLOFENAC SODIUM 1 % TD GEL: 2.0000 g | Freq: Four times a day (QID) | TRANSDERMAL | Status: DC

## 2015-01-16 NOTE — Progress Notes (Signed)
Subjective:    Patient ID: Jill PhoenixMargaret L Reddish, female    DOB: 30-Jun-1946, 69 y.o.   MRN: [id number]  HPI Patient has a unique story. There is a small 2 mm subcutaneous nodule on her left hand on the volar surface of the fifth PIP joint. It is freely mobile. It is not attached to the bone. It appears to be attached to the flexor tendon. There is no locking of the PIP joint of the DIP joint. However this nodule is exquisitely tender to palpation. Pain is out of proportion to exam. Is almost like nerve pain. There is no erythema or swelling in the finger. She has normal range of motion in the PIP and DIP joints as well as the MCP joint. To palpation it feels almost like a trigger finger nodule without locking. Past Medical History  Diagnosis Date  . Hypertension   . Thyroid disease     hypothyroid   Past Surgical History  Procedure Laterality Date  . Abdominal hysterectomy    . Shoulder surgery Bilateral   . Knee surgery Left     11 surgery   . Cataract extraction     Current Outpatient Prescriptions on File Prior to Visit  Medication Sig Dispense Refill  . antiseptic oral rinse (BIOTENE) LIQD 15 mLs by Mouth Rinse route as needed.    Marland Kitchen. aspirin 81 MG tablet Take 81 mg by mouth daily.    Marland Kitchen. docusate sodium (COLACE) 100 MG capsule Take 100 mg by mouth 2 (two) times daily.    . fexofenadine (ALLEGRA) 180 MG tablet Take 1 tablet (180 mg total) by mouth daily. 90 tablet 4  . levothyroxine (SYNTHROID, LEVOTHROID) 100 MCG tablet Take 1 tablet (100 mcg total) by mouth daily. (Patient taking differently: Take 100 mcg by mouth daily. Alternates QOD) 90 tablet 3  . levothyroxine (SYNTHROID, LEVOTHROID) 112 MCG tablet Take 1 tablet (112 mcg total) by mouth daily before breakfast. (Patient taking differently: Take 112 mcg by mouth daily before breakfast. Alternates QOD) 90 tablet 3  . losartan (COZAAR) 100 MG tablet Take one tablet by mouth one time daily 90 tablet 3  . Multiple Vitamins-Minerals  (AIRBORNE PO) Take 2 tablets by mouth daily.    . naproxen (NAPROSYN) 250 MG tablet Take 1 tablet (250 mg total) by mouth 2 (two) times daily as needed for mild pain, moderate pain or headache (TAKE WITH MEALS.). (Patient not taking: Reported on 01/16/2015) 20 tablet 0   No current facility-administered medications on file prior to visit.   Allergies  Allergen Reactions  . Cortizone-10 [Hydrocortisone]   . Penicillins   . Tylenol [Acetaminophen]    History   Social History  . Marital Status: Married    Spouse Name: N/A  . Number of Children: N/A  . Years of Education: N/A   Occupational History  . Not on file.   Social History Main Topics  . Smoking status: Never Smoker   . Smokeless tobacco: Not on file  . Alcohol Use: Not on file  . Drug Use: Not on file  . Sexual Activity: Not on file   Other Topics Concern  . Not on file   Social History Narrative      Review of Systems  All other systems reviewed and are negative.      Objective:   Physical Exam  Cardiovascular: Normal rate, regular rhythm and normal heart sounds.   Pulmonary/Chest: Effort normal and breath sounds normal.  Vitals reviewed. please see the description  in the history of present illness        Assessment & Plan:  Finger pain, left - Plan: diclofenac sodium (VOLTAREN) 1 % GEL  My suspicion is a fibrous nodule in the flexor tendon versus some type of neuroma. Patient is allergic to cortisone and therefore I cannot perform a cortisone injection. Therefore I will try Voltaren gel 2 g 4 times a day for 2 weeks. If no better consider adding lidocaine gel and if no better at that point consider referral to a hand surgeon

## 2015-02-26 ENCOUNTER — Ambulatory Visit (INDEPENDENT_AMBULATORY_CARE_PROVIDER_SITE_OTHER): Payer: Medicare HMO | Admitting: Family Medicine

## 2015-02-26 ENCOUNTER — Encounter: Payer: Self-pay | Admitting: Family Medicine

## 2015-02-26 VITALS — BP 108/62 | HR 82 | Temp 99.1°F | Resp 18 | Ht 63.5 in | Wt 190.0 lb

## 2015-02-26 DIAGNOSIS — M25511 Pain in right shoulder: Secondary | ICD-10-CM | POA: Diagnosis not present

## 2015-02-26 DIAGNOSIS — J309 Allergic rhinitis, unspecified: Secondary | ICD-10-CM

## 2015-02-26 MED ORDER — PREDNISONE 20 MG PO TABS: ORAL_TABLET | ORAL | Status: DC

## 2015-02-26 NOTE — Progress Notes (Signed)
Subjective:    [name], female    DOB: 1945-12-08, 69 y.o.   MRN: [id number]  HPI Patient reports a one-week history of sinus drainage, wheezing, cough productive of green thick sputum, sinus pressure. She denies any sinus pain. She denies any fevers or chills. She does report itchy watery eyes and sneezing. All of her symptoms began after she returned to the area and cut her grass. She is tried Claritin-D with no benefit. She is tried [employer] with no benefit. She denies any shortness of breath. She denies any chest pain. She denies any hemoptysis.  She also reports pain in her right shoulder off and on for a year. However recently over the last month it is dramatically worsened. She is unable to abduct her shoulder greater than 80. She has pain with passive range of motion in the shoulder greater than 90. She has a positive empty can sign. She has a positive Hawkins sign. She has pain with internal and external rotation. I recommended a cortisone shot in the shoulder today but the patient declined due to the fact she's had a severe reaction to cortisone injections in the past. I question the patient about this further but she states that she can take prednisone pills and that she is taking them in the past without problems. Past Medical History  Diagnosis Date  . Hypertension   . Thyroid disease     hypothyroid   Past Surgical History  Procedure Laterality Date  . Abdominal hysterectomy    . Shoulder surgery Bilateral   . Knee surgery Left     11 surgery   . Cataract extraction     Current Outpatient Prescriptions on File Prior to Visit  Medication Sig Dispense Refill  . antiseptic oral rinse (BIOTENE) LIQD 15 mLs by Mouth Rinse route as needed.    Marland Kitchen aspirin 81 MG tablet Take 81 mg by mouth daily.    . diclofenac sodium (VOLTAREN) 1 % GEL Apply 2 g topically 4 (four) times daily. 100 g 0  . docusate sodium (COLACE) 100 MG capsule Take 100 mg by mouth 2 (two)  times daily.    . fexofenadine (ALLEGRA) 180 MG tablet Take 1 tablet (180 mg total) by mouth daily. 90 tablet 4  . levothyroxine (SYNTHROID, LEVOTHROID) 100 MCG tablet Take 1 tablet (100 mcg total) by mouth daily. (Patient taking differently: Take 100 mcg by mouth daily. Alternates QOD) 90 tablet 3  . levothyroxine (SYNTHROID, LEVOTHROID) 112 MCG tablet Take 1 tablet (112 mcg total) by mouth daily before breakfast. (Patient taking differently: Take 112 mcg by mouth daily before breakfast. Alternates QOD) 90 tablet 3  . losartan (COZAAR) 100 MG tablet Take one tablet by mouth one time daily 90 tablet 3  . Multiple Vitamins-Minerals (AIRBORNE PO) Take 2 tablets by mouth daily.    . naproxen (NAPROSYN) 250 MG tablet Take 1 tablet (250 mg total) by mouth 2 (two) times daily as needed for mild pain, moderate pain or headache (TAKE WITH MEALS.). 20 tablet 0   No current facility-administered medications on file prior to visit.   Allergies  Allergen Reactions  . Cortizone-10 [Hydrocortisone]   . Penicillins   . Tylenol [Acetaminophen]    Social History   Social History  . Marital Status: Married    Spouse Name: N/A  . Number of Children: N/A  . Years of Education: N/A   Occupational History  . Not on file.   Social History Main Topics  .  Smoking status: Never Smoker   . Smokeless tobacco: Not on file  . Alcohol Use: Not on file  . Drug Use: Not on file  . Sexual Activity: Not on file   Other Topics Concern  . Not on file   Social History Narrative      Review of Systems  All other systems reviewed and are negative.      Objective:   Physical Exam  HENT:  Right Ear: External ear normal.  Left Ear: External ear normal.  Nose: Mucosal edema and rhinorrhea present. Right sinus exhibits no maxillary sinus tenderness and no frontal sinus tenderness. Left sinus exhibits no maxillary sinus tenderness and no frontal sinus tenderness.  Mouth/Throat: Oropharynx is clear and moist.  No oropharyngeal exudate.  Neck: Normal range of motion.  Cardiovascular: Normal rate, regular rhythm and normal heart sounds.   Pulmonary/Chest: Effort normal. She has wheezes.  Lymphadenopathy:    She has no cervical adenopathy.  Vitals reviewed.         Assessment & Plan:  Allergic sinusitis - Plan: predniSONE (DELTASONE) 20 MG tablet  Right shoulder pain  Patient has allergic rhinosinusitis. I will give the patient a prednisone taper pack. I believe she also has rotator cuff tendinitis and impingement syndrome in her right shoulder. I am hopeful that the prednisone will help with this regards as well. If her right shoulder pain is not improving over the next week or so, I would proceed with an x-ray of the right shoulder. If she truly cannot tolerate a cortisone injection in the shoulder, we can try physical therapy and if no improvement after physical therapy proceed with an MRI

## 2015-05-23 ENCOUNTER — Telehealth: Payer: Self-pay | Admitting: Family Medicine

## 2015-05-23 DIAGNOSIS — M25511 Pain in right shoulder: Secondary | ICD-10-CM

## 2015-05-23 NOTE — Telephone Encounter (Signed)
Pt aware needs to go have xray done first and PT, pt states [place] and will need the MRI before Jan. I advised pt to go ahead with the xray and wait on results.

## 2015-05-23 NOTE — Telephone Encounter (Signed)
PATIENT IS CALLING TO SAY SHE IS READY TO GO AHEAD AND SCHEDULE THE MRI THAT WAS DISCUSSED WITH DR PICKARD  ON HER SHOULDER  PLEASE CALL HER AT [phone] IF ANY QUESTIONS

## 2015-05-23 NOTE — Telephone Encounter (Signed)
Proceed with xray and pt.

## 2015-05-23 NOTE — Telephone Encounter (Signed)
Xray ordered/ PT ordered

## 2015-05-23 NOTE — Telephone Encounter (Signed)
?   Ok with referral   Last ov on 02/26/15 stated would proceed with an x-ray of the right shoulder. If she truly cannot tolerate a cortisone injection in the shoulder, we can try physical therapy and if no improvement after physical therapy proceed with an MRI  I do not see where pt had PT, can we try PT first or go ahead with MRI. Please advise!

## 2015-05-28 ENCOUNTER — Ambulatory Visit
Admission: RE | Admit: 2015-05-28 | Discharge: 2015-05-28 | Disposition: A | Discharge status: Home / Self Care | Payer: Medicare HMO | Source: Ambulatory Visit | Attending: Family Medicine | Admitting: Family Medicine

## 2015-05-28 DIAGNOSIS — M25511 Pain in right shoulder: Secondary | ICD-10-CM | POA: Diagnosis not present

## 2015-05-31 ENCOUNTER — Ambulatory Visit: Payer: Medicare HMO | Attending: Family Medicine | Admitting: Physical Therapy

## 2015-05-31 DIAGNOSIS — R293 Abnormal posture: Secondary | ICD-10-CM | POA: Diagnosis not present

## 2015-05-31 DIAGNOSIS — M25511 Pain in right shoulder: Secondary | ICD-10-CM

## 2015-05-31 DIAGNOSIS — M25611 Stiffness of right shoulder, not elsewhere classified: Secondary | ICD-10-CM

## 2015-05-31 DIAGNOSIS — R6889 Other general symptoms and signs: Secondary | ICD-10-CM

## 2015-05-31 NOTE — Patient Instructions (Signed)
Cryotherapy Cryotherapy means treatment with cold. Ice or gel packs can be used to reduce both pain and swelling. Ice is the most helpful within the first 24 to 48 hours after an injury or flare-up from overusing a muscle or joint. Sprains, strains, spasms, burning pain, shooting pain, and aches can all be eased with ice. Ice can also be used when recovering from surgery. Ice is effective, has very few side effects, and is safe for most people to use. PRECAUTIONS  Ice is not a safe treatment option for people with:  Raynaud phenomenon. This is a condition affecting small blood vessels in the extremities. Exposure to cold may cause your problems to return.  Cold hypersensitivity. There are many forms of cold hypersensitivity, including:  Cold urticaria. Red, itchy hives appear on the skin when the tissues begin to warm after being iced.  Cold erythema. This is a red, itchy rash caused by exposure to cold.  Cold hemoglobinuria. Red blood cells break down when the tissues begin to warm after being iced. The hemoglobin that carry oxygen are passed into the urine because they cannot combine with blood proteins fast enough.  Numbness or altered sensitivity in the area being iced. If you have any of the following conditions, do not use ice until you have discussed cryotherapy with your caregiver:  Heart conditions, such as arrhythmia, angina, or chronic heart disease.  High blood pressure.  Healing wounds or open skin in the area being iced.  Current infections.  Rheumatoid arthritis.  Poor circulation.  Diabetes. Ice slows the blood flow in the region it is applied. This is beneficial when trying to stop inflamed tissues from spreading irritating chemicals to surrounding tissues. However, if you expose your skin to cold temperatures for too long or without the proper protection, you can damage your skin or nerves. Watch for signs of skin damage due to cold. HOME CARE INSTRUCTIONS Follow  these tips to use ice and cold packs safely.  Place a dry or damp towel between the ice and skin. A damp towel will cool the skin more quickly, so you may need to shorten the time that the ice is used.  For a more rapid response, add gentle compression to the ice.  Ice for no more than 10 to 20 minutes at a time. The bonier the area you are icing, the less time it will take to get the benefits of ice.  Check your skin after 5 minutes to make sure there are no signs of a poor response to cold or skin damage.  Rest 20 minutes or more between uses.  Once your skin is numb, you can end your treatment. You can test numbness by very lightly touching your skin. The touch should be so light that you do not see the skin dimple from the pressure of your fingertip. When using ice, most people will feel these normal sensations in this order: cold, burning, aching, and numbness.  Do not use ice on someone who cannot communicate their responses to pain, such as small children or people with dementia. HOW TO MAKE AN ICE PACK Ice packs are the most common way to use ice therapy. Other methods include ice massage, ice baths, and cryosprays. Muscle creams that cause a cold, tingly feeling do not offer the same benefits that ice offers and should not be used as a substitute unless recommended by your caregiver. To make an ice pack, do one of the following:  Place crushed ice or a  bag of frozen vegetables in a sealable plastic bag. Squeeze out the excess air. Place this bag inside another plastic bag. Slide the bag into a pillowcase or place a damp towel between your skin and the bag.  Mix 3 parts water with 1 part rubbing alcohol. Freeze the mixture in a sealable plastic bag. When you remove the mixture from the freezer, it will be slushy. Squeeze out the excess air. Place this bag inside another plastic bag. Slide the bag into a pillowcase or place a damp towel between your skin and the bag. SEEK MEDICAL CARE  IF:  You develop white spots on your skin. This may give the skin a blotchy (mottled) appearance.  Your skin turns blue or pale.  Your skin becomes waxy or hard.  Your swelling gets worse. MAKE SURE YOU:   Understand these instructions.  Will watch your condition.  Will get help right away if you are not doing well or get worse. Document Released: 02/10/2011 Document Revised: 10/31/2013 Document Reviewed: 02/10/2011 [facility] Patient Information 2015 [place]. This information is not intended to replace advice given to you by your health care provider. Make sure you discuss any questions you have with your health care provider.  EXTENSION: Standing - Resistance Band: Stable (Active)   Stand, right arm at side. Against yellow resistance band, draw arm backward, as far as possible, keeping elbow straight. Complete __3_ sets of _10__ repetitions. Perform _1-2__ sessions per day.     Copyright  VHI. All rights reserved.  Resistive Band Rowing   With resistive band anchored in door, grasp both ends. Keeping elbows bent, pull back, squeezing shoulder blades together. Hold _3___ seconds. Repeat _10 x 3___ times. Do _1-2___ sessions per day.  http://gt2.exer.us/97   Copyright  VHI. All rights reserved.  Posture Tips DO: - stand tall and erect - keep chin tucked in - keep head and shoulders in alignment - check posture regularly in mirror or large window - pull head back against headrest in car seat;  Change your position often.  Sit with lumbar support. DON'T: - slouch or slump while watching TV or reading - sit, stand or lie in one position  for too long;  Sitting is especially hard on the spine so if you sit at a desk/use the computer, then stand up often!   Copyright  VHI. All rights reserved.  Posture - Standing   Good posture is important. Avoid slouching and forward head thrust. Maintain curve in low back and align ears over shoul- ders, hips over ankles.  Pull  your belly button in toward your back bone. Stand with even weight in toes and heels.  Ribs lifted up to sky with chin down.  Begin your exercise in this position [place]!!!   Copyright  VHI. All rights reserved.  Posture - Sitting   Sit upright, head facing forward. Try using a roll to support lower back. Keep shoulders relaxed, and avoid rounded back. Keep hips level with knees. Avoid crossing legs for long periods. Sit on sit bones not tail bone.  Do not sit on bed to sew!!   Copyright  VHI. All rights reserved.   Garen LahLawrie Beardsley, PT 05/31/2015 8:45 AM Phone: [phone] Fax: [phone]

## 2015-05-31 NOTE — Therapy (Signed)
[facility] [address] [place], [place], [address] Phone: [phone]   Fax:  [phone]  Physical Therapy Evaluation  Patient Details  Name: Jill [name] MRN: [id number] Date of Birth: Jan 08, 1946 Referring Provider: Lynnea Ferrier MD  Encounter Date: 05/31/2015      PT End of Session - 05/31/15 0814    Visit Number 1   Number of Visits 16   Date for PT Re-Evaluation 07/26/15   Authorization Type [employer]   PT Start Time 0800   PT Stop Time 0845   PT Time Calculation (min) 45 min   Activity Tolerance Patient tolerated treatment well   Behavior During Therapy [facility] for tasks assessed/performed      Past Medical History  Diagnosis Date  . Hypertension   . Thyroid disease     hypothyroid    Past Surgical History  Procedure Laterality Date  . Abdominal hysterectomy    . Shoulder surgery Bilateral   . Knee surgery Left     11 surgery   . Cataract extraction      There were no vitals filed for this visit.  Visit Diagnosis:  Decreased ROM of right shoulder  Shoulder pain, right  Abnormal posture  Activity intolerance      Subjective Assessment - 05/31/15 0804    Subjective I have had right shoulder pain for over a year   Pertinent History no injuries but I had bone spurs removed in 1992, Allergic to cortisone - skin hives,   Left TKA x 3   Limitations Lifting   How long can you sit comfortably? unlimited   How long can you stand comfortably? unlimited   How long can you walk comfortably? unlimited   Diagnostic tests x ray   Patient Stated Goals move my arm without pain and be able to sleep without waking due pain   Currently in Pain? Yes   Pain Score 5    Pain Location Shoulder   Pain Orientation Right   Pain Descriptors / Indicators Sharp   Pain Onset More than a month ago   Pain Frequency Intermittent   Aggravating Factors  lifting arm, sleeping on Right side,  Pt sewing / embroidering    Pain Relieving Factors position            [facility] PT Assessment - 05/31/15 0806    Assessment   Medical Diagnosis Right shoulder pain   Referring Provider Lynnea Ferrier MD   Onset Date/Surgical Date 05/30/14   Hand Dominance Right   Prior Therapy none   Precautions   Precautions None   Precaution Comments ALLERGIC to CORTISONE   Restrictions   Weight Bearing Restrictions No   Balance Screen   Has the patient fallen in the past 6 months Yes   How many times? 3  Left knee gives out and limited to 70degrees.   Has the patient had a decrease in activity level because of a fear of falling?  No   Is the patient reluctant to leave their home because of a fear of falling?  No   Home Environment   Living Environment Private residence   Type of Home House   Home Access Stairs to enter   Entrance Stairs-Number of Steps 6   Entrance Stairs-Rails Right   Home Layout Multi-level   Prior Function   Level of Independence Independent   Cognition   Overall Cognitive Status Within Functional Limits for tasks assessed   Observation/Other Assessments   Focus  on Therapeutic Outcomes (FOTO)  Intake 48% limitation 52% predicted 38%   Posture/Postural Control   Posture/Postural Control Postural limitations   Postural Limitations Rounded Shoulders;Forward head;Anterior pelvic tilt   AROM   Right Shoulder Flexion 115 Degrees  pain   Right Shoulder ABduction 110 Degrees   Right Shoulder Internal Rotation 16 Degrees  marked pain   Right Shoulder External Rotation 42 Degrees  painful   Right Shoulder Horizontal  ADduction 95 Degrees   Left Shoulder Flexion 162 Degrees   Left Shoulder ABduction 157 Degrees   Left Shoulder Internal Rotation 58 Degrees   Left Shoulder External Rotation 90 Degrees   Left Shoulder Horizontal ADduction 130 Degrees   Strength   Overall Strength Comments limited on Right due to pain with flex and abd   Right Shoulder Flexion --  unable to tolerate resistance    Right Shoulder ABduction --  unable to tolerate resistance   Left Shoulder Flexion 4/5   Left Shoulder ABduction 4/5   Palpation   Palpation comment  tenderness over anterior shoulder at RTC insertion. anteriro lateral inferior to acromion. Pt with tightend posterior capsule                   [facility] Adult PT Treatment/Exercise - 05/31/15 0806    Shoulder Exercises: Standing   Extension Both;Strengthening;10 reps  x2 VC/TC   Theraband Level (Shoulder Extension) Level 2 (Red)   Row Both;Strengthening;10 reps  x2 VC TC                PT Education - 05/31/15 1341    Education provided Yes   Education Details POC, initial scapular exericses HEP and posture/sitting and standing.cyro therapy   Person(s) Educated Patient   Methods Explanation;Demonstration;Tactile cues;Verbal cues;Handout   Comprehension Returned demonstration;Verbalized understanding          PT Short Term Goals - 05/31/15 1345    PT SHORT TERM GOAL #1   Title "Independent with initial HEP   Time 4   Period Weeks   Status New   PT SHORT TERM GOAL #2   Title "Report pain decrease from   5 /10 to    3/10. at rest and to sleep at night   Time 4   Period Weeks   Status New   PT SHORT TERM GOAL #3   Title "Demonstrate and verbalize understanding of condition management including RICE, positioning, HEP, use of sling   Time 4   Period Weeks   Status New           PT Long Term Goals - 05/31/15 1347    PT LONG TERM GOAL #1   Title "Pt will be independent with advanced HEP.    Time 8   Period Weeks   Status New   PT LONG TERM GOAL #2   Title "Pain will decrease to 1/10 with all functional activities   Time 8   Period Weeks   Status New   PT LONG TERM GOAL #3   Title "FOTO will improve from   52 % limitation  to 38% limitation     indicating improved functional mobility  .    Time 8   Period Weeks   Status New   PT LONG TERM GOAL #4   Title Pt will be able to sleep on right  shoulder for sleep without shoulder pain awakening at night   Time 8   Period Weeks   Status New   PT LONG TERM  GOAL #5   Title "R shoulder FIR and FER [facility] to return to pain-free ADLs such as dressing and grooming   Time 8   Period Weeks   Status New               Plan - 05/31/15 1310    Clinical Impression Statement Pt presents with signs and symptoms compatible with impingement with rotator cuff tendinitis. Pt presents with impairments including pain, limited ROM, weakness, and as a result, pt is limited with ADLs and functional activities. Pt would benefit from skilled outpatient PT services for 2 times a week for 8 weeks to progress toward pain-free PLOF.    Pt will benefit from skilled therapeutic intervention in order to improve on the following deficits Pain;Impaired UE functional use;Decreased strength;Decreased range of motion;Postural dysfunction;Improper body mechanics   Rehab Potential Good   PT Frequency 2x / week   PT Duration 8 weeks   PT Treatment/Interventions ADLs/Self Care Home Management;Cryotherapy;Electrical Stimulation;Iontophoresis 4mg /ml Dexamethasone;Moist Heat;Therapeutic exercise;Functional mobility training;Ultrasound;Neuromuscular re-education;Patient/family education;Manual techniques;Taping;Dry needling;Passive range of motion   PT Next Visit Plan Modalities, HEP for scapular strength.  IR mobilization.   PT Home Exercise Plan Pt given rows and bil shoulder extension   Consulted and Agree with Plan of Care Patient          G-Codes - 05/31/15 0814    Functional Assessment Tool Used FOTO ( EVAL 52% limitation)   Functional Limitation Carrying, moving and handling objects   Carrying, Moving and Handling Objects Current Status ([id number]) At least 40 percent but less than 60 percent impaired, limited or restricted  52 %   Carrying, Moving and Handling Objects Goal Status (U0454(G8985) At least 20 percent but less than 40 percent impaired,  limited or restricted  38%       Problem List Patient Active Problem List   Diagnosis Date Noted  . Acute bronchitis 12/29/2012  . HYPOTHYROIDISM 09/10/2009  . HYPERTENSION 09/10/2009  . ALLERGIC RHINITIS 09/10/2009   Garen LahLawrie Beardsley, PT 05/31/2015 1:54 PM Phone: [phone] Fax: [phone]  By signing I understand that I am ordering/authorizing the use of Iontophoresis using 4 mg/mL of dexamethasone as a component of this plan of care.  [facility] [address] [place], [place], [address] Phone: [phone]   Fax:  [phone]  Name: Jill [name] MRN: [id number] Date of Birth: 11-12-1945

## 2015-06-04 ENCOUNTER — Ambulatory Visit: Payer: Medicare HMO | Admitting: Physical Therapy

## 2015-06-04 DIAGNOSIS — R6889 Other general symptoms and signs: Secondary | ICD-10-CM | POA: Diagnosis not present

## 2015-06-04 DIAGNOSIS — R293 Abnormal posture: Secondary | ICD-10-CM | POA: Diagnosis not present

## 2015-06-04 DIAGNOSIS — M25511 Pain in right shoulder: Secondary | ICD-10-CM

## 2015-06-04 DIAGNOSIS — M25611 Stiffness of right shoulder, not elsewhere classified: Secondary | ICD-10-CM

## 2015-06-04 NOTE — Therapy (Signed)
[facility] [address] [place], [place], [address] Phone: [phone]   Fax:  [phone]  Physical Therapy Treatment  Patient Details  Name: Jill [name] MRN: [id number] Date of Birth: [birth year] Referring Provider: Lynnea Ferrier MD  Encounter Date: 06/04/2015      PT End of Session - 06/04/15 1318    Visit Number 2   Number of Visits 16   Date for PT Re-Evaluation 07/26/15   PT Start Time 0806   PT Stop Time 0850   PT Time Calculation (min) 44 min   Activity Tolerance Patient tolerated treatment well   Behavior During Therapy [facility] for tasks assessed/performed      Past Medical History  Diagnosis Date  . Hypertension   . Thyroid disease     hypothyroid    Past Surgical History  Procedure Laterality Date  . Abdominal hysterectomy    . Shoulder surgery Bilateral   . Knee surgery Left     11 surgery   . Cataract extraction      There were no vitals filed for this visit.  Visit Diagnosis:  Decreased ROM of right shoulder  Shoulder pain, right  Abnormal posture  Activity intolerance      Subjective Assessment - 06/04/15 0808    Subjective Pain severe when she reaches above her head or sleeping on it.   No pain at rest.     Does 2 sets of 10 but it hurts to do .   Currently in Pain? No/denies   Pain Score --   severe   Pain Location Shoulder   Pain Orientation Right   Pain Descriptors / Indicators Sharp;Shooting   Aggravating Factors  reaching overhead , sleeping on shoulder   Pain Relieving Factors change of position                         [facility] Adult PT Treatment/Exercise - 06/04/15 0810    Shoulder Exercises: Supine   Other Supine Exercises supine scapular stabilization 5 X each in series, smaller motions pain free.    Shoulder Exercises: Standing   Extension Limitations painful today, so decreased reps in clinic   Shoulder Exercises: Isometric Strengthening   Flexion  Limitations 5 x 5   Extension Limitations 5 x5   External Rotation Limitations 5x5   ABduction Limitations 5 X5   Manual Therapy   Manual therapy comments gentle stretching, grade 2 posterior capsule stretch, gentle                 PT Education - 06/04/15 1314    Education provided Yes   Education Details posture, isometric,   Person(s) Educated Patient   Methods Explanation;Demonstration;Tactile cues;Verbal cues;Handout   Comprehension Verbalized understanding;Returned demonstration          PT Short Term Goals - 06/04/15 1321    PT SHORT TERM GOAL #1   Title "Independent with initial HEP   Baseline cues needed   Time 4   Period Weeks   Status On-going   PT SHORT TERM GOAL #2   Title "Report pain decrease from   5 /10 to    3/10. at rest and to sleep at night   Time 4   Period Weeks   Status On-going   PT SHORT TERM GOAL #3   Title "Demonstrate and verbalize understanding of condition management including RICE, positioning, HEP, use of sling   Time 4   Period Weeks  Status On-going           PT Long Term Goals - 05/31/15 1347    PT LONG TERM GOAL #1   Title "Pt will be independent with advanced HEP.    Time 8   Period Weeks   Status New   PT LONG TERM GOAL #2   Title "Pain will decrease to 1/10 with all functional activities   Time 8   Period Weeks   Status New   PT LONG TERM GOAL #3   Title "FOTO will improve from   52 % limitation  to 38% limitation     indicating improved functional mobility  .    Time 8   Period Weeks   Status New   PT LONG TERM GOAL #4   Title Pt will be able to sleep on right shoulder for sleep without shoulder pain awakening at night   Time 8   Period Weeks   Status New   PT LONG TERM GOAL #5   Title "R shoulder FIR and FER [facility] to return to pain-free ADLs such as dressing and grooming   Time 8   Period Weeks   Status New               Plan - 06/04/15 1318    Clinical Impression Statement  patient was increasing her pain trying to do 30 reps of band exercises.  Doing isometrics because they were better tolerated.     PT Next Visit Plan review isometrics , IR Mobilization,  scapular strengthening.        Problem List Patient Active Problem List   Diagnosis Date Noted  . Acute bronchitis 12/29/2012  . HYPOTHYROIDISM 09/10/2009  . HYPERTENSION 09/10/2009  . ALLERGIC RHINITIS 09/10/2009    Janee Ureste 06/04/2015, 1:23 PM  [facility] [address] [place], [place], [address] Phone: [phone]   Fax:  [phone]  Name: Tonita PhoenixMargaret L Brier MRN: [id number] Date of Birth: 09/19/1945    Liz BeachKaren Linetta Regner, PTA 06/04/2015 1:23 PM Phone: [phone] Fax: [phone]

## 2015-06-04 NOTE — Patient Instructions (Signed)
Isometric shoulder from exercise drawer

## 2015-06-06 ENCOUNTER — Ambulatory Visit: Payer: Medicare HMO | Admitting: Physical Therapy

## 2015-06-06 DIAGNOSIS — R6889 Other general symptoms and signs: Secondary | ICD-10-CM

## 2015-06-06 DIAGNOSIS — R293 Abnormal posture: Secondary | ICD-10-CM | POA: Diagnosis not present

## 2015-06-06 DIAGNOSIS — M25611 Stiffness of right shoulder, not elsewhere classified: Secondary | ICD-10-CM

## 2015-06-06 DIAGNOSIS — M25511 Pain in right shoulder: Secondary | ICD-10-CM

## 2015-06-06 NOTE — Patient Instructions (Signed)
May press harder with isometrics as tolerated.

## 2015-06-06 NOTE — Therapy (Signed)
[facility] [address] [place], [place], [address] Phone: [phone]   Fax:  [phone]  Physical Therapy Treatment  Patient Details  Name: Jill [name] MRN: [id number] Date of Birth: [birth year] Referring Provider: Lynnea FerrierPickard, Warren MD  Encounter Date: 06/06/2015      PT End of Session - 06/06/15 0940    Visit Number 3   Number of Visits 16   Date for PT Re-Evaluation 07/26/15   PT Start Time 0847   PT Stop Time 0933   PT Time Calculation (min) 46 min   Activity Tolerance Patient tolerated treatment well   Behavior During Therapy [facility] for tasks assessed/performed      Past Medical History  Diagnosis Date  . Hypertension   . Thyroid disease     hypothyroid    Past Surgical History  Procedure Laterality Date  . Abdominal hysterectomy    . Shoulder surgery Bilateral   . Knee surgery Left     11 surgery   . Cataract extraction      There were no vitals filed for this visit.  Visit Diagnosis:  Decreased ROM of right shoulder  Shoulder pain, right  Abnormal posture  Activity intolerance      Subjective Assessment - 06/06/15 0847    Subjective More painful last night when shen she rolled over onto 4-5/10  Beytter with change of position.     Currently in Pain? Yes                         [facility] Adult PT Treatment/Exercise - 06/06/15 0854    Shoulder Exercises: Supine   Protraction Limitations 10 X aa, 10 X active  tricep2 10 X2 sets one set with 1 LB.   External Rotation 10 reps   Theraband Level (Shoulder External Rotation) --  white band   Other Supine Exercises head press 5 X 5 seconds.   Other Supine Exercises shoulder press 10 X 5 seconds   Shoulder Exercises: Standing   Other Standing Exercises UE ranger forward back, circles tactile both, step and reach, low height   Shoulder Exercises: Isometric Strengthening   Flexion Limitations 5X5   Extension Limitations 5X5   External Rotation Limitations 5X5   ABduction Limitations 5X5  able to press harder   Manual Therapy   Manual therapy comments gentle stretching, grade 2 posterior capsule stretch, gentle                   PT Short Term Goals - 06/04/15 1321    PT SHORT TERM GOAL #1   Title "Independent with initial HEP   Baseline cues needed   Time 4   Period Weeks   Status On-going   PT SHORT TERM GOAL #2   Title "Report pain decrease from   5 /10 to    3/10. at rest and to sleep at night   Time 4   Period Weeks   Status On-going   PT SHORT TERM GOAL #3   Title "Demonstrate and verbalize understanding of condition management including RICE, positioning, HEP, use of sling   Time 4   Period Weeks   Status On-going           PT Long Term Goals - 05/31/15 1347    PT LONG TERM GOAL #1   Title "Pt will be independent with advanced HEP.    Time 8   Period Weeks   Status New  PT LONG TERM GOAL #2   Title "Pain will decrease to 1/10 with all functional activities   Time 8   Period Weeks   Status New   PT LONG TERM GOAL #3   Title "FOTO will improve from   52 % limitation  to 38% limitation     indicating improved functional mobility  .    Time 8   Period Weeks   Status New   PT LONG TERM GOAL #4   Title Pt will be able to sleep on right shoulder for sleep without shoulder pain awakening at night   Time 8   Period Weeks   Status New   PT LONG TERM GOAL #5   Title "R shoulder FIR and FER will return to [facility] to return to pain-free ADLs such as dressing and grooming   Time 8   Period Weeks   Status New               Plan - 06/06/15 0941    Clinical Impression Statement Able to tolerate more pressure with isometrics.  Scapular mobility limited with reaching.  UE ranger helpful with this.  Pain may improve some due to no more sewing by hand.     PT Next Visit Plan  , IR Mobilization,  scapular strengthening.   PT Home Exercise Plan isometrics   Consulted and Agree  with Plan of Care Patient        Problem List Patient Active Problem List   Diagnosis Date Noted  . Acute bronchitis 12/29/2012  . HYPOTHYROIDISM 09/10/2009  . HYPERTENSION 09/10/2009  . ALLERGIC RHINITIS 09/10/2009    [facility] 06/06/2015, 9:46 AM  [facility] [address] [place], [place], [address] Phone: [phone]   Fax:  [phone]  Name: Jill [name] MRN: [id number] Date of Birth: Aug 24, 1945    Liz Beach, PTA 06/06/2015 9:46 AM Phone: [phone] Fax: [phone]

## 2015-06-12 ENCOUNTER — Ambulatory Visit: Payer: Medicare HMO | Admitting: Physical Therapy

## 2015-06-12 DIAGNOSIS — M25611 Stiffness of right shoulder, not elsewhere classified: Secondary | ICD-10-CM

## 2015-06-12 DIAGNOSIS — R6889 Other general symptoms and signs: Secondary | ICD-10-CM

## 2015-06-12 DIAGNOSIS — M25511 Pain in right shoulder: Secondary | ICD-10-CM

## 2015-06-12 DIAGNOSIS — R293 Abnormal posture: Secondary | ICD-10-CM | POA: Diagnosis not present

## 2015-06-12 NOTE — Patient Instructions (Signed)
IONTOPHORESIS PATIENT PRECAUTIONS & CONTRAINDICATIONS:  . Redness under one or both electrodes can occur.  This characterized by a uniform redness that usually disappears within 12 hours of treatment. . Small pinhead size blisters may result in response to the drug.  Contact your physician if the problem persists more than 24 hours. . On rare occasions, iontophoresis therapy can result in temporary skin reactions such as rash, inflammation, irritation or burns.  The skin reactions may be the result of individual sensitivity to the ionic solution used, the condition of the skin at the start of treatment, reaction to the materials in the electrodes, allergies or sensitivity to dexamethasone, or a poor connection between the patch and your skin.  Discontinue using iontophoresis if you have any of these reactions and report to your therapist. . Remove the Patch or electrodes if you have any undue sensation of pain or burning during the treatment and report discomfort to your therapist. . Tell your Therapist if you have had known adverse reactions to the application of electrical current. . If using the Patch, the LED light will turn off when treatment is complete and the patch can be removed.  Approximate treatment time is 1-3 hours.  Remove the patch when light goes off or after 6 hours. . The Patch can be worn during normal activity, however excessive motion where the electrodes have been placed can cause poor contact between the skin and the electrode or uneven electrical current resulting in greater risk of skin irritation. Marland Kitchen Keep out of the reach of children.   . DO NOT use if you have a cardiac pacemaker or any other electrically sensitive implanted device. . DO NOT use if you have a known sensitivity to dexamethasone. . DO NOT use during Magnetic Resonance Imaging (MRI). . DO NOT use over broken or compromised skin (e.g. sunburn, cuts, or acne) due to the increased risk of skin reaction. . DO  NOT SHAVE over the area to be treated:  To establish good contact between the Patch and the skin, excessive hair may be clipped. . DO NOT place the Patch or electrodes on or over your eyes, directly over your heart, or brain. . DO NOT reuse the Patch or electrodes as this may cause burns to occur.   Trigger Point Dry Needling  . What is Trigger Point Dry Needling (DN)? o DN is a physical therapy technique used to treat muscle pain and dysfunction. Specifically, DN helps deactivate muscle trigger points (muscle knots).  o A thin filiform needle is used to penetrate the skin and stimulate the underlying trigger point. The goal is for a local twitch response (LTR) to occur and for the trigger point to relax. No medication of any kind is injected during the procedure.   . What Does Trigger Point Dry Needling Feel Like?  o The procedure feels different for each individual patient. Some patients report that they do not actually feel the needle enter the skin and overall the process is not painful. Very mild bleeding may occur. However, many patients feel a deep cramping in the muscle in which the needle was inserted. This is the local twitch response.   Marland Kitchen How Will I feel after the treatment? o Soreness is normal, and the onset of soreness may not occur for a few hours. Typically this soreness does not last longer than two days.  o Bruising is uncommon, however; ice can be used to decrease any possible bruising.  o In rare cases feeling tired  or nauseous after the treatment is normal. In addition, your symptoms may get worse before they get better, this period will typically not last longer than 24 hours.   . What Can I do After My Treatment? o Increase your hydration by drinking more water for the next 24 hours. o You may place ice or heat on the areas treated that have become sore, however, do not use heat on inflamed or bruised areas. Heat often brings more relief post needling. o You can  continue your regular activities, but vigorous activity is not recommended initially after the treatment for 24 hours. o DN is best combined with other physical therapy such as strengthening, stretching, and other therapies.     {Pectora doorway stretch given in handout and shown in clinic  30 sec hold x 3  And sitting right shoulder   Jill [name], PT 06/12/2015 8:59 AM Phone: [phone] Fax: [phone]

## 2015-06-12 NOTE — Therapy (Signed)
[facility] [address] [place], [place], [address] Phone: [phone]   Fax:  [phone]  Physical Therapy Treatment  Patient Details  Name: Jill [name] MRN: [id number] Date of Birth: [birth year] Referring Provider: Lynnea Ferrier MD  Encounter Date: 06/12/2015      PT End of Session - 06/12/15 0836    Visit Number 4   Number of Visits 16   Date for PT Re-Evaluation 07/26/15   Authorization Type [employer]   PT Start Time [phone]   PT Stop Time 0920   PT Time Calculation (min) 44 min   Activity Tolerance Patient tolerated treatment well   Behavior During Therapy [facility] for tasks assessed/performed      Past Medical History  Diagnosis Date  . Hypertension   . Thyroid disease     hypothyroid    Past Surgical History  Procedure Laterality Date  . Abdominal hysterectomy    . Shoulder surgery Bilateral   . Knee surgery Left     11 surgery   . Cataract extraction      There were no vitals filed for this visit.  Visit Diagnosis:  Decreased ROM of right shoulder  Shoulder pain, right  Abnormal posture  Activity intolerance      Subjective Assessment - 06/12/15 0837    Subjective My pain is getting worse, Pt with pain at RTC on R with    Pertinent History no injuries but I had bone spurs removed in 1992, Allergic to cortisone - skin hives,   Left TKA x 3   How long can you sit comfortably? unlimited   How long can you stand comfortably? unlimited   How long can you walk comfortably? unlimited   Diagnostic tests x ray   Patient Stated Goals move my arm without pain and be able to sleep without waking due pain   Currently in Pain? Yes   Pain Score 7   only hurts with movement   Pain Location Shoulder   Pain Orientation Right   Pain Descriptors / Indicators Sharp   Pain Type Acute pain   Pain Onset More than a month ago   Pain Frequency Constant  with all movement   Aggravating Factors  reaching  overhead , sleeping on shoulder,  crossing Right shoulder over midline.  when crossing Right shoulder over midline                         [facility] Adult PT Treatment/Exercise - 06/12/15 0852    Shoulder Exercises: Seated   Other Seated Exercises sitting UE flex and IR stretch  with hand on table   Shoulder Exercises: Sidelying   Other Sidelying Exercises UE ranger in flex , abd   Shoulder Exercises: Standing   Other Standing Exercises UE ranger forward back, circles tactile both, step and reach, low height   Shoulder Exercises: Stretch   Corner Stretch 3 reps;30 seconds   Corner Stretch Limitations given handout with pidture for doorway and corner stretch.  performed shoulder stretch in cliniic   Iontophoresis   Type of Iontophoresis Dexamethasone   Location RTC of Right shoulder inferior to acromion on right    Dose 1cc in patch to be removed in 3-4 hours   Time 3-4 hours   Manual Therapy   Manual therapy comments gentle stretching, grade 2 posterior capsule stretch, gentle    Soft tissue mobilization Right upper trap and deltoid and RTC of Right shoulder  Trigger Point Dry Needling - 06/12/15 0934    Consent Given? Yes   Education Handout Provided Yes   Muscles Treated Upper Body Supraspinatus  deltoid  right              PT Education - 06/12/15 0936    Education provided Yes   Education Details trigger point and ionto information and precautioans. and aftercare  Pt instructed to remove iontophoresis patch if any sign of allergy/hives etc and given writtten handout   Person(s) Educated Patient   Methods Explanation;Demonstration;Verbal cues;Handout   Comprehension Verbalized understanding;Returned demonstration          PT Short Term Goals - 06/04/15 1321    PT SHORT TERM GOAL #1   Title "Independent with initial HEP   Baseline cues needed   Time 4   Period Weeks   Status On-going   PT SHORT TERM GOAL #2   Title "Report pain decrease  from   5 /10 to    3/10. at rest and to sleep at night   Time 4   Period Weeks   Status On-going   PT SHORT TERM GOAL #3   Title "Demonstrate and verbalize understanding of condition management including RICE, positioning, HEP, use of sling   Time 4   Period Weeks   Status On-going           PT Long Term Goals - 05/31/15 1347    PT LONG TERM GOAL #1   Title "Pt will be independent with advanced HEP.    Time 8   Period Weeks   Status New   PT LONG TERM GOAL #2   Title "Pain will decrease to 1/10 with all functional activities   Time 8   Period Weeks   Status New   PT LONG TERM GOAL #3   Title "FOTO will improve from   52 % limitation  to 38% limitation     indicating improved functional mobility  .    Time 8   Period Weeks   Status New   PT LONG TERM GOAL #4   Title Pt will be able to sleep on right shoulder for sleep without shoulder pain awakening at night   Time 8   Period Weeks   Status New   PT LONG TERM GOAL #5   Title "R shoulder FIR and FER [facility] to return to pain-free ADLs such as dressing and grooming   Time 8   Period Weeks   Status New               Plan - 06/12/15 1017    Clinical Impression Statement Pt enters clinic with 8/10 pain with movement.  Not tolerating exericse at 80 to 90 degree flexion.  Pt given trigger point dry needling to deltoid and upper trap and iontophoresis for control of pain.  Pt given pectoral stretch for corner and doorway.  [place] and did not want to do any exercises that would increase pain.  Pt told that exerciis and strength of weakned muscles of scapular stabilizres would help with pain .   PT Next Visit Plan  , IR Mobilization,  scapular strengthening.supine scapular stretngth. Assess dry needlling and iontonphoresis    PT Home Exercise Plan pectoral stretch        Problem List Patient Active Problem List   Diagnosis Date Noted  . Acute bronchitis 12/29/2012  .  HYPOTHYROIDISM 09/10/2009  . HYPERTENSION 09/10/2009  .  ALLERGIC RHINITIS 09/10/2009   Garen Lah, PT 06/12/2015 1:09 PM Phone: [phone] Fax: [phone]  [facility] [address] [place], [place], [address] Phone: [phone]   Fax:  [phone]  Name: BRANDOLYN SHORTRIDGE MRN: [id number] Date of Birth: 08-Nov-1945

## 2015-06-15 ENCOUNTER — Encounter: Payer: Medicare HMO | Admitting: Physical Therapy

## 2015-06-19 ENCOUNTER — Ambulatory Visit: Payer: Medicare HMO | Admitting: Physical Therapy

## 2015-06-19 DIAGNOSIS — R293 Abnormal posture: Secondary | ICD-10-CM | POA: Diagnosis not present

## 2015-06-19 DIAGNOSIS — M25511 Pain in right shoulder: Secondary | ICD-10-CM | POA: Diagnosis not present

## 2015-06-19 DIAGNOSIS — R6889 Other general symptoms and signs: Secondary | ICD-10-CM

## 2015-06-19 DIAGNOSIS — M25611 Stiffness of right shoulder, not elsewhere classified: Secondary | ICD-10-CM

## 2015-06-19 NOTE — Patient Instructions (Signed)
[name],  Exercises should be in PAINFREE RANGE  Strengthening: Isometric Flexion  Using wall for resistance, press right fist into ball using light pressure. Hold _5___ seconds. Repeat _10___ times per set. Do __1__ sets per session. Do 1-2___ sessions per day.  SHOULDER: Abduction (Isometric)  Use wall as resistance. Press arm against pillow. Keep elbow straight. Hold __5_ seconds. _10__ reps per set, _1-2__ sets per day, _7__ days per week  Extension (Isometric)  Place left bent elbow and back of arm against wall. Press elbow against wall. Hold _5___ seconds. Repeat _10___ times. Do _1-2___ sessions per day.  Internal Rotation (Isometric)  Place palm of right fist against door frame, with elbow bent. Press fist against door frame. Hold __5__ seconds. Repeat _10___ times. Do _1-2___ sessions per day.  External Rotation (Isometric)  Place back of left fist against door frame, with elbow bent. Press fist against door frame. Hold 5____ seconds. Repeat __10__ times. Do __1-2__ sessions per day.  Copyright  VHI. All rights reserved.   Jill LahLawrie [name], PT 06/19/2015 9:58 AM Phone: [phone] Fax: [phone]

## 2015-06-19 NOTE — Therapy (Addendum)
[facility] Outpatient Rehabilitation [facility] [address], [place], [address] Phone: [phone]   Fax:  [phone]  Physical Therapy Treatment/Discharge Note  Patient Details  Name: Jill [name] MRN: [id number] Date of Birth: [birth year] Referring Provider: Lynnea Ferrier MD  Encounter Date: 06/19/2015      PT End of Session - 06/19/15 1015    Visit Number 5   Number of Visits 16   Date for PT Re-Evaluation 07/26/15   Authorization Type [employer]   PT Start Time [phone]   PT Stop Time 1014   PT Time Calculation (min) 43 min   Activity Tolerance Patient limited by pain   Behavior During Therapy [facility] for tasks assessed/performed      Past Medical History  Diagnosis Date  . Hypertension   . Thyroid disease     hypothyroid    Past Surgical History  Procedure Laterality Date  . Abdominal hysterectomy    . Shoulder surgery Bilateral   . Knee surgery Left     11 surgery   . Cataract extraction      There were no vitals filed for this visit.  Visit Diagnosis:  Decreased ROM of right shoulder  Shoulder pain, right  Abnormal posture  Activity intolerance      Subjective Assessment - 06/19/15 0936    Subjective Wednesday night i rolled over on my Right shoulder and heard a loud pop that woke me up and my husband.  It hurt and I cried. 8/10.  today my shoulder is not hurting so bad but I havent really moved or done any exericise.  Even when I adduct my arm , it hurts   Pertinent History no injuries but I had bone spurs removed in 1992, Allergic to cortisone - skin hives,   Left TKA x 3   Patient Stated Goals move my arm without pain and be able to sleep without waking due pain   Currently in Pain? Yes   Pain Score 5   Was an 8/10 on WEdnesday night   Pain Location Shoulder   Pain Orientation Right   Pain Descriptors / Indicators Sharp   Pain Type Acute pain   Pain Onset More than a month ago   Pain Frequency Intermittent             [facility] PT Assessment - 06/19/15 0939    AROM   Right Shoulder Flexion 105 Degrees   Right Shoulder ABduction 80 Degrees   Palpation   Palpation comment  tenderness over anterior shoulder at RTC insertion. anteriro lateral inferior to acromion. Pt with tightend posterior capsule. after "pop" pt now hurting more over bicipital groove                     [facility] Adult PT Treatment/Exercise - 06/19/15 0938    Shoulder Exercises: Supine   Other Supine Exercises supine PROM of shoullder to pain (approximately 100 degrees flex and abd with assistance of scapular movement.     Shoulder Exercises: Sidelying   Other Sidelying Exercises UE ranger in flex , abd, and PT assist x 10 each   Shoulder Exercises: Standing   Other Standing Exercises shoulder isometrics in all ranges with elbow at 90 for IR/ER with door frame and with minimal flex/ abd 10 x each in pain free range   Other Standing Exercises palms together with wall slide and deltoid engagement  x10   Manual Therapy   Manual therapy comments gentle stretching, grade 2  posterior capsule stretch, gentle    Soft tissue mobilization Right upper trap and deltoid and RTC of Right shoulder as well over biceps tendon   Passive ROM Right shoulder in all planes.  Pt limited by pain in right shoulder to about 100 PROM flex and abd                PT Education - 06/19/15 1002    Education provided Yes   Education Details  Shoulder isometrics   Person(s) Educated Patient   Methods Explanation;Demonstration;Tactile cues;Handout;Verbal cues   Comprehension Verbalized understanding;Returned demonstration          PT Short Term Goals - 06/04/15 1321    PT SHORT TERM GOAL #1   Title "Independent with initial HEP   Baseline cues needed   Time 4   Period Weeks   Status On-going   PT SHORT TERM GOAL #2   Title "Report pain decrease from   5 /10 to    3/10. at rest and to sleep at night   Time 4   Period Weeks    Status On-going   PT SHORT TERM GOAL #3   Title "Demonstrate and verbalize understanding of condition management including RICE, positioning, HEP, use of sling   Time 4   Period Weeks   Status On-going           PT Long Term Goals - 05/31/15 1347    PT LONG TERM GOAL #1   Title "Pt will be independent with advanced HEP.    Time 8   Period Weeks   Status New   PT LONG TERM GOAL #2   Title "Pain will decrease to 1/10 with all functional activities   Time 8   Period Weeks   Status New   PT LONG TERM GOAL #3   Title "FOTO will improve from   52 % limitation  to 38% limitation     indicating improved functional mobility  .    Time 8   Period Weeks   Status New   PT LONG TERM GOAL #4   Title Pt will be able to sleep on right shoulder for sleep without shoulder pain awakening at night   Time 8   Period Weeks   Status New   PT LONG TERM GOAL #5   Title "R shoulder FIR and FER will return to [facility] to return to pain-free ADLs such as dressing and grooming   Time 8   Period Weeks   Status New               Plan - 06/19/15 1011    Clinical Impression Statement Pt had a loud pop in Right shoulder at night in bed on WEdnesday , Dec 14th.  Pt reports hearing audible pop that awoke husband and her and she cried due to the pain.  Pt is seeing Dr. Buelah Manis on Dec 21st and will need a new order to continue PT after evaluated by MD.  Pt seen today for  PROM and isometrics.  Pt with good strength and no pain with resistance but pain on PROM of horizontal add, flex and abd to 100 degrees.     Pt will benefit from skilled therapeutic intervention in order to improve on the following deficits Pain;Impaired UE functional use;Decreased strength;Decreased range of motion;Postural dysfunction;Improper body mechanics   Rehab Potential Good   PT Frequency 2x / week   PT Duration 8 weeks   PT Treatment/Interventions ADLs/Self Care Home Management;Cryotherapy;Dealer  Stimulation;Iontophoresis  '4mg'$ /ml Dexamethasone;Moist Heat;Therapeutic exercise;Functional mobility training;Ultrasound;Neuromuscular re-education;Patient/family education;Manual techniques;Taping;Dry needling;Passive range of motion   PT Next Visit Plan Check with MD response. Pt told to bring MD order to continue PT after her incident.     PT Home Exercise Plan shoulder isometrics   Consulted and Agree with Plan of Care Patient        Problem List Patient Active Problem List   Diagnosis Date Noted  . Acute bronchitis 12/29/2012  . HYPOTHYROIDISM 09/10/2009  . HYPERTENSION 09/10/2009  . ALLERGIC RHINITIS 09/10/2009    Late Entry G-code:  06-19-15   PT Clinical judgement Current:  Goal: CJ DC:CK   Voncille Lo, PT 06/19/2015 6:04 PM Phone: [phone] Fax: [facility] [place], [place], [address] Phone: [phone]   Fax:  [phone]  Name: Jill [name] MRN: [id number] Date of Birth: Jul 09, 1945   PHYSICAL THERAPY DISCHARGE SUMMARY  Visits from Start of Care: 5  Current functional level related to goals / functional outcomes: As above   Remaining deficits: 5/10 pain  Was 8/10 pain on Wednesday night as described above and pt referred back to MD  Education / Equipment: Initial HEP and posture training Plan:                                                    Patient goals were not met. Patient is being discharged due to the physician's request.  ?????        Pt is going to vacation in [place] for 3 months and will see orthopedic MD when she returns.  Voncille Lo, PT 06/28/2015 8:48 AM Phone: [phone] Fax: [phone]

## 2015-06-20 ENCOUNTER — Encounter: Payer: Self-pay | Admitting: Family Medicine

## 2015-06-20 ENCOUNTER — Ambulatory Visit (INDEPENDENT_AMBULATORY_CARE_PROVIDER_SITE_OTHER): Payer: Medicare HMO | Admitting: Family Medicine

## 2015-06-20 VITALS — BP 140/80 | HR 82 | Temp 98.5°F | Resp 16 | Ht 64.0 in | Wt 203.0 lb

## 2015-06-20 DIAGNOSIS — L309 Dermatitis, unspecified: Secondary | ICD-10-CM | POA: Diagnosis not present

## 2015-06-20 DIAGNOSIS — M19011 Primary osteoarthritis, right shoulder: Secondary | ICD-10-CM | POA: Diagnosis not present

## 2015-06-20 DIAGNOSIS — Z23 Encounter for immunization: Secondary | ICD-10-CM | POA: Diagnosis not present

## 2015-06-20 DIAGNOSIS — M19019 Primary osteoarthritis, unspecified shoulder: Secondary | ICD-10-CM | POA: Insufficient documentation

## 2015-06-20 MED ORDER — MIRTAZAPINE 15 MG PO TABS: 15.0000 mg | ORAL_TABLET | Freq: Every day | ORAL | Status: DC

## 2015-06-20 MED ORDER — CLOBETASOL PROPIONATE 0.05 % EX CREA: 1.0000 "application " | TOPICAL_CREAM | Freq: Two times a day (BID) | CUTANEOUS | Status: DC

## 2015-06-20 NOTE — Assessment & Plan Note (Signed)
D/C physical therapy  Continue topical Voltaren Pt wants to hold off on ortho until she returns from her winter vacation in March

## 2015-06-20 NOTE — Patient Instructions (Addendum)
Call for referral to ortho Take Remeron at bedtime Use topical cream F/U as needed

## 2015-06-20 NOTE — Progress Notes (Signed)
[name], female   DOB: Sep 10, 1945, 69 y.o.   MRN: [id number]   Subjective:    [name], female    DOB: Sep 10, 1945, 69 y.o.   MRN: [id number]  Patient presents for Shoulder Pain and Skin Irritation  year with rash to her right lower leg. She's had this a few years ago. She states that she saw multiple dermatologists and pulled out a bottle of Remeron and states that she would think she was also on gabapentin and something else and it cleared right up. She states that she has not noted actual diagnosis but there was no fungus or infection in it. His been present for the past 2 months but the itching is becoming more intense. She is tried Aveeno as well as vitamin E and cortisone 10 on it with minimal improvement.  She also has history of chronic right shoulder pain. She tried physical therapy as recommended by her PCP but this caused more pain. The prednisone by mouth also did not help. I reviewed the notes the next recommendation was to send her orthopedist however she is going on a 3 month trip for her when her vacation which she does every year to [place] and therefore she wants to hold off until she returns. Declines any pain medication.    Review Of Systems:  GEN- denies fatigue, fever, weight loss,weakness, recent illness HEENT- denies eye drainage, change in vision, nasal discharge, CVS- denies chest pain, palpitations RESP- denies SOB, cough, wheeze ABD- denies N/V, change in stools, abd pain GU- denies dysuria, hematuria, dribbling, incontinence MSK-+ joint pain, muscle aches, injury Neuro- denies headache, dizziness, syncope, seizure activity       Objective:    BP 140/80 mmHg  Pulse 82  Temp(Src) 98.5 F (36.9 C) (Oral)  Resp 16  Ht 5\' 4"  (1.626 m)  Wt 203 lb (92.08 kg)  BMI 34.83 kg/m2 GEN- NAD, alert and oriented x3 Ext- pedal edema,  Skin- Right shin 5x3 areas of raised erythema with small blistering at edges, NT,  +blanching, no pustules, + excoriations       Assessment & Plan:      Problem List Items Addressed This Visit    Osteoarthritis, shoulder    D/C physical therapy  Continue topical Voltaren Pt wants to hold off on ortho until she returns from her winter vacation in March       Other Visit Diagnoses    Dermatitis    -  Primary    Appears to be more statis dermatitis, severe pruritis, topical Clobetasol, Benadryl, refilled Remeron though not sure the connection with the rash     Need for prophylactic vaccination and inoculation against influenza        Relevant Orders    Flu Vaccine QUAD 36+ mos PF IM (Fluarix & Fluzone Quad PF) (Completed)       Note: This dictation was prepared with Dragon dictation along with smaller phrase technology. Any transcriptional errors that result from this process are unintentional.

## 2015-06-21 ENCOUNTER — Ambulatory Visit: Payer: Medicare HMO

## 2015-06-21 ENCOUNTER — Encounter: Payer: Medicare HMO | Admitting: Physical Therapy

## 2015-06-26 ENCOUNTER — Encounter: Payer: Medicare HMO | Admitting: Physical Therapy

## 2015-06-28 ENCOUNTER — Encounter: Payer: Medicare HMO | Admitting: Physical Therapy

## 2015-06-29 ENCOUNTER — Other Ambulatory Visit: Payer: Self-pay | Admitting: Family Medicine

## 2015-06-29 MED ORDER — HYDROXYZINE HCL 25 MG PO TABS: 25.0000 mg | ORAL_TABLET | Freq: Three times a day (TID) | ORAL | Status: DC | PRN

## 2015-09-10 ENCOUNTER — Other Ambulatory Visit: Payer: Self-pay | Admitting: Family Medicine

## 2015-09-10 ENCOUNTER — Encounter: Payer: Self-pay | Admitting: Family Medicine

## 2015-09-10 ENCOUNTER — Ambulatory Visit (INDEPENDENT_AMBULATORY_CARE_PROVIDER_SITE_OTHER): Payer: Medicare HMO | Admitting: Family Medicine

## 2015-09-10 VITALS — BP 140/80 | HR 74 | Temp 97.9°F | Resp 18 | Ht 63.5 in | Wt 200.0 lb

## 2015-09-10 DIAGNOSIS — L309 Dermatitis, unspecified: Secondary | ICD-10-CM

## 2015-09-10 DIAGNOSIS — E038 Other specified hypothyroidism: Secondary | ICD-10-CM | POA: Diagnosis not present

## 2015-09-10 DIAGNOSIS — I10 Essential (primary) hypertension: Secondary | ICD-10-CM | POA: Diagnosis not present

## 2015-09-10 DIAGNOSIS — Z1231 Encounter for screening mammogram for malignant neoplasm of breast: Secondary | ICD-10-CM | POA: Diagnosis not present

## 2015-09-10 DIAGNOSIS — Z1239 Encounter for other screening for malignant neoplasm of breast: Secondary | ICD-10-CM

## 2015-09-10 LAB — COMPLETE METABOLIC PANEL WITH GFR
ALT: 12 U/L (ref 6–29)
AST: 18 U/L (ref 10–35)
Albumin: 3.9 g/dL (ref 3.6–5.1)
Alkaline Phosphatase: 51 U/L (ref 33–130)
BILIRUBIN TOTAL: 0.5 mg/dL (ref 0.2–1.2)
BUN: 16 mg/dL (ref 7–25)
CHLORIDE: 105 mmol/L (ref 98–110)
CO2: 27 mmol/L (ref 20–31)
CREATININE: 1.01 mg/dL — AB (ref 0.50–0.99)
Calcium: 9 mg/dL (ref 8.6–10.4)
GFR, Est African American: 66 mL/min (ref >=60)
GFR, Est Non African American: 57 mL/min — ABNORMAL LOW (ref >=60)
Glucose, Bld: 89 mg/dL (ref 70–99)
Potassium: 4.4 mmol/L (ref 3.5–5.3)
Sodium: 140 mmol/L (ref 135–146)
Total Protein: 6.6 g/dL (ref 6.1–8.1)

## 2015-09-10 LAB — CBC WITH DIFFERENTIAL/PLATELET
Basophils Absolute: 0.1 10*3/uL (ref 0.0–0.1)
Basophils Relative: 1 % (ref 0–1)
EOS PCT: 4 % (ref 0–5)
Eosinophils Absolute: 0.3 10*3/uL (ref 0.0–0.7)
HCT: 43.2 % (ref 36.0–46.0)
HEMOGLOBIN: 14.2 g/dL (ref 12.0–15.0)
LYMPHS PCT: 48 % — AB (ref 12–46)
Lymphs Abs: 3.4 10*3/uL (ref 0.7–4.0)
MCH: 30.7 pg (ref 26.0–34.0)
MCHC: 32.9 g/dL (ref 30.0–36.0)
MCV: 93.5 fL (ref 78.0–100.0)
MPV: 9.9 fL (ref 8.6–12.4)
Monocytes Absolute: 0.6 10*3/uL (ref 0.1–1.0)
Monocytes Relative: 8 % (ref 3–12)
NEUTROS PCT: 39 % — AB (ref 43–77)
Neutro Abs: 2.7 10*3/uL (ref 1.7–7.7)
Platelets: 250 10*3/uL (ref 150–400)
RBC: 4.62 MIL/uL (ref 3.87–5.11)
RDW: 12.9 % (ref 11.5–15.5)
WBC: 7 10*3/uL (ref 4.0–10.5)

## 2015-09-10 LAB — LIPID PANEL
CHOLESTEROL: 168 mg/dL (ref 125–200)
HDL: 56 mg/dL (ref >=46)
LDL CALC: 97 mg/dL (ref <130)
Total CHOL/HDL Ratio: 3 Ratio (ref <=5.0)
Triglycerides: 77 mg/dL (ref <150)
VLDL: 15 mg/dL (ref <30)

## 2015-09-10 LAB — TSH: TSH: 3.7 mIU/L

## 2015-09-10 MED ORDER — HYDROCHLOROTHIAZIDE 25 MG PO TABS
25.0000 mg | ORAL_TABLET | Freq: Every day | ORAL | Status: DC
Start: 2015-09-10 — End: 2016-09-17

## 2015-09-10 NOTE — Progress Notes (Signed)
Subjective:    [name], female    DOB: Apr 09, 1946, 70 y.o.   MRN: [id number]  HPI  I saw the patient last year for a rash on her right shin. 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 I gave her a topical steroid cream but the rash did not improve. In retrospect, the rash is unilateral. However her swelling is symmetric and bilateral. The rash is slightly violaceous in color. There are small flattop polygonal papules. 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 It is very itchy. She is taking an angiotensin receptor blocker. She is overdue for fasting lab work. She is also due to recheck her TSH.  Past Medical History  Diagnosis Date  . Hypertension   . Thyroid disease     hypothyroid   Past Surgical History  Procedure Laterality Date  . Abdominal hysterectomy    . Shoulder surgery Bilateral   . Knee surgery Left     11 surgery   . Cataract extraction     Current Outpatient Prescriptions on File Prior to Visit  Medication Sig Dispense Refill  . antiseptic oral rinse (BIOTENE) LIQD 15 mLs by Mouth Rinse route as needed.    Marland Kitchen aspirin 81 MG tablet Take 81 mg by mouth daily.    . clobetasol cream (TEMOVATE) 0.05 % Apply 1 application topically 2 (two) times daily. 30 g 1  . diclofenac sodium (VOLTAREN) 1 % GEL Apply 2 g topically 4 (four) times daily. 100 g 0  . docusate sodium (COLACE) 100 MG capsule Take 100 mg by mouth 2 (two) times daily.    . fexofenadine (ALLEGRA) 180 MG tablet Take 1 tablet (180 mg total) by mouth daily. 90 tablet 4  . hydrOXYzine (ATARAX/VISTARIL) 25 MG tablet Take 1 tablet (25 mg total) by mouth 3 (three) times daily as needed. 30 tablet 0  . levothyroxine (SYNTHROID, LEVOTHROID) 100 MCG tablet Take 1 tablet (100 mcg total) by mouth daily. (Patient taking differently: Take 100 mcg by mouth daily. Alternates QOD) 90 tablet 3  . levothyroxine (SYNTHROID, LEVOTHROID) 112 MCG tablet Take 1 tablet (112 mcg total) by mouth daily before breakfast. (Patient  taking differently: Take 112 mcg by mouth daily before breakfast. Alternates QOD) 90 tablet 3  . losartan (COZAAR) 100 MG tablet Take one tablet by mouth one time daily 90 tablet 3  . mirtazapine (REMERON) 15 MG tablet Take 1 tablet (15 mg total) by mouth at bedtime. 30 tablet 2  . Multiple Vitamins-Minerals (AIRBORNE PO) Take 2 tablets by mouth daily.    . naproxen (NAPROSYN) 250 MG tablet Take 1 tablet (250 mg total) by mouth 2 (two) times daily as needed for mild pain, moderate pain or headache (TAKE WITH MEALS.). 20 tablet 0   No current facility-administered medications on file prior to visit.   Allergies  Allergen Reactions  . Penicillins   . Tylenol [Acetaminophen]    Social History   Social History  . Marital Status: Married    Spouse Name: N/A  . Number of Children: N/A  . Years of Education: N/A   Occupational History  . Not on file.   Social History Main Topics  . Smoking status: Never Smoker   . Smokeless tobacco: Not on file  . Alcohol Use: Not on file  . Drug Use: Not on file  . Sexual Activity: Not on file   Other Topics Concern  . Not on file   Social History Narrative  Review of Systems  All other systems reviewed and are negative.      Objective:   Physical Exam  Constitutional: She appears well-developed and well-nourished.  HENT:  Nose: Nose normal.  Mouth/Throat: Oropharynx is clear and moist.  Neck: Neck supple. No thyromegaly present.  Cardiovascular: Normal rate, regular rhythm and normal heart sounds.   Pulmonary/Chest: Effort normal and breath sounds normal. No respiratory distress. She has no wheezes. She has no rales.  Abdominal: Soft. Bowel sounds are normal. She exhibits no distension. There is no tenderness. There is no rebound and no guarding.  Lymphadenopathy:    She has no cervical adenopathy.  Skin: Rash noted. There is erythema.  Vitals reviewed.         Assessment & Plan:  Benign essential HTN - Plan: CBC with  Differential/Platelet, COMPLETE METABOLIC PANEL WITH GFR, Lipid panel  Other specified hypothyroidism - Plan: TSH  Dermatitis  If this is lichen planus, I will discontinue losartan and start the patient on hydrochlorothiazide 25 mg by mouth daily. If rash persists greater than one month, I will consult dermatology. I will also check a TSH. Patient is due for a mammogram and I will schedule this as well.

## 2015-09-11 ENCOUNTER — Other Ambulatory Visit: Payer: Self-pay | Admitting: Family Medicine

## 2015-09-11 DIAGNOSIS — M7591 Shoulder lesion, unspecified, right shoulder: Secondary | ICD-10-CM

## 2015-09-11 DIAGNOSIS — M25511 Pain in right shoulder: Secondary | ICD-10-CM

## 2015-09-18 ENCOUNTER — Ambulatory Visit
Admission: RE | Admit: 2015-09-18 | Discharge: 2015-09-18 | Disposition: A | Discharge status: Home / Self Care | Payer: Medicare HMO | Source: Ambulatory Visit | Attending: Family Medicine | Admitting: Family Medicine

## 2015-09-18 DIAGNOSIS — M19011 Primary osteoarthritis, right shoulder: Secondary | ICD-10-CM | POA: Diagnosis not present

## 2015-09-18 DIAGNOSIS — M7591 Shoulder lesion, unspecified, right shoulder: Secondary | ICD-10-CM

## 2015-09-18 DIAGNOSIS — M25511 Pain in right shoulder: Secondary | ICD-10-CM

## 2015-09-20 ENCOUNTER — Encounter: Payer: Self-pay | Admitting: Family Medicine

## 2015-10-23 ENCOUNTER — Ambulatory Visit
Admission: RE | Admit: 2015-10-23 | Discharge: 2015-10-23 | Disposition: A | Discharge status: Home / Self Care | Payer: Medicare HMO | Source: Ambulatory Visit | Attending: Family Medicine | Admitting: Family Medicine

## 2015-10-23 DIAGNOSIS — Z1231 Encounter for screening mammogram for malignant neoplasm of breast: Secondary | ICD-10-CM

## 2015-11-14 ENCOUNTER — Encounter: Payer: Self-pay | Admitting: Family Medicine

## 2016-02-14 ENCOUNTER — Encounter (HOSPITAL_COMMUNITY): Payer: Self-pay | Admitting: *Deleted

## 2016-02-14 ENCOUNTER — Emergency Department (HOSPITAL_COMMUNITY)
Admission: EM | Admit: 2016-02-14 | Discharge: 2016-02-14 | Disposition: A | Discharge status: Home / Self Care | Payer: Medicare HMO | Attending: Emergency Medicine | Admitting: Emergency Medicine

## 2016-02-14 ENCOUNTER — Emergency Department (HOSPITAL_COMMUNITY): Payer: Medicare HMO

## 2016-02-14 DIAGNOSIS — N23 Unspecified renal colic: Secondary | ICD-10-CM | POA: Diagnosis not present

## 2016-02-14 DIAGNOSIS — Z79899 Other long term (current) drug therapy: Secondary | ICD-10-CM | POA: Diagnosis not present

## 2016-02-14 DIAGNOSIS — Z7982 Long term (current) use of aspirin: Secondary | ICD-10-CM | POA: Insufficient documentation

## 2016-02-14 DIAGNOSIS — I10 Essential (primary) hypertension: Secondary | ICD-10-CM | POA: Diagnosis not present

## 2016-02-14 DIAGNOSIS — N201 Calculus of ureter: Secondary | ICD-10-CM | POA: Diagnosis not present

## 2016-02-14 DIAGNOSIS — R109 Unspecified abdominal pain: Secondary | ICD-10-CM

## 2016-02-14 DIAGNOSIS — E039 Hypothyroidism, unspecified: Secondary | ICD-10-CM | POA: Diagnosis not present

## 2016-02-14 DIAGNOSIS — N1330 Unspecified hydronephrosis: Secondary | ICD-10-CM | POA: Diagnosis not present

## 2016-02-14 LAB — URINALYSIS, ROUTINE W REFLEX MICROSCOPIC
Bilirubin Urine: NEGATIVE
Glucose, UA: NEGATIVE mg/dL
Ketones, ur: NEGATIVE mg/dL
NITRITE: NEGATIVE
PH: 6 (ref 5.0–8.0)
Protein, ur: NEGATIVE mg/dL
Specific Gravity, Urine: 1.019 (ref 1.005–1.030)

## 2016-02-14 LAB — CBC
HCT: 44.1 % (ref 36.0–46.0)
HEMOGLOBIN: 14.5 g/dL (ref 12.0–15.0)
MCH: 31 pg (ref 26.0–34.0)
MCHC: 32.9 g/dL (ref 30.0–36.0)
MCV: 94.4 fL (ref 78.0–100.0)
Platelets: 239 10*3/uL (ref 150–400)
RBC: 4.67 MIL/uL (ref 3.87–5.11)
RDW: 13.3 % (ref 11.5–15.5)
WBC: 9.5 10*3/uL (ref 4.0–10.5)

## 2016-02-14 LAB — BASIC METABOLIC PANEL
ANION GAP: 8 (ref 5–15)
BUN: 19 mg/dL (ref 6–20)
CHLORIDE: 108 mmol/L (ref 101–111)
CO2: 20 mmol/L — ABNORMAL LOW (ref 22–32)
CREATININE: 1.16 mg/dL — AB (ref 0.44–1.00)
Calcium: 9 mg/dL (ref 8.9–10.3)
GFR calc non Af Amer: 47 mL/min — ABNORMAL LOW (ref >60)
GFR, EST AFRICAN AMERICAN: 54 mL/min — AB (ref >60)
Glucose, Bld: 117 mg/dL — ABNORMAL HIGH (ref 65–99)
POTASSIUM: 4.2 mmol/L (ref 3.5–5.1)
SODIUM: 136 mmol/L (ref 135–145)

## 2016-02-14 LAB — URINE MICROSCOPIC-ADD ON

## 2016-02-14 MED ORDER — OXYCODONE HCL 5 MG PO TABS: 5.0000 mg | ORAL_TABLET | Freq: Four times a day (QID) | ORAL | 0 refills | Status: DC | PRN

## 2016-02-14 MED ORDER — FENTANYL CITRATE (PF) 100 MCG/2ML IJ SOLN
50.0000 ug | INTRAMUSCULAR | Status: DC | PRN
  Administered 2016-02-14 (×2): 50 ug via INTRAVENOUS
  Filled 2016-02-14 (×2): qty 2

## 2016-02-14 MED ORDER — NITROFURANTOIN MONOHYD MACRO 100 MG PO CAPS: 100.0000 mg | ORAL_CAPSULE | Freq: Two times a day (BID) | ORAL | 0 refills | Status: DC

## 2016-02-14 MED ORDER — ONDANSETRON HCL 4 MG/2ML IJ SOLN
4.0000 mg | INTRAMUSCULAR | Status: DC | PRN
  Administered 2016-02-14: 4 mg via INTRAVENOUS
  Filled 2016-02-14: qty 2

## 2016-02-14 MED ORDER — TAMSULOSIN HCL 0.4 MG PO CAPS: 0.4000 mg | ORAL_CAPSULE | Freq: Every day | ORAL | 0 refills | Status: DC

## 2016-02-14 MED ORDER — SODIUM CHLORIDE 0.9 % IV BOLUS (SEPSIS)
1000.0000 mL | Freq: Once | INTRAVENOUS | Status: AC
  Administered 2016-02-14: 1000 mL via INTRAVENOUS

## 2016-02-14 NOTE — ED Notes (Signed)
In [place]

## 2016-02-14 NOTE — ED Provider Notes (Signed)
[facility]-EMERGENCY DEPT Provider Note   CSN: [id number] Arrival date & time: 02/14/16  0707     History   Chief Complaint Chief Complaint  Patient presents with  . Flank Pain    HPI Jill [name] is a 70 y.o. female.  The history is provided by the patient.  Flank Pain  This is a new problem. The current episode started 3 to 5 hours ago. The problem occurs constantly. The problem has not changed since onset.Pertinent negatives include no chest pain and no abdominal pain. Associated symptoms comments: Low back pain, dysuria, hematuria, frequency, urgency. Nothing aggravates the symptoms. Nothing relieves the symptoms. She has tried nothing for the symptoms.    Past Medical History:  Diagnosis Date  . Hypertension   . Thyroid disease    hypothyroid    Patient Active Problem List   Diagnosis Date Noted  . 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 Acute bronchitis 12/29/2012  . HYPOTHYROIDISM 09/10/2009  . HYPERTENSION 09/10/2009  . ALLERGIC RHINITIS 09/10/2009    Past Surgical History:  Procedure Laterality Date  . ABDOMINAL HYSTERECTOMY    . CATARACT EXTRACTION    . KNEE SURGERY Left    11 surgery   . SHOULDER SURGERY Bilateral     OB History    No data available       Home Medications    Prior to Admission medications   Medication Sig Start Date End Date Taking? Authorizing Provider  antiseptic oral rinse (BIOTENE) LIQD 15 mLs by Mouth Rinse route as needed.    Historical Provider, MD  aspirin 81 MG tablet Take 81 mg by mouth daily.    Historical Provider, MD  clobetasol cream (TEMOVATE) 0.05 % Apply 1 application topically 2 (two) times daily. 06/20/15   Salley ScarletKawanta F Northumberland, MD  diclofenac sodium (VOLTAREN) 1 % GEL Apply 2 g topically 4 (four) times daily. 01/16/15   Donita BrooksWarren T Pickard, MD  docusate sodium (COLACE) 100 MG capsule Take 100 mg by mouth 2 (two) times daily.    Historical Provider, MD  fexofenadine (ALLEGRA) 180 MG tablet Take 1 tablet (180 mg  total) by mouth daily. 10/20/13   Donita BrooksWarren T Pickard, MD  Ginkgo Biloba 40 MG TABS Take by mouth.    Historical Provider, MD  hydrochlorothiazide (HYDRODIURIL) 25 MG tablet Take 1 tablet (25 mg total) by mouth daily. 09/10/15   Donita BrooksWarren T Pickard, MD  hydrOXYzine (ATARAX/VISTARIL) 25 MG tablet Take 1 tablet (25 mg total) by mouth 3 (three) times daily as needed. 06/29/15   Donita BrooksWarren T Pickard, MD  levothyroxine (SYNTHROID, LEVOTHROID) 100 MCG tablet Take 1 tablet (100 mcg total) by mouth daily. Patient taking differently: Take 100 mcg by mouth daily. Alternates QOD 11/16/14   Donita BrooksWarren T Pickard, MD  levothyroxine (SYNTHROID, LEVOTHROID) 112 MCG tablet Take 1 tablet (112 mcg total) by mouth daily before breakfast. Patient taking differently: Take 112 mcg by mouth daily before breakfast. Alternates QOD 11/16/14   Donita BrooksWarren T Pickard, MD  losartan (COZAAR) 100 MG tablet Take one tablet by mouth one time daily 11/16/14   Donita BrooksWarren T Pickard, MD  mirtazapine (REMERON) 15 MG tablet Take 1 tablet (15 mg total) by mouth at bedtime. 06/20/15   Salley ScarletKawanta F Ambia, MD  Multiple Vitamins-Minerals (AIRBORNE PO) Take 2 tablets by mouth daily.    Historical Provider, MD  naproxen (NAPROSYN) 250 MG tablet Take 1 tablet (250 mg total) by mouth 2 (two) times daily as needed for mild pain, moderate pain or headache (TAKE WITH  MEALS.). 06/07/14   Mercedes Camprubi-Soms, PA-C    Family History Family History  Problem Relation Age of Onset  . Heart disease Father   . Heart disease Brother   . Heart disease Paternal Grandfather   . Heart disease Brother     Social History Social History  Substance Use Topics  . Smoking status: Never Smoker  . Smokeless tobacco: Not on file  . Alcohol use Not on file     Allergies   Penicillins and Tylenol [acetaminophen]   Review of Systems Review of Systems  Cardiovascular: Negative for chest pain.  Gastrointestinal: Negative for abdominal pain.  Genitourinary: Positive for flank pain.    All other systems reviewed and are negative.    Physical Exam Updated Vital Signs BP 99/87 (BP Location: Left Arm)   Pulse 79   Temp 98.2 F (36.8 C) (Oral)   Resp 18   SpO2 96%   Physical Exam  Constitutional: She is oriented to person, place, and time. She appears well-developed and well-nourished. She appears distressed (uncomfortable and moaning 2/2 pain).  HENT:  Head: Normocephalic.  Eyes: Conjunctivae are normal.  Neck: Neck supple. No tracheal deviation present.  Cardiovascular: Normal rate and regular rhythm.   Pulmonary/Chest: Effort normal. No respiratory distress.  Abdominal: Soft. Normal appearance. She exhibits no distension. There is no tenderness. There is CVA tenderness (on left). There is no rigidity, no guarding, no tenderness at McBurney's point and negative Murphy's sign.  Neurological: She is alert and oriented to person, place, and time.  Skin: Skin is warm and dry.  Psychiatric: She has a normal mood and affect.     ED Treatments / Results  Labs (all labs ordered are listed, but only abnormal results are displayed) Labs Reviewed  URINE CULTURE  CBC  URINALYSIS, ROUTINE W REFLEX MICROSCOPIC (NOT AT [facility])  BASIC METABOLIC PANEL    EKG  EKG Interpretation None       Radiology Ct Renal Stone Study  Result Date: 02/14/2016 CLINICAL DATA:  Sudden onset of low back pain starting this morning EXAM: CT ABDOMEN AND PELVIS WITHOUT CONTRAST TECHNIQUE: Multidetector CT imaging of the abdomen and pelvis was performed following the standard protocol without IV contrast. COMPARISON:  None. FINDINGS: Lower chest:  The lung bases are unremarkable Hepatobiliary: Unenhanced liver is unremarkable. No calcified gallstones are noted within gallbladder. Pancreas: Unenhanced pancreas is mild atrophic. There is nodular fullness of the pancreatic tail measures about 2.7 cm. A pancreatic mass cannot be excluded further correlation with enhanced MRI is recommended. Some  punctate peripheral calcifications are noted in pancreatic tail region. Spleen: Unenhanced spleen is unremarkable. Adrenals/Urinary Tract: There is mild left hydronephrosis an left hydroureter. Mild left perinephric stranding. There is indeterminate high-density lesion in upper pole of the left kidney measures 9 mm. Although may represent high-density cyst a solid lesion cannot be excluded. Attention should be given on follow-up MRI no right hydronephrosis. No calcified ureteral calculi are noted bilaterally. Axial image 81 there is calcified obstructive calculus in left UVJ measures 3.5 mm. Stomach/Bowel: No small bowel obstruction. No gastric outlet obstruction. Abundant stool noted within cecum. 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 Moderate stool noted within transverse colon. No distal colonic obstruction. No acute colitis or diverticulitis. Vascular/Lymphatic: No aortic aneurysm. No retroperitoneal or mesenteric adenopathy. Reproductive: The patient is status post hysterectomy. Limited assessment of the urinary bladder which is empty. No calcified calculi are noted within urinary bladder. Other: No ascites or  free abdominal air. There is a umbilical region hernia containing omental fat without evidence of acute complication. Musculoskeletal: No destructive bony lesions are noted. Mild degenerative changes lower thoracic spine. IMPRESSION: 1. There is mild left hydronephrosis and left hydroureter. Mild left perinephric stranding. There is 3.5 mm calcified obstructive calculus in left UVJ. 2. Indeterminate high-density lesion in upper pole of the left kidney measures 9 mm. Attention should be given on follow-up MRI. 3. There is nodular prominence of pancreatic tail region measures at least 2.7 cm. A pancreatic mass cannot be excluded. Further correlation with enhanced MRI is recommended. 4. No right hydronephrosis or hydroureter. No proximal ureteral calculi  are noted bilaterally. 5. Abundant stool noted within right colon and cecum. No pericecal inflammation. 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 6. No small bowel obstruction. Electronically Signed   By: Natasha MeadLiviu  Pop M.D.   On: 02/14/2016 10:46    Procedures Procedures (including critical care time)  Emergency Focused Ultrasound Exam Limited Retroperitoneal Ultrasound of Kidneys  Performed and interpreted by Dr. Clydene PughKnott Focused abdominal ultrasound with both kidneys imaged in transverse and longitudinal planes in real-time. Indication: flank pain Findings: bilateral kidneys present, no shadowing, no anechoic areas Interpretation: no hydronephrosis visualized.  no stones or cysts visualized  Images archived electronically  CPT Code: [id number]   Medications Ordered in ED Medications - No data to display   Initial Impression / Assessment and Plan / ED Course  I have reviewed the triage vital signs and the nursing notes.  Pertinent labs & imaging results that were available during my care of the patient were reviewed by me and considered in my medical decision making (see chart for details).  Clinical Course    70 year old female presents with typical UTI symptoms of frequency, dysuria, hematuria, decreasing amounts with each episode of urination and low back pain. Overnight her symptoms progressed to acute left flank pain. Symptoms are suspicious for development of ascending urinary tract infection and pyelonephritis. Bedside ultrasound shows no evidence of obstruction, abscess or other complication. No leukocytosis, no fever, no tachycardia to suggest developing sepsis currently.  Urine is negative for white blood cells but has had hematuria. CT ordered to evaluate for stone as cause of pain.   CT shows obstructing stone and mild hydro not evident on limited bedside US study. Pt should be able to pass this stone. D/t preceding typical symptoms ppx macrobid was given for possible concomitant  infection. Plan for pain control, flomax therapy pending urology evaluation as an outpatient. Return precautions discussed for worsening or new concerning symptoms.   Final Clinical Impressions(s) / ED Diagnoses   Final diagnoses:  Ureteral colic  Left flank pain    New Prescriptions Discharge Medication List as of 02/14/2016 12:09 PM    START taking these medications   Details  nitrofurantoin, macrocrystal-monohydrate, (MACROBID) 100 MG capsule Take 1 capsule (100 mg total) by mouth 2 (two) times daily., Starting Thu 02/14/2016, Print    oxyCODONE (ROXICODONE) 5 MG immediate release tablet Take 1 tablet (5 mg total) by mouth every 6 (six) hours as needed for severe pain., Starting Thu 02/14/2016, Print    tamsulosin (FLOMAX) 0.4 MG CAPS capsule Take 1 capsule (0.4 mg total) by mouth daily., Starting Thu 02/14/2016, Print         Lyndal Pulleyaniel Tashonda Pinkus, MD 02/15/16 [phone]

## 2016-02-14 NOTE — ED Notes (Signed)
D/C instructions reviewed with patient and husband.  SHe denies any farther questions

## 2016-02-14 NOTE — ED Notes (Signed)
Pt. Worried about bruise on L arm Assessed

## 2016-02-14 NOTE — ED Triage Notes (Signed)
Pt reports onset this am between 2-3am of left flank and lower back pain. Having freq urination but only urinates small amount. Denies fever or n/v.

## 2016-02-14 NOTE — ED Notes (Signed)
EDP at bedside  

## 2016-02-15 LAB — URINE CULTURE

## 2016-03-13 DIAGNOSIS — H5212 Myopia, left eye: Secondary | ICD-10-CM | POA: Diagnosis not present

## 2016-04-24 DIAGNOSIS — R69 Illness, unspecified: Secondary | ICD-10-CM | POA: Diagnosis not present

## 2016-04-25 ENCOUNTER — Other Ambulatory Visit: Payer: Self-pay | Admitting: Family Medicine

## 2016-05-01 DIAGNOSIS — R69 Illness, unspecified: Secondary | ICD-10-CM | POA: Diagnosis not present

## 2016-06-05 DIAGNOSIS — R69 Illness, unspecified: Secondary | ICD-10-CM | POA: Diagnosis not present

## 2016-06-06 ENCOUNTER — Other Ambulatory Visit: Payer: Self-pay | Admitting: Family Medicine

## 2016-06-06 MED ORDER — MIRTAZAPINE 30 MG PO TABS: 15.0000 mg | ORAL_TABLET | Freq: Every day | ORAL | 1 refills | Status: DC

## 2016-06-17 ENCOUNTER — Other Ambulatory Visit: Payer: Medicare HMO

## 2016-06-17 DIAGNOSIS — I10 Essential (primary) hypertension: Secondary | ICD-10-CM | POA: Diagnosis not present

## 2016-06-17 DIAGNOSIS — Z79899 Other long term (current) drug therapy: Secondary | ICD-10-CM | POA: Diagnosis not present

## 2016-06-17 DIAGNOSIS — E039 Hypothyroidism, unspecified: Secondary | ICD-10-CM | POA: Diagnosis not present

## 2016-06-17 LAB — CBC WITH DIFFERENTIAL/PLATELET
BASOS PCT: 1 %
Basophils Absolute: 82 cells/uL (ref 0–200)
EOS PCT: 4 %
Eosinophils Absolute: 328 cells/uL (ref 15–500)
HCT: 44.6 % (ref 35.0–45.0)
Hemoglobin: 14.7 g/dL (ref 12.0–15.0)
Lymphocytes Relative: 52 %
Lymphs Abs: 4264 cells/uL — ABNORMAL HIGH (ref 850–3900)
MCH: 30.8 pg (ref 27.0–33.0)
MCHC: 33 g/dL (ref 32.0–36.0)
MCV: 93.3 fL (ref 80.0–100.0)
MONOS PCT: 6 %
MPV: 10.1 fL (ref 7.5–12.5)
Monocytes Absolute: 492 cells/uL (ref 200–950)
NEUTROS ABS: 3034 {cells}/uL (ref 1500–7800)
Neutrophils Relative %: 37 %
PLATELETS: 268 10*3/uL (ref 140–400)
RBC: 4.78 MIL/uL (ref 3.80–5.10)
RDW: 13.3 % (ref 11.0–15.0)
WBC: 8.2 10*3/uL (ref 3.8–10.8)

## 2016-06-17 LAB — COMPLETE METABOLIC PANEL WITH GFR
ALT: 14 U/L (ref 6–29)
AST: 18 U/L (ref 10–35)
Albumin: 3.7 g/dL (ref 3.6–5.1)
Alkaline Phosphatase: 46 U/L (ref 33–130)
BILIRUBIN TOTAL: 0.5 mg/dL (ref 0.2–1.2)
BUN: 15 mg/dL (ref 7–25)
CO2: 25 mmol/L (ref 20–31)
CREATININE: 1.23 mg/dL — AB (ref 0.60–0.93)
Calcium: 8.9 mg/dL (ref 8.6–10.4)
Chloride: 103 mmol/L (ref 98–110)
GFR, EST AFRICAN AMERICAN: 51 mL/min — AB (ref >=60)
GFR, Est Non African American: 45 mL/min — ABNORMAL LOW (ref >=60)
Glucose, Bld: 94 mg/dL (ref 70–99)
Potassium: 4 mmol/L (ref 3.5–5.3)
Sodium: 140 mmol/L (ref 135–146)
TOTAL PROTEIN: 6.6 g/dL (ref 6.1–8.1)

## 2016-06-17 LAB — LIPID PANEL
CHOLESTEROL: 172 mg/dL (ref <200)
HDL: 56 mg/dL (ref >50)
LDL CALC: 98 mg/dL (ref <100)
Total CHOL/HDL Ratio: 3.1 Ratio (ref <5.0)
Triglycerides: 91 mg/dL (ref <150)
VLDL: 18 mg/dL (ref <30)

## 2016-06-17 LAB — TSH: TSH: 1.72 mIU/L

## 2016-06-25 ENCOUNTER — Encounter: Payer: Self-pay | Admitting: Family Medicine

## 2016-06-25 ENCOUNTER — Ambulatory Visit (INDEPENDENT_AMBULATORY_CARE_PROVIDER_SITE_OTHER): Payer: Medicare HMO | Admitting: Family Medicine

## 2016-06-25 VITALS — BP 136/90 | HR 80 | Temp 98.4°F | Resp 18 | Ht 63.5 in | Wt 199.0 lb

## 2016-06-25 DIAGNOSIS — Z23 Encounter for immunization: Secondary | ICD-10-CM | POA: Diagnosis not present

## 2016-06-25 DIAGNOSIS — Z780 Asymptomatic menopausal state: Secondary | ICD-10-CM

## 2016-06-25 DIAGNOSIS — E038 Other specified hypothyroidism: Secondary | ICD-10-CM

## 2016-06-25 DIAGNOSIS — I10 Essential (primary) hypertension: Secondary | ICD-10-CM | POA: Diagnosis not present

## 2016-06-25 NOTE — Progress Notes (Signed)
Subjective:    [name], female    DOB: 03-Feb-1946, 70 y.o.   MRN: [id number]  HPI  3/17 I saw the patient last year for a rash on her right shin. 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 I gave her a topical steroid cream but the rash did not improve. In retrospect, the rash is unilateral. However her swelling is symmetric and bilateral. The rash is slightly violaceous in color. There are small flattop polygonal papules. 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 It is very itchy. She is taking an angiotensin receptor blocker. She is overdue for fasting lab work. She is also due to recheck her TSH.  At that time, my plan was: If this is lichen planus, I will discontinue losartan and start the patient on hydrochlorothiazide 25 mg by mouth daily. If rash persists greater than one month, I will consult dermatology. I will also check a TSH. Patient is due for a mammogram and I will schedule this as well.  06/25/16 Patient continues to refuse a flu shot. Her mammogram is up-to-date. She is due for a bone density test and after discussion she will allow me to schedule this. Her blood pressures well controlled. She denies any chest pain shortness of breath or dyspnea on exertion. Her TSH is within therapeutic limits. Unfortunately her kidney function continues to worsen. She has stage III chronic kidney disease. She denies any abuse of NSAIDs. She is drinking plenty of fluid. She is due for Pneumovax 23 as well as her flu shot.  Past Medical History:  Diagnosis Date  . Hypertension   . Thyroid disease    hypothyroid   Past Surgical History:  Procedure Laterality Date  . ABDOMINAL HYSTERECTOMY    . CATARACT EXTRACTION    . KNEE SURGERY Left    11 surgery   . SHOULDER SURGERY Bilateral    Current Outpatient Prescriptions on File Prior to Visit  Medication Sig Dispense Refill  . aspirin 81 MG tablet Take 81 mg by mouth daily.    Marland Kitchen. BIOTIN PO Take 1 tablet by mouth daily.    .  clobetasol cream (TEMOVATE) 0.05 % Apply 1 application topically 2 (two) times daily. (Patient taking differently: Apply 1 application topically 2 (two) times daily as needed (leg pain). ) 30 g 1  . diclofenac sodium (VOLTAREN) 1 % GEL Apply 2 g topically 4 (four) times daily. (Patient not taking: Reported on 02/14/2016) 100 g 0  . docusate sodium (COLACE) 100 MG capsule Take 200 mg by mouth at bedtime.     . fexofenadine (ALLEGRA) 180 MG tablet Take 1 tablet (180 mg total) by mouth daily. 90 tablet 4  . Ginkgo Biloba 40 MG TABS Take 1 tablet by mouth daily.     . hydrochlorothiazide (HYDRODIURIL) 25 MG tablet Take 1 tablet (25 mg total) by mouth daily. 90 tablet 3  . hydrOXYzine (ATARAX/VISTARIL) 25 MG tablet Take 1 tablet (25 mg total) by mouth 3 (three) times daily as needed. (Patient taking differently: Take 25 mg by mouth 3 (three) times daily as needed for anxiety. ) 30 tablet 0  . levothyroxine (SYNTHROID, LEVOTHROID) 100 MCG tablet TAKE ONE TABLET BY MOUTH ONCE DAILY 90 tablet 3  . levothyroxine (SYNTHROID, LEVOTHROID) 112 MCG tablet TAKE ONE TABLET BY MOUTH ONCE DAILY BEFORE  BREAKFAST 90 tablet 3  . losartan (COZAAR) 100 MG tablet Take one tablet by mouth one time daily 90 tablet 3  . mirtazapine (REMERON) 15 MG  tablet Take 1 tablet (15 mg total) by mouth at bedtime. (Patient not taking: Reported on 02/14/2016) 30 tablet 2  . mirtazapine (REMERON) 30 MG tablet Take 0.5 tablets (15 mg total) by mouth at bedtime. 30 tablet 1  . Multiple Vitamins-Minerals (AIRBORNE PO) Take 2 tablets by mouth daily.    . naproxen (NAPROSYN) 250 MG tablet Take 1 tablet (250 mg total) by mouth 2 (two) times daily as needed for mild pain, moderate pain or headache (TAKE WITH MEALS.). (Patient not taking: Reported on 02/14/2016) 20 tablet 0  . nitrofurantoin, macrocrystal-monohydrate, (MACROBID) 100 MG capsule Take 1 capsule (100 mg total) by mouth 2 (two) times daily. 10 capsule 0  . oxyCODONE (ROXICODONE) 5 MG  immediate release tablet Take 1 tablet (5 mg total) by mouth every 6 (six) hours as needed for severe pain. 6 tablet 0  . tamsulosin (FLOMAX) 0.4 MG CAPS capsule Take 1 capsule (0.4 mg total) by mouth daily. 10 capsule 0   No current facility-administered medications on file prior to visit.    Allergies  Allergen Reactions  . Erythromycin Base Other (See Comments)  . Penicillins   . Tylenol [Acetaminophen]   . Cortisone Other (See Comments) and Rash    given injections-skin peels/redness   Social History   Social History  . Marital status: Married    Spouse name: N/A  . Number of children: N/A  . Years of education: N/A   Occupational History  . Not on file.   Social History Main Topics  . Smoking status: Never Smoker  . Smokeless tobacco: Not on file  . Alcohol use Not on file  . Drug use: Unknown  . Sexual activity: Not on file   Other Topics Concern  . Not on file   Social History Narrative  . No narrative on file      Review of Systems  All other systems reviewed and are negative.      Objective:   Physical Exam  Constitutional: She appears well-developed and well-nourished.  HENT:  Nose: Nose normal.  Mouth/Throat: Oropharynx is clear and moist.  Neck: Neck supple. No thyromegaly present.  Cardiovascular: Normal rate, regular rhythm and normal heart sounds.   Pulmonary/Chest: Effort normal and breath sounds normal. No respiratory distress. She has no wheezes. She has no rales.  Abdominal: Soft. Bowel sounds are normal. She exhibits no distension. There is no tenderness. There is no rebound and no guarding.  Lymphadenopathy:    She has no cervical adenopathy.  Skin: Rash noted. There is erythema.  Vitals reviewed.         Assessment & Plan:  Benign essential HTN  Other specified hypothyroidism  Blood pressure is well controlled. Make no changes in her medication at this time. Thyroid medication is appropriately dosed. Patient received a flu  shot as well as Pneumovax 23. Immunizations are up-to-date. She continues to refuse a colonoscopy. She also declines hepatitis C screening. She will allow me to schedule her for a bone density. She also appears to have stage III chronic kidney disease. Will need to monitor this every 6 months

## 2016-06-25 NOTE — Addendum Note (Signed)
Addended by: Legrand RamsWILLIS, Guiselle Mian B on: 06/25/2016 10:15 AM   Modules accepted: Orders

## 2016-07-09 ENCOUNTER — Other Ambulatory Visit: Payer: Medicare HMO

## 2016-09-17 ENCOUNTER — Other Ambulatory Visit: Payer: Self-pay | Admitting: Family Medicine

## 2016-09-17 DIAGNOSIS — I10 Essential (primary) hypertension: Secondary | ICD-10-CM

## 2016-09-17 MED ORDER — HYDROCHLOROTHIAZIDE 25 MG PO TABS: 25.0000 mg | ORAL_TABLET | Freq: Every day | ORAL | 3 refills | Status: DC

## 2016-09-17 NOTE — Telephone Encounter (Signed)
received request form pharm for HCTZ stating pt is no longer using [facility]. Medication called/sent to requested pharmacy

## 2016-10-23 DIAGNOSIS — R69 Illness, unspecified: Secondary | ICD-10-CM | POA: Diagnosis not present

## 2016-12-04 ENCOUNTER — Ambulatory Visit (INDEPENDENT_AMBULATORY_CARE_PROVIDER_SITE_OTHER): Payer: Medicare HMO | Admitting: Family Medicine

## 2016-12-04 ENCOUNTER — Encounter: Payer: Self-pay | Admitting: Family Medicine

## 2016-12-04 VITALS — BP 138/84 | HR 82 | Temp 97.9°F | Resp 16 | Ht 63.5 in | Wt 194.0 lb

## 2016-12-04 DIAGNOSIS — J40 Bronchitis, not specified as acute or chronic: Secondary | ICD-10-CM | POA: Diagnosis not present

## 2016-12-04 MED ORDER — AZITHROMYCIN 250 MG PO TABS: ORAL_TABLET | ORAL | 0 refills | Status: DC

## 2016-12-04 MED ORDER — BENZONATATE 100 MG PO CAPS: 200.0000 mg | ORAL_CAPSULE | Freq: Three times a day (TID) | ORAL | 0 refills | Status: DC | PRN

## 2016-12-04 NOTE — Progress Notes (Signed)
Subjective:    [name], female    DOB: June 18, 1946, 71 y.o.   MRN: [id number]  HPI  3/17 I saw the patient last year for a rash on her right shin. 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 I gave her a topical steroid cream but the rash did not improve. In retrospect, the rash is unilateral. However her swelling is symmetric and bilateral. The rash is slightly violaceous in color. There are small flattop polygonal papules. 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 It is very itchy. She is taking an angiotensin receptor blocker. She is overdue for fasting lab work. She is also due to recheck her TSH.  At that time, my plan was: If this is lichen planus, I will discontinue losartan and start the patient on hydrochlorothiazide 25 mg by mouth daily. If rash persists greater than one month, I will consult dermatology. I will also check a TSH. Patient is due for a mammogram and I will schedule this as well.  06/25/16 Patient continues to refuse a flu shot. Her mammogram is up-to-date. She is due for a bone density test and after discussion she will allow me to schedule this. Her blood pressures well controlled. She denies any chest pain shortness of breath or dyspnea on exertion. Her TSH is within therapeutic limits. Unfortunately her kidney function continues to worsen. She has stage III chronic kidney disease. She denies any abuse of NSAIDs. She is drinking plenty of fluid. She is due for Pneumovax 23 as well as her flu shot.  At that time, my plan was: Blood pressure is well controlled. Make no changes in her medication at this time. Thyroid medication is appropriately dosed. Patient received a flu shot as well as Pneumovax 23. Immunizations are up-to-date. She continues to refuse a colonoscopy. She also declines hepatitis C screening. She will allow me to schedule her for a bone density. She also appears to have stage III chronic kidney disease. Will need to monitor this every 6  months 12/04/16 Reports cough for 5 weeks.  Initially had fever but that subsided after a few days.  However, cough has persisted and even worsened and is now productive of yellow sputum.  She also reports pleurisy and mild dyspnea.  Dneies allergy symptoms, sneezing, rhinorrhea or sinus pain/prressure.     Past Medical History:  Diagnosis Date  . CKD (chronic kidney disease) stage 3, GFR 30-59 ml/min   . Hypertension   . Thyroid disease    hypothyroid   Past Surgical History:  Procedure Laterality Date  . ABDOMINAL HYSTERECTOMY    . CATARACT EXTRACTION    . KNEE SURGERY Left    11 surgery   . SHOULDER SURGERY Bilateral    Current Outpatient Prescriptions on File Prior to Visit  Medication Sig Dispense Refill  . aspirin 81 MG tablet Take 81 mg by mouth daily.    Marland Kitchen. BIOTIN PO Take 1 tablet by mouth daily.    Marland Kitchen. docusate sodium (COLACE) 100 MG capsule Take 200 mg by mouth at bedtime.     . fexofenadine (ALLEGRA) 180 MG tablet Take 1 tablet (180 mg total) by mouth daily. 90 tablet 4  . Ginkgo Biloba 40 MG TABS Take 1 tablet by mouth daily.     . hydrochlorothiazide (HYDRODIURIL) 25 MG tablet Take 1 tablet (25 mg total) by mouth daily. 90 tablet 3  . levothyroxine (SYNTHROID, LEVOTHROID) 100 MCG tablet TAKE ONE TABLET BY MOUTH ONCE DAILY 90 tablet 3  .  levothyroxine (SYNTHROID, LEVOTHROID) 112 MCG tablet TAKE ONE TABLET BY MOUTH ONCE DAILY BEFORE  BREAKFAST 90 tablet 3  . Multiple Vitamins-Minerals (AIRBORNE PO) Take 2 tablets by mouth daily.    Marland Kitchen oxyCODONE (ROXICODONE) 5 MG immediate release tablet Take 1 tablet (5 mg total) by mouth every 6 (six) hours as needed for severe pain. 6 tablet 0  . tamsulosin (FLOMAX) 0.4 MG CAPS capsule Take 1 capsule (0.4 mg total) by mouth daily. 10 capsule 0   No current facility-administered medications on file prior to visit.    Allergies  Allergen Reactions  . Erythromycin Base Other (See Comments)  . Penicillins   . Tylenol [Acetaminophen]   .  Cortisone Other (See Comments) and Rash    given injections-skin peels/redness   Social History   Social History  . Marital status: Married    Spouse name: N/A  . Number of children: N/A  . Years of education: N/A   Occupational History  . Not on file.   Social History Main Topics  . Smoking status: Never Smoker  . Smokeless tobacco: Never Used  . Alcohol use Not on file  . Drug use: Unknown  . Sexual activity: Not on file   Other Topics Concern  . Not on file   Social History Narrative  . No narrative on file      Review of Systems  Respiratory: Positive for cough.   All other systems reviewed and are negative.      Objective:   Physical Exam  Constitutional: She appears well-developed and well-nourished.  HENT:  Nose: Nose normal.  Mouth/Throat: Oropharynx is clear and moist.  Neck: Neck supple. No thyromegaly present.  Cardiovascular: Normal rate, regular rhythm and normal heart sounds.   Pulmonary/Chest: Effort normal and breath sounds normal. No respiratory distress. She has no wheezes. She has no rales.  Abdominal: Soft. Bowel sounds are normal. She exhibits no distension. There is no tenderness. There is no rebound and no guarding.  Lymphadenopathy:    She has no cervical adenopathy.  Vitals reviewed.         Assessment & Plan:  Bronchitis - Plan: benzonatate (TESSALON PERLES) 100 MG capsule, azithromycin (ZITHROMAX) 250 MG tablet  Suspect bronchitis. Concerned that the symptoms are worsening after 5 weeks. Begin Z-Pak in addition to Tessalon Perles 200 mg every 8 hours as needed. Recheck next week. If worsening, proceed with chest x-ray immediately. Pulmonary exam today is reassuring.

## 2016-12-15 ENCOUNTER — Other Ambulatory Visit: Payer: Self-pay | Admitting: Family Medicine

## 2016-12-15 DIAGNOSIS — Z1231 Encounter for screening mammogram for malignant neoplasm of breast: Secondary | ICD-10-CM

## 2016-12-25 ENCOUNTER — Ambulatory Visit: Payer: Medicare HMO | Admitting: Family Medicine

## 2017-01-05 ENCOUNTER — Other Ambulatory Visit: Payer: Self-pay | Admitting: Family Medicine

## 2017-01-05 DIAGNOSIS — E2839 Other primary ovarian failure: Secondary | ICD-10-CM

## 2017-01-06 ENCOUNTER — Ambulatory Visit (INDEPENDENT_AMBULATORY_CARE_PROVIDER_SITE_OTHER): Payer: Medicare HMO | Admitting: Family Medicine

## 2017-01-06 ENCOUNTER — Encounter: Payer: Self-pay | Admitting: Family Medicine

## 2017-01-06 VITALS — BP 170/90 | HR 78 | Temp 98.3°F | Resp 16 | Ht 63.5 in | Wt 192.0 lb

## 2017-01-06 DIAGNOSIS — E038 Other specified hypothyroidism: Secondary | ICD-10-CM | POA: Diagnosis not present

## 2017-01-06 DIAGNOSIS — Z1159 Encounter for screening for other viral diseases: Secondary | ICD-10-CM

## 2017-01-06 DIAGNOSIS — I10 Essential (primary) hypertension: Secondary | ICD-10-CM | POA: Diagnosis not present

## 2017-01-06 NOTE — Progress Notes (Signed)
Subjective:    [name], female    DOB: 21-Nov-1945, 71 y.o.   MRN: [id number]  HPI  3/17 I saw the patient last year for a rash on her right shin. 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 I gave her a topical steroid cream but the rash did not improve. In retrospect, the rash is unilateral. However her swelling is symmetric and bilateral. The rash is slightly violaceous in color. There are small flattop polygonal papules. 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 It is very itchy. She is taking an angiotensin receptor blocker. She is overdue for fasting lab work. She is also due to recheck her TSH.  At that time, my plan was: If this is lichen planus, I will discontinue losartan and start the patient on hydrochlorothiazide 25 mg by mouth daily. If rash persists greater than one month, I will consult dermatology. I will also check a TSH. Patient is due for a mammogram and I will schedule this as well.  06/25/16 Patient continues to refuse a flu shot. Her mammogram is up-to-date. She is due for a bone density test and after discussion she will allow me to schedule this. Her blood pressures well controlled. She denies any chest pain shortness of breath or dyspnea on exertion. Her TSH is within therapeutic limits. Unfortunately her kidney function continues to worsen. She has stage III chronic kidney disease. She denies any abuse of NSAIDs. She is drinking plenty of fluid. She is due for Pneumovax 23 as well as her flu shot.  At that time, my plan was: Blood pressure is well controlled. Make no changes in her medication at this time. Thyroid medication is appropriately dosed. Patient received a flu shot as well as Pneumovax 23. Immunizations are up-to-date. She continues to refuse a colonoscopy. She also declines hepatitis C screening. She will allow me to schedule her for a bone density. She also appears to have stage III chronic kidney disease. Will need to monitor this every 6  months  01/06/17 Her mammogram and bone density have been scheduled and are coming up. She continues to refuse a colonoscopy. She would like hepatitis C screening today. Her blood pressure 1 my nurse checked it was very high. I personally checked in her right arm and found to be 146/80. In her left arm and found to be 144/80. She states that she may have eaten more salt yesterday. She is denying any chest pain shortness of breath or dyspnea on exertion. She is due today for lab work. Overall she's been doing well with no concerns  Past Medical History:  Diagnosis Date  . CKD (chronic kidney disease) stage 3, GFR 30-59 ml/min   . Hypertension   . Thyroid disease    hypothyroid   Past Surgical History:  Procedure Laterality Date  . ABDOMINAL HYSTERECTOMY    . CATARACT EXTRACTION    . KNEE SURGERY Left    11 surgery   . SHOULDER SURGERY Bilateral    Current Outpatient Prescriptions on File Prior to Visit  Medication Sig Dispense Refill  . aspirin 81 MG tablet Take 81 mg by mouth daily.    Marland Kitchen BIOTIN PO Take 1 tablet by mouth daily.    Marland Kitchen docusate sodium (COLACE) 100 MG capsule Take 200 mg by mouth at bedtime.     . fexofenadine (ALLEGRA) 180 MG tablet Take 1 tablet (180 mg total) by mouth daily. 90 tablet 4  . Ginkgo Biloba 40 MG TABS Take 1  tablet by mouth daily.     . hydrochlorothiazide (HYDRODIURIL) 25 MG tablet Take 1 tablet (25 mg total) by mouth daily. 90 tablet 3  . levothyroxine (SYNTHROID, LEVOTHROID) 100 MCG tablet TAKE ONE TABLET BY MOUTH ONCE DAILY 90 tablet 3  . levothyroxine (SYNTHROID, LEVOTHROID) 112 MCG tablet TAKE ONE TABLET BY MOUTH ONCE DAILY BEFORE  BREAKFAST 90 tablet 3  . Multiple Vitamins-Minerals (AIRBORNE PO) Take 2 tablets by mouth daily.     No current facility-administered medications on file prior to visit.    Allergies  Allergen Reactions  . Erythromycin Base Other (See Comments)  . Penicillins   . Tylenol [Acetaminophen]   . Cortisone Other (See  Comments) and Rash    given injections-skin peels/redness   Social History   Social History  . Marital status: Married    Spouse name: N/A  . Number of children: N/A  . Years of education: N/A   Occupational History  . Not on file.   Social History Main Topics  . Smoking status: Never Smoker  . Smokeless tobacco: Never Used  . Alcohol use Not on file  . Drug use: Unknown  . Sexual activity: Not on file   Other Topics Concern  . Not on file   Social History Narrative  . No narrative on file      Review of Systems  All other systems reviewed and are negative.      Objective:   Physical Exam  Constitutional: She appears well-developed and well-nourished.  HENT:  Nose: Nose normal.  Mouth/Throat: Oropharynx is clear and moist.  Neck: Neck supple. No thyromegaly present.  Cardiovascular: Normal rate, regular rhythm and normal heart sounds.   Pulmonary/Chest: Effort normal and breath sounds normal. No respiratory distress. She has no wheezes. She has no rales.  Abdominal: Soft. Bowel sounds are normal. She exhibits no distension. There is no tenderness. There is no rebound and no guarding.  Lymphadenopathy:    She has no cervical adenopathy.  Vitals reviewed.         Assessment & Plan:  Benign essential HTN - Plan: COMPLETE METABOLIC PANEL WITH GFR, Lipid panel  Other specified hypothyroidism - Plan: COMPLETE METABOLIC PANEL WITH GFR, Lipid panel, TSH  Encounter for hepatitis C screening test for low risk patient - Plan: Hepatitis C Ab Reflex HCV RNA, QUANT  In accordance with her wishes, I'll screen the patient for hepatitis C. I will also check a TSH for hypothyroidism to ensure that she is on adequate thyroid replacement at her current dose of levothyroxine. Her blood pressure today is borderline. I've asked the patient check her blood pressure everyday for the next week and record the values to that I can review them.  If persistently higher than 140  systolic, I would switch hydrochlorothiazide to Hyzaar

## 2017-01-07 LAB — COMPLETE METABOLIC PANEL WITH GFR
ALT: 14 U/L (ref 6–29)
AST: 17 U/L (ref 10–35)
Albumin: 3.8 g/dL (ref 3.6–5.1)
Alkaline Phosphatase: 59 U/L (ref 33–130)
BILIRUBIN TOTAL: 0.6 mg/dL (ref 0.2–1.2)
BUN: 14 mg/dL (ref 7–25)
CO2: 23 mmol/L (ref 20–31)
CREATININE: 1.03 mg/dL — AB (ref 0.60–0.93)
Calcium: 9.1 mg/dL (ref 8.6–10.4)
Chloride: 103 mmol/L (ref 98–110)
GFR, EST NON AFRICAN AMERICAN: 55 mL/min — AB (ref >=60)
GFR, Est African American: 64 mL/min (ref >=60)
GLUCOSE: 98 mg/dL (ref 70–99)
Potassium: 3.6 mmol/L (ref 3.5–5.3)
SODIUM: 139 mmol/L (ref 135–146)
TOTAL PROTEIN: 6.5 g/dL (ref 6.1–8.1)

## 2017-01-07 LAB — LIPID PANEL
Cholesterol: 189 mg/dL (ref <200)
HDL: 59 mg/dL (ref >50)
LDL Cholesterol: 111 mg/dL — ABNORMAL HIGH (ref <100)
Total CHOL/HDL Ratio: 3.2 Ratio (ref <5.0)
Triglycerides: 93 mg/dL (ref <150)
VLDL: 19 mg/dL (ref <30)

## 2017-01-07 LAB — HEPATITIS C ANTIBODY: HCV Ab: NEGATIVE

## 2017-01-07 LAB — TSH: TSH: 1.86 m[IU]/L

## 2017-01-08 ENCOUNTER — Other Ambulatory Visit: Payer: Medicare HMO

## 2017-01-08 ENCOUNTER — Ambulatory Visit
Admission: RE | Admit: 2017-01-08 | Discharge: 2017-01-08 | Disposition: A | Discharge status: Home / Self Care | Payer: Medicare HMO | Source: Ambulatory Visit | Attending: Family Medicine | Admitting: Family Medicine

## 2017-01-08 ENCOUNTER — Encounter: Payer: Self-pay | Admitting: Family Medicine

## 2017-01-08 DIAGNOSIS — E2839 Other primary ovarian failure: Secondary | ICD-10-CM

## 2017-01-08 DIAGNOSIS — Z1231 Encounter for screening mammogram for malignant neoplasm of breast: Secondary | ICD-10-CM

## 2017-01-08 DIAGNOSIS — Z780 Asymptomatic menopausal state: Secondary | ICD-10-CM | POA: Diagnosis not present

## 2017-01-08 DIAGNOSIS — Z13820 Encounter for screening for osteoporosis: Secondary | ICD-10-CM | POA: Diagnosis not present

## 2017-01-28 ENCOUNTER — Telehealth: Payer: Self-pay | Admitting: Family Medicine

## 2017-01-28 NOTE — Telephone Encounter (Signed)
Pt called and wants to speak with you about her blood pressure. She said that it has been running high.

## 2017-01-29 NOTE — Telephone Encounter (Signed)
Tried to call pt on 01/28/17 but pt could not hear me

## 2017-01-29 NOTE — Telephone Encounter (Signed)
LMTRC

## 2017-01-30 MED ORDER — LOSARTAN POTASSIUM 100 MG PO TABS: 100.0000 mg | ORAL_TABLET | Freq: Every day | ORAL | 3 refills | Status: DC

## 2017-01-30 NOTE — Telephone Encounter (Signed)
Called and spoke to pt and she states that her BP has been running high for the last week. Average is 150-165/ high 80's and would like to know what she should do about it. Please advise.

## 2017-01-30 NOTE — Telephone Encounter (Signed)
I'd add losartan 100 mg poqday

## 2017-01-30 NOTE — Telephone Encounter (Signed)
.  Patient aware of providers recommendations and med sent to requested pharm in [place]

## 2017-04-06 DIAGNOSIS — R69 Illness, unspecified: Secondary | ICD-10-CM | POA: Diagnosis not present

## 2017-04-16 ENCOUNTER — Telehealth: Payer: Self-pay

## 2017-04-16 NOTE — Telephone Encounter (Signed)
Fax from pharmacy Levothyroxine unavailable through manufacturer and patient does not want brand due to cost. Is there an alternative?

## 2017-04-17 NOTE — Telephone Encounter (Signed)
She could alternate 100 mcg one day and 125 mcg the next day to equal out to 112.  She would have to get two rxs.

## 2017-04-21 MED ORDER — LEVOTHYROXINE SODIUM 125 MCG PO TABS: 125.0000 ug | ORAL_TABLET | Freq: Every day | ORAL | 3 refills | Status: DC

## 2017-04-21 MED ORDER — LEVOTHYROXINE SODIUM 100 MCG PO TABS: 100.0000 ug | ORAL_TABLET | Freq: Every day | ORAL | 3 refills | Status: DC

## 2017-06-15 ENCOUNTER — Other Ambulatory Visit: Payer: Self-pay

## 2017-06-15 MED ORDER — LEVOTHYROXINE SODIUM 100 MCG PO TABS: 100.0000 ug | ORAL_TABLET | ORAL | 3 refills | Status: DC

## 2017-06-19 ENCOUNTER — Other Ambulatory Visit: Payer: Self-pay | Admitting: Family Medicine

## 2017-06-19 ENCOUNTER — Other Ambulatory Visit: Payer: Medicare HMO

## 2017-06-19 DIAGNOSIS — E039 Hypothyroidism, unspecified: Secondary | ICD-10-CM | POA: Diagnosis not present

## 2017-06-19 DIAGNOSIS — Z79899 Other long term (current) drug therapy: Secondary | ICD-10-CM | POA: Diagnosis not present

## 2017-06-19 DIAGNOSIS — I10 Essential (primary) hypertension: Secondary | ICD-10-CM

## 2017-06-20 LAB — CBC WITH DIFFERENTIAL/PLATELET
BASOS ABS: 80 {cells}/uL (ref 0–200)
Basophils Relative: 1.1 %
EOS ABS: 467 {cells}/uL (ref 15–500)
Eosinophils Relative: 6.4 %
HEMATOCRIT: 41.7 % (ref 35.0–45.0)
HEMOGLOBIN: 14.1 g/dL (ref 11.7–15.5)
LYMPHS ABS: 3314 {cells}/uL (ref 850–3900)
MCH: 31.3 pg (ref 27.0–33.0)
MCHC: 33.8 g/dL (ref 32.0–36.0)
MCV: 92.5 fL (ref 80.0–100.0)
MPV: 9.7 fL (ref 7.5–12.5)
Monocytes Relative: 7.2 %
NEUTROS ABS: 2913 {cells}/uL (ref 1500–7800)
Neutrophils Relative %: 39.9 %
Platelets: 260 10*3/uL (ref 140–400)
RBC: 4.51 10*6/uL (ref 3.80–5.10)
RDW: 11.8 % (ref 11.0–15.0)
Total Lymphocyte: 45.4 %
WBC: 7.3 10*3/uL (ref 3.8–10.8)
WBCMIX: 526 {cells}/uL (ref 200–950)

## 2017-06-20 LAB — COMPLETE METABOLIC PANEL WITH GFR
AG Ratio: 1.4 (calc) (ref 1.0–2.5)
ALKALINE PHOSPHATASE (APISO): 55 U/L (ref 33–130)
ALT: 13 U/L (ref 6–29)
AST: 19 U/L (ref 10–35)
Albumin: 3.8 g/dL (ref 3.6–5.1)
BILIRUBIN TOTAL: 0.3 mg/dL (ref 0.2–1.2)
BUN / CREAT RATIO: 18 (calc) (ref 6–22)
BUN: 20 mg/dL (ref 7–25)
CHLORIDE: 101 mmol/L (ref 98–110)
CO2: 30 mmol/L (ref 20–32)
CREATININE: 1.14 mg/dL — AB (ref 0.60–0.93)
Calcium: 9.3 mg/dL (ref 8.6–10.4)
GFR, Est African American: 56 mL/min/{1.73_m2} — ABNORMAL LOW (ref >=60)
GFR, Est Non African American: 48 mL/min/{1.73_m2} — ABNORMAL LOW (ref >=60)
GLUCOSE: 105 mg/dL — AB (ref 65–99)
Globulin: 2.7 g/dL (calc) (ref 1.9–3.7)
Potassium: 4.1 mmol/L (ref 3.5–5.3)
SODIUM: 141 mmol/L (ref 135–146)
Total Protein: 6.5 g/dL (ref 6.1–8.1)

## 2017-06-20 LAB — LIPID PANEL
CHOL/HDL RATIO: 3.4 (calc) (ref <5.0)
CHOLESTEROL: 185 mg/dL (ref <200)
HDL: 55 mg/dL (ref >50)
LDL CHOLESTEROL (CALC): 105 mg/dL — AB
Non-HDL Cholesterol (Calc): 130 mg/dL (calc) — ABNORMAL HIGH (ref <130)
Triglycerides: 134 mg/dL (ref <150)

## 2017-06-20 LAB — TSH: TSH: 5.11 mIU/L — ABNORMAL HIGH (ref 0.40–4.50)

## 2017-06-26 ENCOUNTER — Ambulatory Visit (INDEPENDENT_AMBULATORY_CARE_PROVIDER_SITE_OTHER): Payer: Medicare HMO | Admitting: Family Medicine

## 2017-06-26 ENCOUNTER — Encounter: Payer: Self-pay | Admitting: Family Medicine

## 2017-06-26 VITALS — BP 134/76 | HR 72 | Temp 98.3°F | Resp 16 | Ht 63.5 in | Wt 195.0 lb

## 2017-06-26 DIAGNOSIS — E038 Other specified hypothyroidism: Secondary | ICD-10-CM

## 2017-06-26 DIAGNOSIS — I10 Essential (primary) hypertension: Secondary | ICD-10-CM

## 2017-06-26 NOTE — Progress Notes (Signed)
Subjective:    [name], female    DOB: 14-Aug-1945, 71 y.o.   MRN: [id number]  Medication Refill      06/25/16 Patient continues to refuse a flu shot. Her mammogram is up-to-date. She is due for a bone density test and after discussion she will allow me to schedule this. Her blood pressures well controlled. She denies any chest pain shortness of breath or dyspnea on exertion. Her TSH is within therapeutic limits. Unfortunately her kidney function continues to worsen. She has stage III chronic kidney disease. She denies any abuse of NSAIDs. She is drinking plenty of fluid. She is due for Pneumovax 23 as well as her flu shot.  At that time, my plan was: Blood pressure is well controlled. Make no changes in her medication at this time. Thyroid medication is appropriately dosed. Patient received a flu shot as well as Pneumovax 23. Immunizations are up-to-date. She continues to refuse a colonoscopy. She also declines hepatitis C screening. She will allow me to schedule her for a bone density. She also appears to have stage III chronic kidney disease. Will need to monitor this every 6 months  01/06/17 Her mammogram and bone density have been scheduled and are coming up. She continues to refuse a colonoscopy. She would like hepatitis C screening today. Her blood pressure when my nurse checked it was very high. I personally checked in her right arm and found to be 146/80. In her left arm and found to be 144/80. She states that she may have eaten more salt yesterday. She is denying any chest pain shortness of breath or dyspnea on exertion. She is due today for lab work. Overall she's been doing well with no concerns.  At that time, my plan was: In accordance with her wishes, I'll screen the patient for hepatitis C. I will also check a TSH for hypothyroidism to ensure that she is on adequate thyroid replacement at her current dose of levothyroxine. Her blood pressure today is borderline.  I've asked the patient check her blood pressure everyday for the next week and record the values to that I can review them.  If persistently higher than 140 systolic, I would switch hydrochlorothiazide to Hyzaar  06/26/17 Patient is here for follow up. Has hypothyroidism, mild CKD, and essential hypertension.  She is currently taking levothyroxine 100-112 g alternating them on a daily basis. Her TSH is slightly elevated but the patient is asymptomatic. Her blood pressure today is much better at 134/76. She denies any chest pain shortness of breath or dyspnea on exertion. Her flu shot is up-to-date Appointment on 06/19/2017  Component Date Value Ref Range Status  . Glucose, Bld 06/19/2017 105* 65 - 99 mg/dL Final   Comment: .            Fasting reference interval . For someone without known diabetes, a glucose value between 100 and 125 mg/dL is consistent with prediabetes and should be confirmed with a follow-up test. .   . BUN 06/19/2017 20  7 - 25 mg/dL Final  . Creat 40/98/1191 1.14* 0.60 - 0.93 mg/dL Final   Comment: For patients >75 years of age, the reference limit for Creatinine is approximately 13% higher for people identified as African-American. .   . GFR, Est Non African American 06/19/2017 48* > OR = 60 mL/min/1.85m2 Final  . GFR, Est African American 06/19/2017 56* > OR = 60 mL/min/1.87m2 Final  . BUN/Creatinine Ratio 06/19/2017 18  6 - 22 (  calc) Final  . Sodium 06/19/2017 141  135 - 146 mmol/L Final  . Potassium 06/19/2017 4.1  3.5 - 5.3 mmol/L Final  . Chloride 06/19/2017 101  98 - 110 mmol/L Final  . CO2 06/19/2017 30  20 - 32 mmol/L Final  . Calcium 06/19/2017 9.3  8.6 - 10.4 mg/dL Final  . Total Protein 06/19/2017 6.5  6.1 - 8.1 g/dL Final  . Albumin 82/95/621312/21/2018 3.8  3.6 - 5.1 g/dL Final  . Globulin 08/65/784612/21/2018 2.7  1.9 - 3.7 g/dL (calc) Final  . AG Ratio 06/19/2017 1.4  1.0 - 2.5 (calc) Final  . Total Bilirubin 06/19/2017 0.3  0.2 - 1.2 mg/dL Final  . Alkaline  phosphatase (APISO) 06/19/2017 55  33 - 130 U/L Final  . AST 06/19/2017 19  10 - 35 U/L Final  . ALT 06/19/2017 13  6 - 29 U/L Final  . Cholesterol 06/19/2017 185  <200 mg/dL Final  . HDL 96/29/528412/21/2018 55  >50 mg/dL Final  . Triglycerides 06/19/2017 134  <150 mg/dL Final  . LDL Cholesterol (Calc) 06/19/2017 105* mg/dL (calc) Final   Comment: Reference range: <100 . Desirable range <100 mg/dL for primary prevention;   <70 mg/dL for patients with CHD or diabetic patients  with > or = 2 CHD risk factors. Marland Kitchen. LDL-C is now calculated using the Martin-Hopkins  calculation, which is a validated novel method providing  better accuracy than the Friedewald equation in the  estimation of LDL-C.  Horald PollenMartin SS et al. Lenox AhrJAMA. 1324;401(022013;310(19): 2061-2068  (http://education.[facility].com/faq/FAQ164)   . Total CHOL/HDL Ratio 06/19/2017 3.4  <7.2<5.0 (calc) Final  . Non-HDL Cholesterol (Calc) 06/19/2017 130* <130 mg/dL (calc) Final   Comment: For patients with diabetes plus 1 major ASCVD risk  factor, treating to a non-HDL-C goal of <100 mg/dL  (LDL-C of <53<70 mg/dL) is considered a therapeutic  option.   . WBC 06/19/2017 7.3  3.8 - 10.8 Thousand/uL Final  . RBC 06/19/2017 4.51  3.80 - 5.10 Million/uL Final  . Hemoglobin 06/19/2017 14.1  11.7 - 15.5 g/dL Final  . HCT 66/44/034712/21/2018 41.7  35.0 - 45.0 % Final  . MCV 06/19/2017 92.5  80.0 - 100.0 fL Final  . MCH 06/19/2017 31.3  27.0 - 33.0 pg Final  . MCHC 06/19/2017 33.8  32.0 - 36.0 g/dL Final  . RDW 42/59/563812/21/2018 11.8  11.0 - 15.0 % Final  . Platelets 06/19/2017 260  140 - 400 Thousand/uL Final  . MPV 06/19/2017 9.7  7.5 - 12.5 fL Final  . Neutro Abs 06/19/2017 2,913  1,500 - 7,800 cells/uL Final  . Lymphs Abs 06/19/2017 3,314  850 - 3,900 cells/uL Final  . WBC mixed population 06/19/2017 526  200 - 950 cells/uL Final  . Eosinophils Absolute 06/19/2017 467  15 - 500 cells/uL Final  . Basophils Absolute 06/19/2017 80  0 - 200 cells/uL Final  . Neutrophils  Relative % 06/19/2017 39.9  % Final  . Total Lymphocyte 06/19/2017 45.4  % Final  . Monocytes Relative 06/19/2017 7.2  % Final  . Eosinophils Relative 06/19/2017 6.4  % Final  . Basophils Relative 06/19/2017 1.1  % Final  . TSH 06/19/2017 5.11* 0.40 - 4.50 mIU/L Final     Past Medical History:  Diagnosis Date  . CKD (chronic kidney disease) stage 3, GFR 30-59 ml/min   . Hypertension   . Thyroid disease    hypothyroid   Past Surgical History:  Procedure Laterality Date  . ABDOMINAL HYSTERECTOMY    . CATARACT  EXTRACTION    . KNEE SURGERY Left    11 surgery   . SHOULDER SURGERY Bilateral    Current Outpatient Medications on File Prior to Visit  Medication Sig Dispense Refill  . aspirin 81 MG tablet Take 81 mg by mouth daily.    Marland Kitchen. BIOTIN PO Take 1 tablet by mouth daily.    Marland Kitchen. docusate sodium (COLACE) 100 MG capsule Take 200 mg by mouth at bedtime.     . fexofenadine (ALLEGRA) 180 MG tablet Take 1 tablet (180 mg total) by mouth daily. 90 tablet 4  . Ginkgo Biloba 40 MG TABS Take 1 tablet by mouth daily.     . hydrochlorothiazide (HYDRODIURIL) 25 MG tablet Take 1 tablet (25 mg total) by mouth daily. 90 tablet 3  . levothyroxine (SYNTHROID, LEVOTHROID) 100 MCG tablet Take 1 tablet (100 mcg total) by mouth every other day. 90 tablet 3  . levothyroxine (SYNTHROID, LEVOTHROID) 125 MCG tablet Take 1 tablet (125 mcg total) by mouth daily. 90 tablet 3  . losartan (COZAAR) 100 MG tablet Take 1 tablet (100 mg total) by mouth daily. 90 tablet 3  . Multiple Vitamins-Minerals (AIRBORNE PO) Take 2 tablets by mouth daily.     No current facility-administered medications on file prior to visit.    Allergies  Allergen Reactions  . Erythromycin Base Other (See Comments)  . Penicillins   . Tylenol [Acetaminophen]   . Cortisone Other (See Comments) and Rash    given injections-skin peels/redness   Social History   Socioeconomic History  . Marital status: Married    Spouse name: Not on file    . Number of children: Not on file  . Years of education: Not on file  . Highest education level: Not on file  Social Needs  . Financial resource strain: Not on file  . Food insecurity - worry: Not on file  . Food insecurity - inability: Not on file  . Transportation needs - medical: Not on file  . Transportation needs - non-medical: Not on file  Occupational History  . Not on file  Tobacco Use  . Smoking status: Never Smoker  . Smokeless tobacco: Never Used  Substance and Sexual Activity  . Alcohol use: Not on file  . Drug use: Not on file  . Sexual activity: Not on file  Other Topics Concern  . Not on file  Social History Narrative  . Not on file      Review of Systems  All other systems reviewed and are negative.      Objective:   Physical Exam  Constitutional: She appears well-developed and well-nourished.  HENT:  Nose: Nose normal.  Mouth/Throat: Oropharynx is clear and moist.  Neck: Neck supple. No thyromegaly present.  Cardiovascular: Normal rate, regular rhythm and normal heart sounds.  Pulmonary/Chest: Effort normal and breath sounds normal. No respiratory distress. She has no wheezes. She has no rales.  Abdominal: Soft. Bowel sounds are normal. She exhibits no distension. There is no tenderness. There is no rebound and no guarding.  Lymphadenopathy:    She has no cervical adenopathy.  Vitals reviewed.         Assessment & Plan:  Benign essential HTN  Other specified hypothyroidism Her blood pressures acceptable. Patient is asymptomatic and prefers to stay on her current regimen of levothyroxine 100 g alternated with 112 g daily. We'll make no changes in her medicine and recheck her again in 6 months

## 2017-07-02 ENCOUNTER — Other Ambulatory Visit: Payer: Self-pay | Admitting: Family Medicine

## 2017-07-09 ENCOUNTER — Other Ambulatory Visit: Payer: Self-pay | Admitting: Family Medicine

## 2017-07-26 ENCOUNTER — Other Ambulatory Visit: Payer: Self-pay | Admitting: Family Medicine

## 2017-07-26 DIAGNOSIS — I10 Essential (primary) hypertension: Secondary | ICD-10-CM

## 2017-09-11 DIAGNOSIS — H5201 Hypermetropia, right eye: Secondary | ICD-10-CM | POA: Diagnosis not present

## 2017-09-11 DIAGNOSIS — H02831 Dermatochalasis of right upper eyelid: Secondary | ICD-10-CM | POA: Diagnosis not present

## 2017-09-11 DIAGNOSIS — H35372 Puckering of macula, left eye: Secondary | ICD-10-CM | POA: Diagnosis not present

## 2017-09-11 DIAGNOSIS — H02834 Dermatochalasis of left upper eyelid: Secondary | ICD-10-CM | POA: Diagnosis not present

## 2017-11-04 ENCOUNTER — Encounter (HOSPITAL_COMMUNITY): Payer: Self-pay | Admitting: Emergency Medicine

## 2017-11-04 ENCOUNTER — Other Ambulatory Visit: Payer: Self-pay

## 2017-11-04 ENCOUNTER — Emergency Department (HOSPITAL_COMMUNITY)
Admission: EM | Admit: 2017-11-04 | Discharge: 2017-11-05 | Disposition: A | Discharge status: Home / Self Care | Payer: Medicare HMO | Attending: Emergency Medicine | Admitting: Emergency Medicine

## 2017-11-04 DIAGNOSIS — Z5321 Procedure and treatment not carried out due to patient leaving prior to being seen by health care provider: Secondary | ICD-10-CM | POA: Diagnosis not present

## 2017-11-04 DIAGNOSIS — K861 Other chronic pancreatitis: Secondary | ICD-10-CM | POA: Diagnosis not present

## 2017-11-04 DIAGNOSIS — R1032 Left lower quadrant pain: Secondary | ICD-10-CM | POA: Diagnosis not present

## 2017-11-04 DIAGNOSIS — R1030 Lower abdominal pain, unspecified: Secondary | ICD-10-CM | POA: Insufficient documentation

## 2017-11-04 DIAGNOSIS — K5730 Diverticulosis of large intestine without perforation or abscess without bleeding: Secondary | ICD-10-CM | POA: Diagnosis not present

## 2017-11-04 DIAGNOSIS — K828 Other specified diseases of gallbladder: Secondary | ICD-10-CM | POA: Diagnosis not present

## 2017-11-04 DIAGNOSIS — K8689 Other specified diseases of pancreas: Secondary | ICD-10-CM | POA: Diagnosis not present

## 2017-11-04 DIAGNOSIS — R110 Nausea: Secondary | ICD-10-CM | POA: Diagnosis not present

## 2017-11-04 LAB — CBC
HCT: 41.1 % (ref 36.0–46.0)
Hemoglobin: 13.5 g/dL (ref 12.0–15.0)
MCH: 30.8 pg (ref 26.0–34.0)
MCHC: 32.8 g/dL (ref 30.0–36.0)
MCV: 93.6 fL (ref 78.0–100.0)
Platelets: 278 10*3/uL (ref 150–400)
RBC: 4.39 MIL/uL (ref 3.87–5.11)
RDW: 12.7 % (ref 11.5–15.5)
WBC: 10.4 10*3/uL (ref 4.0–10.5)

## 2017-11-04 NOTE — ED Triage Notes (Signed)
Patient here with left flank pain for the last three days.  Patient states that she has had this before and it feels like she has a kidney stone.  She states that she has had them before.  She denies any nausea or vomiting at this time.

## 2017-11-05 DIAGNOSIS — Z90710 Acquired absence of both cervix and uterus: Secondary | ICD-10-CM | POA: Diagnosis not present

## 2017-11-05 DIAGNOSIS — K828 Other specified diseases of gallbladder: Secondary | ICD-10-CM | POA: Diagnosis not present

## 2017-11-05 DIAGNOSIS — Z96652 Presence of left artificial knee joint: Secondary | ICD-10-CM | POA: Diagnosis not present

## 2017-11-05 DIAGNOSIS — K8689 Other specified diseases of pancreas: Secondary | ICD-10-CM | POA: Diagnosis not present

## 2017-11-05 DIAGNOSIS — Z880 Allergy status to penicillin: Secondary | ICD-10-CM | POA: Diagnosis not present

## 2017-11-05 DIAGNOSIS — Z87442 Personal history of urinary calculi: Secondary | ICD-10-CM | POA: Diagnosis not present

## 2017-11-05 DIAGNOSIS — E079 Disorder of thyroid, unspecified: Secondary | ICD-10-CM | POA: Diagnosis not present

## 2017-11-05 DIAGNOSIS — R1032 Left lower quadrant pain: Secondary | ICD-10-CM | POA: Diagnosis not present

## 2017-11-05 DIAGNOSIS — I10 Essential (primary) hypertension: Secondary | ICD-10-CM | POA: Diagnosis not present

## 2017-11-05 DIAGNOSIS — Z881 Allergy status to other antibiotic agents status: Secondary | ICD-10-CM | POA: Diagnosis not present

## 2017-11-05 DIAGNOSIS — K861 Other chronic pancreatitis: Secondary | ICD-10-CM | POA: Diagnosis not present

## 2017-11-05 DIAGNOSIS — R110 Nausea: Secondary | ICD-10-CM | POA: Diagnosis not present

## 2017-11-05 DIAGNOSIS — K5730 Diverticulosis of large intestine without perforation or abscess without bleeding: Secondary | ICD-10-CM | POA: Diagnosis not present

## 2017-11-05 LAB — URINALYSIS, ROUTINE W REFLEX MICROSCOPIC
BILIRUBIN URINE: NEGATIVE
Glucose, UA: NEGATIVE mg/dL
Hgb urine dipstick: NEGATIVE
Ketones, ur: NEGATIVE mg/dL
Leukocytes, UA: NEGATIVE
Nitrite: NEGATIVE
Protein, ur: NEGATIVE mg/dL
SPECIFIC GRAVITY, URINE: 1.02 (ref 1.005–1.030)
pH: 5 (ref 5.0–8.0)

## 2017-11-05 LAB — COMPREHENSIVE METABOLIC PANEL
ALBUMIN: 3.6 g/dL (ref 3.5–5.0)
ALK PHOS: 56 U/L (ref 38–126)
ALT: 18 U/L (ref 14–54)
ANION GAP: 9 (ref 5–15)
AST: 22 U/L (ref 15–41)
BUN: 12 mg/dL (ref 6–20)
CALCIUM: 9.4 mg/dL (ref 8.9–10.3)
CO2: 28 mmol/L (ref 22–32)
Chloride: 100 mmol/L — ABNORMAL LOW (ref 101–111)
Creatinine, Ser: 1.18 mg/dL — ABNORMAL HIGH (ref 0.44–1.00)
GFR calc Af Amer: 52 mL/min — ABNORMAL LOW (ref >60)
GFR calc non Af Amer: 45 mL/min — ABNORMAL LOW (ref >60)
GLUCOSE: 110 mg/dL — AB (ref 65–99)
Potassium: 3.3 mmol/L — ABNORMAL LOW (ref 3.5–5.1)
SODIUM: 137 mmol/L (ref 135–145)
Total Bilirubin: 0.6 mg/dL (ref 0.3–1.2)
Total Protein: 6.6 g/dL (ref 6.5–8.1)

## 2017-11-05 NOTE — ED Notes (Signed)
Patient stated that she was leaving due to long wait time.  Patient's spouse began to smart remarks about the "wait time was suppose to be better with the new design of the waiting room." staff again apologized for the wait.

## 2017-11-10 ENCOUNTER — Encounter: Payer: Self-pay | Admitting: Family Medicine

## 2017-11-10 ENCOUNTER — Ambulatory Visit (INDEPENDENT_AMBULATORY_CARE_PROVIDER_SITE_OTHER): Payer: Medicare HMO | Admitting: Family Medicine

## 2017-11-10 VITALS — BP 134/70 | HR 68 | Temp 98.1°F | Resp 16 | Ht 63.5 in | Wt 200.0 lb

## 2017-11-10 DIAGNOSIS — K529 Noninfective gastroenteritis and colitis, unspecified: Secondary | ICD-10-CM | POA: Diagnosis not present

## 2017-11-10 MED ORDER — METRONIDAZOLE 500 MG PO TABS: 500.0000 mg | ORAL_TABLET | Freq: Two times a day (BID) | ORAL | 0 refills | Status: DC

## 2017-11-10 MED ORDER — CIPROFLOXACIN HCL 500 MG PO TABS: 500.0000 mg | ORAL_TABLET | Freq: Two times a day (BID) | ORAL | 0 refills | Status: DC

## 2017-11-10 NOTE — Progress Notes (Signed)
Subjective:    [name], female    DOB: 11/27/1945, 72 y.o.   MRN: [id number]  Patient was seen in outside emergency room on Saturday after having 4 days of left lower quadrant abdominal pain.  She states that she was extremely tender to palpation in the left lower quadrant.  She denies any fevers.  She denies any melena or hematochezia.  She went to an outside emergency room where she was diagnosed with "inflammation in the colon".  According to the patient she received 1 dose of Flagyl.  She had a CT scan that showed "inflammation in the colon wall".  However she was sent home with ibuprofen only and was instructed to follow-up with me.  Patient states that is long as she takes MiraLAX, the pain has not come back.  However the area that she points to would be consistent with diverticulitis.  She has been pain-free now for 2 days while taking MiraLAX Past Medical History:  Diagnosis Date  . CKD (chronic kidney disease) stage 3, GFR 30-59 ml/min (HCC)   . Hypertension   . Thyroid disease    hypothyroid   Past Surgical History:  Procedure Laterality Date  . ABDOMINAL HYSTERECTOMY    . CATARACT EXTRACTION    . KNEE SURGERY Left    11 surgery   . SHOULDER SURGERY Bilateral    Current Outpatient Medications on File Prior to Visit  Medication Sig Dispense Refill  . aspirin 81 MG tablet Take 81 mg by mouth daily.    Marland Kitchen BIOTIN PO Take 1 tablet by mouth daily.    Marland Kitchen docusate sodium (COLACE) 100 MG capsule Take 200 mg by mouth at bedtime.     . fexofenadine (ALLEGRA) 180 MG tablet Take 1 tablet (180 mg total) by mouth daily. 90 tablet 4  . Ginkgo Biloba 40 MG TABS Take 1 tablet by mouth daily.     . hydrochlorothiazide (HYDRODIURIL) 25 MG tablet TAKE 1 TABLET (25 MG TOTAL) BY MOUTH DAILY. 90 tablet 3  . levothyroxine (SYNTHROID, LEVOTHROID) 100 MCG tablet TAKE 1 TABLET BY MOUTH EVERY DAY BEFORE BREAKFAST 90 tablet 1  . levothyroxine (SYNTHROID, LEVOTHROID) 112 MCG tablet  TAKE 1 TABLET BY MOUTH EVERY DAY 90 tablet 2  . levothyroxine (SYNTHROID, LEVOTHROID) 125 MCG tablet Take 1 tablet (125 mcg total) by mouth daily. 90 tablet 3  . losartan (COZAAR) 100 MG tablet Take 1 tablet (100 mg total) by mouth daily. 90 tablet 3  . Multiple Vitamins-Minerals (AIRBORNE PO) Take 2 tablets by mouth daily.     No current facility-administered medications on file prior to visit.    Allergies  Allergen Reactions  . Erythromycin Base Other (See Comments)  . Penicillins   . Tylenol [Acetaminophen]   . Cortisone Other (See Comments) and Rash    given injections-skin peels/redness   Social History   Socioeconomic History  . Marital status: Married    Spouse name: Not on file  . Number of children: Not on file  . Years of education: Not on file  . Highest education level: Not on file  Occupational History  . Not on file  Social Needs  . Financial resource strain: Not on file  . Food insecurity:    Worry: Not on file    Inability: Not on file  . Transportation needs:    Medical: Not on file    Non-medical: Not on file  Tobacco Use  . Smoking status: Never Smoker  .  Smokeless tobacco: Never Used  Substance and Sexual Activity  . Alcohol use: Not on file  . Drug use: Not on file  . Sexual activity: Not on file  Lifestyle  . Physical activity:    Days per week: Not on file    Minutes per session: Not on file  . Stress: Not on file  Relationships  . Social connections:    Talks on phone: Not on file    Gets together: Not on file    Attends religious service: Not on file    Active member of club or organization: Not on file    Attends meetings of clubs or organizations: Not on file    Relationship status: Not on file  . Intimate partner violence:    Fear of current or ex partner: Not on file    Emotionally abused: Not on file    Physically abused: Not on file    Forced sexual activity: Not on file  Other Topics Concern  . Not on file  Social History  Narrative  . Not on file      Review of Systems  All other systems reviewed and are negative.      Objective:   Physical Exam  Constitutional: She appears well-developed and well-nourished.  HENT:  Nose: Nose normal.  Mouth/Throat: Oropharynx is clear and moist.  Neck: Neck supple. No thyromegaly present.  Cardiovascular: Normal rate, regular rhythm and normal heart sounds.  Pulmonary/Chest: Effort normal and breath sounds normal. No respiratory distress. She has no wheezes. She has no rales.  Abdominal: Soft. Bowel sounds are normal. She exhibits no distension. There is no tenderness. There is no rebound and no guarding.    Lymphadenopathy:    She has no cervical adenopathy.  Vitals reviewed.         Assessment & Plan:  I do not have records to review from the outside hospital however the description the patient provides sounds like colitis versus diverticulitis that has subsequently resolved spontaneously.  Today she feels fine.  I have recommended that she take MiraLAX every day for the next week and then if the pain has not returned, she can discontinue MiraLAX.  If the pain returns, I would like her to start Cipro 500 mg p.o. twice daily and Flagyl 500 mg p.o. twice daily for 10 days and follow-up with me immediately.  We discussed colonoscopy but the patient refuses colonoscopy.

## 2017-12-07 ENCOUNTER — Other Ambulatory Visit: Payer: Self-pay | Admitting: Family Medicine

## 2017-12-07 DIAGNOSIS — I10 Essential (primary) hypertension: Secondary | ICD-10-CM

## 2017-12-07 MED ORDER — HYDROCHLOROTHIAZIDE 25 MG PO TABS: 25.0000 mg | ORAL_TABLET | Freq: Every day | ORAL | 3 refills | Status: DC

## 2017-12-24 ENCOUNTER — Other Ambulatory Visit: Payer: Self-pay | Admitting: Family Medicine

## 2017-12-24 DIAGNOSIS — E039 Hypothyroidism, unspecified: Secondary | ICD-10-CM

## 2017-12-24 DIAGNOSIS — E7800 Pure hypercholesterolemia, unspecified: Secondary | ICD-10-CM

## 2017-12-24 DIAGNOSIS — I10 Essential (primary) hypertension: Secondary | ICD-10-CM

## 2017-12-24 DIAGNOSIS — Z79899 Other long term (current) drug therapy: Secondary | ICD-10-CM

## 2017-12-25 ENCOUNTER — Ambulatory Visit: Payer: Medicare HMO | Admitting: Family Medicine

## 2017-12-25 ENCOUNTER — Other Ambulatory Visit: Payer: Medicare HMO

## 2017-12-25 DIAGNOSIS — Z79899 Other long term (current) drug therapy: Secondary | ICD-10-CM

## 2017-12-25 DIAGNOSIS — I10 Essential (primary) hypertension: Secondary | ICD-10-CM | POA: Diagnosis not present

## 2017-12-25 DIAGNOSIS — E039 Hypothyroidism, unspecified: Secondary | ICD-10-CM | POA: Diagnosis not present

## 2017-12-25 DIAGNOSIS — E7800 Pure hypercholesterolemia, unspecified: Secondary | ICD-10-CM | POA: Diagnosis not present

## 2017-12-25 LAB — CBC WITH DIFFERENTIAL/PLATELET
BASOS ABS: 79 {cells}/uL (ref 0–200)
Basophils Relative: 1.1 %
EOS ABS: 274 {cells}/uL (ref 15–500)
Eosinophils Relative: 3.8 %
HEMATOCRIT: 41.8 % (ref 35.0–45.0)
Hemoglobin: 14.3 g/dL (ref 11.7–15.5)
LYMPHS ABS: 3384 {cells}/uL (ref 850–3900)
MCH: 31.5 pg (ref 27.0–33.0)
MCHC: 34.2 g/dL (ref 32.0–36.0)
MCV: 92.1 fL (ref 80.0–100.0)
MONOS PCT: 6.6 %
MPV: 10 fL (ref 7.5–12.5)
Neutro Abs: 2988 cells/uL (ref 1500–7800)
Neutrophils Relative %: 41.5 %
Platelets: 266 10*3/uL (ref 140–400)
RBC: 4.54 10*6/uL (ref 3.80–5.10)
RDW: 12.1 % (ref 11.0–15.0)
Total Lymphocyte: 47 %
WBC mixed population: 475 cells/uL (ref 200–950)
WBC: 7.2 10*3/uL (ref 3.8–10.8)

## 2017-12-25 LAB — COMPREHENSIVE METABOLIC PANEL
AG Ratio: 1.3 (calc) (ref 1.0–2.5)
ALKALINE PHOSPHATASE (APISO): 53 U/L (ref 33–130)
ALT: 12 U/L (ref 6–29)
AST: 17 U/L (ref 10–35)
Albumin: 3.7 g/dL (ref 3.6–5.1)
BILIRUBIN TOTAL: 0.3 mg/dL (ref 0.2–1.2)
BUN/Creatinine Ratio: 17 (calc) (ref 6–22)
BUN: 21 mg/dL (ref 7–25)
CALCIUM: 9 mg/dL (ref 8.6–10.4)
CO2: 26 mmol/L (ref 20–32)
Chloride: 107 mmol/L (ref 98–110)
Creat: 1.22 mg/dL — ABNORMAL HIGH (ref 0.60–0.93)
Globulin: 2.9 g/dL (calc) (ref 1.9–3.7)
Glucose, Bld: 91 mg/dL (ref 65–99)
Potassium: 4.2 mmol/L (ref 3.5–5.3)
Sodium: 143 mmol/L (ref 135–146)
Total Protein: 6.6 g/dL (ref 6.1–8.1)

## 2017-12-25 LAB — LIPID PANEL
Cholesterol: 167 mg/dL (ref <200)
HDL: 51 mg/dL (ref >50)
LDL Cholesterol (Calc): 99 mg/dL (calc)
Non-HDL Cholesterol (Calc): 116 mg/dL (calc) (ref <130)
TRIGLYCERIDES: 76 mg/dL (ref <150)
Total CHOL/HDL Ratio: 3.3 (calc) (ref <5.0)

## 2017-12-25 LAB — TSH: TSH: 3.34 mIU/L (ref 0.40–4.50)

## 2018-01-01 ENCOUNTER — Encounter: Payer: Self-pay | Admitting: Family Medicine

## 2018-01-01 ENCOUNTER — Ambulatory Visit (INDEPENDENT_AMBULATORY_CARE_PROVIDER_SITE_OTHER): Payer: Medicare HMO | Admitting: Family Medicine

## 2018-01-01 VITALS — BP 110/70 | HR 66 | Temp 98.0°F | Resp 14 | Ht 63.5 in | Wt 200.0 lb

## 2018-01-01 DIAGNOSIS — N183 Chronic kidney disease, stage 3 unspecified: Secondary | ICD-10-CM

## 2018-01-01 DIAGNOSIS — E039 Hypothyroidism, unspecified: Secondary | ICD-10-CM

## 2018-01-01 DIAGNOSIS — I10 Essential (primary) hypertension: Secondary | ICD-10-CM

## 2018-01-01 NOTE — Progress Notes (Signed)
Subjective:    [name], female    DOB: [birth year], 72 y.o.   MRN: [id number]  Medication Refill     Patient is here for follow up. Has hypothyroidism, mild CKD, and essential hypertension.  She is currently taking levothyroxine 100-112 g alternating them on a daily basis. She denies any chest pain shortness of breath or dyspnea on exertion. Her flu shot is up-to-date. She is still overdue for colonoscopy.  She refuses a colonoscopy due to bowel prep however she would be willing to consider Cologuard.  She also reports some pain at the insertion of her Achilles tendon on her left heel.  It is tender to palpation there at night.  She denies any falls or injuries.  It hurts to walk at times most recent labs are listed below: Appointment on 12/25/2017  Component Date Value Ref Range Status  . WBC 12/25/2017 7.2  3.8 - 10.8 Thousand/uL Final  . RBC 12/25/2017 4.54  3.80 - 5.10 Million/uL Final  . Hemoglobin 12/25/2017 14.3  11.7 - 15.5 g/dL Final  . HCT 14/78/2956 41.8  35.0 - 45.0 % Final  . MCV 12/25/2017 92.1  80.0 - 100.0 fL Final  . MCH 12/25/2017 31.5  27.0 - 33.0 pg Final  . MCHC 12/25/2017 34.2  32.0 - 36.0 g/dL Final  . RDW 21/30/8657 12.1  11.0 - 15.0 % Final  . Platelets 12/25/2017 266  140 - 400 Thousand/uL Final  . MPV 12/25/2017 10.0  7.5 - 12.5 fL Final  . Neutro Abs 12/25/2017 2,988  1,500 - 7,800 cells/uL Final  . Lymphs Abs 12/25/2017 3,384  850 - 3,900 cells/uL Final  . WBC mixed population 12/25/2017 475  200 - 950 cells/uL Final  . Eosinophils Absolute 12/25/2017 274  15 - 500 cells/uL Final  . Basophils Absolute 12/25/2017 79  0 - 200 cells/uL Final  . Neutrophils Relative % 12/25/2017 41.5  % Final  . Total Lymphocyte 12/25/2017 47.0  % Final  . Monocytes Relative 12/25/2017 6.6  % Final  . Eosinophils Relative 12/25/2017 3.8  % Final  . Basophils Relative 12/25/2017 1.1  % Final  . Glucose, Bld 12/25/2017 91  65 - 99 mg/dL Final   Comment:  .            Fasting reference interval .   . BUN 12/25/2017 21  7 - 25 mg/dL Final  . Creat 84/69/6295 1.22* 0.60 - 0.93 mg/dL Final   Comment: For patients >37 years of age, the reference limit for Creatinine is approximately 13% higher for people identified as African-American. .   [name] Ratio 12/25/2017 17  6 - 22 (calc) Final  . Sodium 12/25/2017 143  135 - 146 mmol/L Final  . Potassium 12/25/2017 4.2  3.5 - 5.3 mmol/L Final  . Chloride 12/25/2017 107  98 - 110 mmol/L Final  . CO2 12/25/2017 26  20 - 32 mmol/L Final  . Calcium 12/25/2017 9.0  8.6 - 10.4 mg/dL Final  . Total Protein 12/25/2017 6.6  6.1 - 8.1 g/dL Final  . Albumin 28/41/3244 3.7  3.6 - 5.1 g/dL Final  . Globulin 07/02/7251 2.9  1.9 - 3.7 g/dL (calc) Final  . AG Ratio 12/25/2017 1.3  1.0 - 2.5 (calc) Final  . Total Bilirubin 12/25/2017 0.3  0.2 - 1.2 mg/dL Final  . Alkaline phosphatase (APISO) 12/25/2017 53  33 - 130 U/L Final  . AST 12/25/2017 17  10 - 35 U/L Final  .  ALT 12/25/2017 12  6 - 29 U/L Final  . Cholesterol 12/25/2017 167  <200 mg/dL Final  . HDL 16/03/9603 51  >50 mg/dL Final  . Triglycerides 12/25/2017 76  <150 mg/dL Final  . LDL Cholesterol (Calc) 12/25/2017 99  mg/dL (calc) Final   Comment: Reference range: <100 . Desirable range <100 mg/dL for primary prevention;   <70 mg/dL for patients with CHD or diabetic patients  with > or = 2 CHD risk factors. Marland Kitchen LDL-C is now calculated using the Martin-Hopkins  calculation, which is a validated novel method providing  better accuracy than the Friedewald equation in the  estimation of LDL-C.  Horald Pollen et al. Lenox Ahr. 5409;811(91): 2061-2068  (http://education.[facility].com/faq/FAQ164)   . Total CHOL/HDL Ratio 12/25/2017 3.3  <4.7 (calc) Final  . Non-HDL Cholesterol (Calc) 12/25/2017 116  <130 mg/dL (calc) Final   Comment: For patients with diabetes plus 1 major ASCVD risk  factor, treating to a non-HDL-C goal of <100 mg/dL  (LDL-C  of <82 mg/dL) is considered a therapeutic  option.   . TSH 12/25/2017 3.34  0.40 - 4.50 mIU/L Final     Past Medical History:  Diagnosis Date  . CKD (chronic kidney disease) stage 3, GFR 30-59 ml/min (HCC)   . Hypertension   . Thyroid disease    hypothyroid   Past Surgical History:  Procedure Laterality Date  . ABDOMINAL HYSTERECTOMY    . CATARACT EXTRACTION    . KNEE SURGERY Left    11 surgery   . SHOULDER SURGERY Bilateral    Current Outpatient Medications on File Prior to Visit  Medication Sig Dispense Refill  . aspirin 81 MG tablet Take 81 mg by mouth daily.    Marland Kitchen BIOTIN PO Take 1 tablet by mouth daily.    . ciprofloxacin (CIPRO) 500 MG tablet Take 1 tablet (500 mg total) by mouth 2 (two) times daily. 20 tablet 0  . docusate sodium (COLACE) 100 MG capsule Take 200 mg by mouth at bedtime.     . fexofenadine (ALLEGRA) 180 MG tablet Take 1 tablet (180 mg total) by mouth daily. 90 tablet 4  . Ginkgo Biloba 40 MG TABS Take 1 tablet by mouth daily.     . hydrochlorothiazide (HYDRODIURIL) 25 MG tablet Take 1 tablet (25 mg total) by mouth daily. 90 tablet 3  . levothyroxine (SYNTHROID, LEVOTHROID) 100 MCG tablet TAKE 1 TABLET BY MOUTH EVERY DAY BEFORE BREAKFAST 90 tablet 1  . levothyroxine (SYNTHROID, LEVOTHROID) 112 MCG tablet TAKE 1 TABLET BY MOUTH EVERY DAY 90 tablet 2  . levothyroxine (SYNTHROID, LEVOTHROID) 125 MCG tablet Take 1 tablet (125 mcg total) by mouth daily. 90 tablet 3  . losartan (COZAAR) 100 MG tablet Take 1 tablet (100 mg total) by mouth daily. 90 tablet 3  . metroNIDAZOLE (FLAGYL) 500 MG tablet Take 1 tablet (500 mg total) by mouth 2 (two) times daily. 20 tablet 0  . Multiple Vitamins-Minerals (AIRBORNE PO) Take 2 tablets by mouth daily.     No current facility-administered medications on file prior to visit.    Allergies  Allergen Reactions  . Erythromycin Base Other (See Comments)  . Penicillins   . Tylenol [Acetaminophen]   . Cortisone Other (See  Comments) and Rash    given injections-skin peels/redness   Social History   Socioeconomic History  . Marital status: Married    Spouse name: Not on file  . Number of children: Not on file  . Years of education: Not on file  .  Highest education level: Not on file  Occupational History  . Not on file  Social Needs  . Financial resource strain: Not on file  . Food insecurity:    Worry: Not on file    Inability: Not on file  . Transportation needs:    Medical: Not on file    Non-medical: Not on file  Tobacco Use  . Smoking status: Never Smoker  . Smokeless tobacco: Never Used  Substance and Sexual Activity  . Alcohol use: Not on file  . Drug use: Not on file  . Sexual activity: Not on file  Lifestyle  . Physical activity:    Days per week: Not on file    Minutes per session: Not on file  . Stress: Not on file  Relationships  . Social connections:    Talks on phone: Not on file    Gets together: Not on file    Attends religious service: Not on file    Active member of club or organization: Not on file    Attends meetings of clubs or organizations: Not on file    Relationship status: Not on file  . Intimate partner violence:    Fear of current or ex partner: Not on file    Emotionally abused: Not on file    Physically abused: Not on file    Forced sexual activity: Not on file  Other Topics Concern  . Not on file  Social History Narrative  . Not on file      Review of Systems  All other systems reviewed and are negative.      Objective:   Physical Exam  Constitutional: She appears well-developed and well-nourished.  HENT:  Nose: Nose normal.  Mouth/Throat: Oropharynx is clear and moist.  Neck: Neck supple. No thyromegaly present.  Cardiovascular: Normal rate, regular rhythm and normal heart sounds.  Pulmonary/Chest: Effort normal and breath sounds normal. No respiratory distress. She has no wheezes. She has no rales.  Abdominal: Soft. Bowel sounds are  normal. She exhibits no distension. There is no tenderness. There is no rebound and no guarding.  Lymphadenopathy:    She has no cervical adenopathy.  Vitals reviewed.         Assessment & Plan:  Benign essential HTN - Plan: CBC with Differential/Platelet, COMPLETE METABOLIC PANEL WITH GFR, Lipid panel  Hypothyroidism, unspecified type - Plan: TSH  CKD (chronic kidney disease) stage 3, GFR 30-59 ml/min (HCC)  Blood pressure is outstanding at 110/70.  Chronic kidney disease is stable.  Continue to encourage avoiding NSAIDs or nephrotoxic agents and ensuring that she drinks plenty of water.  Immunizations are up-to-date except for the tetanus vaccine.  I will schedule the patient for Cologuard.  Hypothyroidism is adequately treated based on the normal TSH.  Regular anticipatory guidance is provided.  I believe she has Achilles tendinitis and likely some bursitis in her heel.  I recommended stretches to the Achilles tendon to loosen a tight heel cord to try to improve her situation.

## 2018-01-11 ENCOUNTER — Other Ambulatory Visit: Payer: Self-pay | Admitting: Family Medicine

## 2018-01-14 DIAGNOSIS — Z1212 Encounter for screening for malignant neoplasm of rectum: Secondary | ICD-10-CM | POA: Diagnosis not present

## 2018-01-14 DIAGNOSIS — Z1211 Encounter for screening for malignant neoplasm of colon: Secondary | ICD-10-CM | POA: Diagnosis not present

## 2018-01-19 LAB — COLOGUARD

## 2018-02-04 ENCOUNTER — Encounter: Payer: Self-pay | Admitting: Family Medicine

## 2018-02-18 ENCOUNTER — Other Ambulatory Visit: Payer: Self-pay | Admitting: Family Medicine

## 2018-02-18 DIAGNOSIS — Z1231 Encounter for screening mammogram for malignant neoplasm of breast: Secondary | ICD-10-CM

## 2018-03-16 ENCOUNTER — Ambulatory Visit
Admission: RE | Admit: 2018-03-16 | Discharge: 2018-03-16 | Disposition: A | Discharge status: Home / Self Care | Payer: Medicare HMO | Source: Ambulatory Visit | Attending: Family Medicine | Admitting: Family Medicine

## 2018-03-16 DIAGNOSIS — Z1231 Encounter for screening mammogram for malignant neoplasm of breast: Secondary | ICD-10-CM | POA: Diagnosis not present

## 2018-04-05 ENCOUNTER — Other Ambulatory Visit: Payer: Self-pay | Admitting: Family Medicine

## 2018-04-06 ENCOUNTER — Ambulatory Visit (INDEPENDENT_AMBULATORY_CARE_PROVIDER_SITE_OTHER): Payer: Medicare HMO | Admitting: Family Medicine

## 2018-04-06 ENCOUNTER — Ambulatory Visit
Admission: RE | Admit: 2018-04-06 | Discharge: 2018-04-06 | Disposition: A | Discharge status: Home / Self Care | Payer: Medicare HMO | Source: Ambulatory Visit | Attending: Family Medicine | Admitting: Family Medicine

## 2018-04-06 ENCOUNTER — Encounter: Payer: Self-pay | Admitting: Family Medicine

## 2018-04-06 VITALS — BP 144/82 | HR 72 | Temp 98.0°F | Resp 16 | Ht 63.5 in | Wt 196.0 lb

## 2018-04-06 DIAGNOSIS — M79671 Pain in right foot: Secondary | ICD-10-CM

## 2018-04-06 DIAGNOSIS — M7731 Calcaneal spur, right foot: Secondary | ICD-10-CM | POA: Diagnosis not present

## 2018-04-06 NOTE — Progress Notes (Signed)
Subjective:    [name], female    DOB: 1946/05/21, 72 y.o.   MRN: [id number]  HPI Patient was seen in July.  At that time she had some pain near the insertion of Achilles tendon on the right heel and I believe that the patient had Achilles tendinitis and I recommended stretches to treat Achilles tendinitis.  She states that the pain has worsened dramatically over the last 3 months.  She is now exquisitely tender to palpation over the lateral aspect of the calcaneus, the plantar aspect of the calcaneus, as well as the posterior aspect of the calcaneus near the insertion point for the Achilles.  It hurts to touch on the backside of her heel.  She jumps when I squeeze the sides of her heel.  She jumps when I palpate the bottom of her heel.  She can barely put weight on her heel.  Pain is out of proportion to the as above findings on exam.  There is no visible swelling or bruising or erythema Past Medical History:  Diagnosis Date  . CKD (chronic kidney disease) stage 3, GFR 30-59 ml/min (HCC)   . Hypertension   . Thyroid disease    hypothyroid   Past Surgical History:  Procedure Laterality Date  . ABDOMINAL HYSTERECTOMY    . CATARACT EXTRACTION    . KNEE SURGERY Left    11 surgery   . SHOULDER SURGERY Bilateral    Current Outpatient Medications on File Prior to Visit  Medication Sig Dispense Refill  . aspirin 81 MG tablet Take 81 mg by mouth daily.    Marland Kitchen BIOTIN PO Take 1 tablet by mouth daily.    Marland Kitchen docusate sodium (COLACE) 100 MG capsule Take 200 mg by mouth at bedtime.     . fexofenadine (ALLEGRA) 180 MG tablet Take 1 tablet (180 mg total) by mouth daily. 90 tablet 4  . Ginkgo Biloba 40 MG TABS Take 1 tablet by mouth daily.     . hydrochlorothiazide (HYDRODIURIL) 25 MG tablet Take 1 tablet (25 mg total) by mouth daily. 90 tablet 3  . levothyroxine (SYNTHROID, LEVOTHROID) 112 MCG tablet TAKE 1 TABLET BY MOUTH EVERY DAY 90 tablet 2  . losartan (COZAAR) 100 MG tablet  TAKE 1 TABLET BY MOUTH EVERY DAY 90 tablet 3  . Multiple Vitamins-Minerals (AIRBORNE PO) Take 2 tablets by mouth daily.     No current facility-administered medications on file prior to visit.    Allergies  Allergen Reactions  . Erythromycin Base Other (See Comments)  . Penicillins   . Tylenol [Acetaminophen]   . Cortisone Other (See Comments) and Rash    given injections-skin peels/redness   Social History   Socioeconomic History  . Marital status: Married    Spouse name: Not on file  . Number of children: Not on file  . Years of education: Not on file  . Highest education level: Not on file  Occupational History  . Not on file  Social Needs  . Financial resource strain: Not on file  . Food insecurity:    Worry: Not on file    Inability: Not on file  . Transportation needs:    Medical: Not on file    Non-medical: Not on file  Tobacco Use  . Smoking status: Never Smoker  . Smokeless tobacco: Never Used  Substance and Sexual Activity  . Alcohol use: Not on file  . Drug use: Not on file  . Sexual activity: Not on  file  Lifestyle  . Physical activity:    Days per week: Not on file    Minutes per session: Not on file  . Stress: Not on file  Relationships  . Social connections:    Talks on phone: Not on file    Gets together: Not on file    Attends religious service: Not on file    Active member of club or organization: Not on file    Attends meetings of clubs or organizations: Not on file    Relationship status: Not on file  . Intimate partner violence:    Fear of current or ex partner: Not on file    Emotionally abused: Not on file    Physically abused: Not on file    Forced sexual activity: Not on file  Other Topics Concern  . Not on file  Social History Narrative  . Not on file      Review of Systems  All other systems reviewed and are negative.      Objective:   Physical Exam  Cardiovascular: Normal rate and regular rhythm.  Pulmonary/Chest:  Effort normal and breath sounds normal.  Musculoskeletal:       Right foot: There is decreased range of motion, tenderness and bony tenderness. There is no swelling, no crepitus, no deformity and no laceration.       Feet:  Vitals reviewed.         Assessment & Plan:  Intractable right heel pain - Plan: DG Foot Complete Right  Patient's exam suggest possibly a stress fracture in her heel.  Begin by obtaining an x-ray of the right foot.  If x-ray is negative for any fracture, I would recommend a bone scan to evaluate further.  Would likely recommend treatment for a stress fracture with a cam walker and touchdown weightbearing only for 2 weeks and then gradually advance weightbearing as tolerated.  Await the results of the x-ray before determining further plan

## 2018-04-09 ENCOUNTER — Encounter: Payer: Self-pay | Admitting: Family Medicine

## 2018-04-09 DIAGNOSIS — M79671 Pain in right foot: Secondary | ICD-10-CM

## 2018-04-14 ENCOUNTER — Encounter: Payer: Self-pay | Admitting: Podiatry

## 2018-04-14 ENCOUNTER — Ambulatory Visit: Payer: Medicare HMO | Admitting: Podiatry

## 2018-04-14 ENCOUNTER — Ambulatory Visit: Payer: Medicare HMO

## 2018-04-14 VITALS — BP 137/75 | HR 69

## 2018-04-14 DIAGNOSIS — M722 Plantar fascial fibromatosis: Secondary | ICD-10-CM | POA: Diagnosis not present

## 2018-04-14 DIAGNOSIS — M79671 Pain in right foot: Secondary | ICD-10-CM

## 2018-04-14 DIAGNOSIS — R69 Illness, unspecified: Secondary | ICD-10-CM | POA: Diagnosis not present

## 2018-04-14 MED ORDER — DICLOFENAC SODIUM 75 MG PO TBEC: 75.0000 mg | DELAYED_RELEASE_TABLET | Freq: Two times a day (BID) | ORAL | 2 refills | Status: DC

## 2018-04-14 NOTE — Progress Notes (Signed)
Subjective:   [name], female   DOB: 72 y.o.   MRN: [id number]   HPI Patient states she is had a lot of pain in the bottom of her right heel for 9 to 10 months and states is worse when she gets up in the morning after periods of sitting.  Patient states that she cannot have injectable steroid as she is had reactions to it and the pain is quite intense with ambulation patient does not smoke and likes to be active   Review of Systems  All other systems reviewed and are negative.       Objective:  Physical Exam  Constitutional: She appears well-developed and well-nourished.  Cardiovascular: Intact distal pulses.  Pulmonary/Chest: Effort normal.  Musculoskeletal: Normal range of motion.  Neurological: She is alert.  Skin: Skin is warm.  Nursing note and vitals reviewed.   Neurovascular status was found to be intact muscle strength was adequate range of motion within normal limits with patient found to have acute discomfort plantar aspect right heel inflammation with x-rays she brought showing large spur formation.  Patient was found to have good digital perfusion well oriented x3     Assessment:  Acute fasciitis plantar aspect right heel with inflammation fluid in the medial band     Plan:  H&P conditions reviewed and today I did go ahead and applied an air fracture walker to completely immobilize along with aggressive ice therapy and placed on diclofenac 75 mg twice daily.  Reappoint for [place] to recheck in 3 weeks and may require other treatments depending on response

## 2018-04-14 NOTE — Patient Instructions (Signed)

## 2018-04-20 DIAGNOSIS — Z96652 Presence of left artificial knee joint: Secondary | ICD-10-CM | POA: Diagnosis not present

## 2018-04-29 ENCOUNTER — Other Ambulatory Visit: Payer: Self-pay | Admitting: Family Medicine

## 2018-05-05 ENCOUNTER — Encounter: Payer: Self-pay | Admitting: Podiatry

## 2018-05-05 ENCOUNTER — Ambulatory Visit (INDEPENDENT_AMBULATORY_CARE_PROVIDER_SITE_OTHER): Payer: Medicare HMO | Admitting: Podiatry

## 2018-05-05 DIAGNOSIS — M7661 Achilles tendinitis, right leg: Secondary | ICD-10-CM | POA: Diagnosis not present

## 2018-05-05 DIAGNOSIS — T847XXA Infection and inflammatory reaction due to other internal orthopedic prosthetic devices, implants and grafts, initial encounter: Secondary | ICD-10-CM | POA: Insufficient documentation

## 2018-05-05 DIAGNOSIS — M722 Plantar fascial fibromatosis: Secondary | ICD-10-CM | POA: Diagnosis not present

## 2018-05-05 NOTE — Patient Instructions (Signed)

## 2018-05-09 NOTE — Progress Notes (Signed)
Subjective:   [name], female   DOB: 72 y.o.   MRN: [id number]   HPI Patient presents stating I have had some improvement but continued to have discomfort in the boot is difficult for me to wear at all times.  Patient states the pain is worse at night when she is in bed   ROS      Objective:  Physical Exam  Neurovascular status intact with patient's pain improving in the plantar heel with moderate pain in the Achilles tendon which may be compensatory or inflammatory     Assessment:  Plantar fasciitis which is improving but is still present with Achilles tendinitis which may be compensatory     Plan:  H&P reviewed conditions and at this point advised on gradual reduction in immobilization when walking and I did apply a night splint with all instructions on usage.  Continue heat ice therapy and reappoint 4 weeks

## 2018-06-09 ENCOUNTER — Ambulatory Visit (INDEPENDENT_AMBULATORY_CARE_PROVIDER_SITE_OTHER): Payer: Medicare HMO | Admitting: Podiatry

## 2018-06-09 ENCOUNTER — Encounter: Payer: Self-pay | Admitting: Podiatry

## 2018-06-09 DIAGNOSIS — M79676 Pain in unspecified toe(s): Secondary | ICD-10-CM

## 2018-06-09 DIAGNOSIS — M722 Plantar fascial fibromatosis: Secondary | ICD-10-CM | POA: Diagnosis not present

## 2018-06-09 DIAGNOSIS — M7661 Achilles tendinitis, right leg: Secondary | ICD-10-CM

## 2018-06-09 NOTE — Progress Notes (Signed)
Subjective:   [name], female   DOB: 72 y.o.   MRN: [id number]   HPI Patient states the bottom of her heel continues to hurt and while the boot made a difference it still is very sore and she is getting ready to go away in January.  States she has trouble doing a lot of ambulation and is tried shoes with heels   ROS      Objective:  Physical Exam  Neurovascular status intact with discomfort in the plantar aspect right moderate nature with inflammation fluid of the center of the heel itself     Assessment:  Plantar fasciitis right central heel and moderate on the medial side of the heel     Plan:  H&P condition reviewed and recommended continuation of conservative care but due to the intensity of discomfort I spent a great deal time going over shockwave or possible surgical intervention.  We are going to go shockwave and we started with shockwave therapy today and she will have 3 visits before she leaves to go out of town and then will be seen back when she returns in March.  Patient tolerated procedure well

## 2018-06-16 ENCOUNTER — Ambulatory Visit: Payer: Self-pay

## 2018-06-16 DIAGNOSIS — M722 Plantar fascial fibromatosis: Secondary | ICD-10-CM

## 2018-06-16 DIAGNOSIS — M79676 Pain in unspecified toe(s): Secondary | ICD-10-CM

## 2018-06-16 NOTE — Progress Notes (Signed)
Patient is here today with complaint of right heel pain.  She states this is been ongoing for several months.  She has tried various treatments with no success and pain relief.  Recently diagnosed with plantar fasciitis right foot  Pain on palpation  central heel right foot and moderate amount of pain on the medial side of the right heel.  ESWT administered for 6 J and tolerated well.  Epat was administered to surrounding connective tissues.  I advised her against using NSAIDs and ice, and to utilize her boot or supportive shoe while undergoing treatment.  She is to follow-up next week for third treatment, after her third treatment she is to follow-up in 2 weeks for her fourth treatment.

## 2018-06-21 ENCOUNTER — Other Ambulatory Visit: Payer: Medicare HMO

## 2018-06-21 DIAGNOSIS — E7800 Pure hypercholesterolemia, unspecified: Secondary | ICD-10-CM

## 2018-06-21 DIAGNOSIS — Z79899 Other long term (current) drug therapy: Secondary | ICD-10-CM

## 2018-06-21 DIAGNOSIS — E039 Hypothyroidism, unspecified: Secondary | ICD-10-CM

## 2018-06-21 DIAGNOSIS — I10 Essential (primary) hypertension: Secondary | ICD-10-CM | POA: Diagnosis not present

## 2018-06-22 LAB — CBC WITH DIFFERENTIAL/PLATELET
Absolute Monocytes: 475 cells/uL (ref 200–950)
BASOS PCT: 0.8 %
Basophils Absolute: 58 cells/uL (ref 0–200)
Eosinophils Absolute: 336 cells/uL (ref 15–500)
Eosinophils Relative: 4.6 %
HCT: 44 % (ref 35.0–45.0)
HEMOGLOBIN: 14.8 g/dL (ref 11.7–15.5)
LYMPHS ABS: 3081 {cells}/uL (ref 850–3900)
MCH: 31.4 pg (ref 27.0–33.0)
MCHC: 33.6 g/dL (ref 32.0–36.0)
MCV: 93.4 fL (ref 80.0–100.0)
MPV: 10.1 fL (ref 7.5–12.5)
Monocytes Relative: 6.5 %
NEUTROS ABS: 3351 {cells}/uL (ref 1500–7800)
Neutrophils Relative %: 45.9 %
PLATELETS: 290 10*3/uL (ref 140–400)
RBC: 4.71 10*6/uL (ref 3.80–5.10)
RDW: 11.7 % (ref 11.0–15.0)
TOTAL LYMPHOCYTE: 42.2 %
WBC: 7.3 10*3/uL (ref 3.8–10.8)

## 2018-06-22 LAB — COMPREHENSIVE METABOLIC PANEL
AG Ratio: 1.4 (calc) (ref 1.0–2.5)
ALBUMIN MSPROF: 3.9 g/dL (ref 3.6–5.1)
ALKALINE PHOSPHATASE (APISO): 56 U/L (ref 33–130)
ALT: 17 U/L (ref 6–29)
AST: 19 U/L (ref 10–35)
BILIRUBIN TOTAL: 0.4 mg/dL (ref 0.2–1.2)
BUN/Creatinine Ratio: 14 (calc) (ref 6–22)
BUN: 15 mg/dL (ref 7–25)
CALCIUM: 9.6 mg/dL (ref 8.6–10.4)
CO2: 27 mmol/L (ref 20–32)
CREATININE: 1.07 mg/dL — AB (ref 0.60–0.93)
Chloride: 105 mmol/L (ref 98–110)
Globulin: 2.8 g/dL (calc) (ref 1.9–3.7)
Glucose, Bld: 104 mg/dL — ABNORMAL HIGH (ref 65–99)
Potassium: 4.8 mmol/L (ref 3.5–5.3)
Sodium: 142 mmol/L (ref 135–146)
Total Protein: 6.7 g/dL (ref 6.1–8.1)

## 2018-06-22 LAB — LIPID PANEL
CHOL/HDL RATIO: 3.4 (calc) (ref <5.0)
CHOLESTEROL: 169 mg/dL (ref <200)
HDL: 49 mg/dL — AB (ref >50)
LDL CHOLESTEROL (CALC): 99 mg/dL
Non-HDL Cholesterol (Calc): 120 mg/dL (calc) (ref <130)
TRIGLYCERIDES: 111 mg/dL (ref <150)

## 2018-06-22 LAB — TSH: TSH: 1.11 mIU/L (ref 0.40–4.50)

## 2018-06-24 ENCOUNTER — Other Ambulatory Visit: Payer: Medicare HMO

## 2018-06-25 ENCOUNTER — Ambulatory Visit: Payer: Self-pay | Admitting: Podiatry

## 2018-06-25 DIAGNOSIS — M7661 Achilles tendinitis, right leg: Secondary | ICD-10-CM

## 2018-06-25 DIAGNOSIS — M79676 Pain in unspecified toe(s): Secondary | ICD-10-CM

## 2018-06-25 DIAGNOSIS — M779 Enthesopathy, unspecified: Secondary | ICD-10-CM | POA: Diagnosis not present

## 2018-06-25 DIAGNOSIS — M722 Plantar fascial fibromatosis: Secondary | ICD-10-CM

## 2018-06-25 NOTE — Progress Notes (Addendum)
Patient is here today with complaint of right heel pain.  She states this is been ongoing for several months.  She has tried various treatments with no success and pain relief.  Recently diagnosed with plantar fasciitis right foot.  She states that she is making progress with the heels with the treatment however over the last 2 years she has some intermittent pain to the top of the foot and she was have the area checked but she is here today.  She denies any recent injury or trauma.  She had no recent treatment.  Pain on palpation central heel right foot and minimal amount of pain on the medial side of the right heel.  Mild discomfort along the dorsal aspect of the midfoot along the Lisfranc joint more along the second metatarsal cuneiform joint.  No significant edema to this area there is a small palpable bone spur present.  Extensor, flexor tendons all appear to be intact.  There is no pain with calf compression, swelling, warmth, erythema.  ESWT administered for 6 J and tolerated well.  Epat was administered to surrounding connective tissues.  I advised her against using oral NSAIDs and ice, and to utilize her boot or supportive shoe while undergoing treatment.    In regard to the pain on the dorsal aspect of the foot I prescribed Voltaren gel.  She has this at home already.  Ice to the top of the foot but I want her to hold off on icing or anti-inflammatory gel to the plantar heel.  She is to follow-up in 2 weeks for her fourth treatment, but patient states [place] is going to [place] until [place] and will return for her last treatment at that time.  Vivi BarrackMatthew R Wagoner DPM

## 2018-06-28 ENCOUNTER — Ambulatory Visit: Payer: Medicare HMO | Admitting: Family Medicine

## 2018-06-29 ENCOUNTER — Encounter: Payer: Self-pay | Admitting: Family Medicine

## 2018-06-29 ENCOUNTER — Ambulatory Visit (INDEPENDENT_AMBULATORY_CARE_PROVIDER_SITE_OTHER): Payer: Medicare HMO | Admitting: Family Medicine

## 2018-06-29 VITALS — BP 132/70 | HR 72 | Temp 97.7°F | Resp 16 | Ht 63.5 in | Wt 198.0 lb

## 2018-06-29 DIAGNOSIS — N183 Chronic kidney disease, stage 3 unspecified: Secondary | ICD-10-CM

## 2018-06-29 DIAGNOSIS — E039 Hypothyroidism, unspecified: Secondary | ICD-10-CM | POA: Diagnosis not present

## 2018-06-29 DIAGNOSIS — I10 Essential (primary) hypertension: Secondary | ICD-10-CM | POA: Diagnosis not present

## 2018-06-29 NOTE — Progress Notes (Signed)
Subjective:    [name], female    DOB: 26-Jul-1945, 72 y.o.   MRN: [id number]  Medication Refill    12/2017 Patient is here for follow up. Has hypothyroidism, mild CKD, and essential hypertension.  She is currently taking levothyroxine 100-112 g alternating them on a daily basis. She denies any chest pain shortness of breath or dyspnea on exertion. Her flu shot is up-to-date. She is still overdue for colonoscopy.  She refuses a colonoscopy due to bowel prep however she would be willing to consider Cologuard.  She also reports some pain at the insertion of her Achilles tendon on her left heel.  It is tender to palpation there at night.  She denies any falls or injuries.   At that time, my plan was: Blood pressure is outstanding at 110/70.  Chronic kidney disease is stable.  Continue to encourage avoiding NSAIDs or nephrotoxic agents and ensuring that she drinks plenty of water.  Immunizations are up-to-date except for the tetanus vaccine.  I will schedule the patient for Cologuard.  Hypothyroidism is adequately treated based on the normal TSH.  Regular anticipatory guidance is provided.  I believe she has Achilles tendinitis and likely some bursitis in her heel.  I recommended stretches to the Achilles tendon to loosen a tight heel cord to try to improve her situation.  06/29/18 Here today for follow up of her chronic medical problems.  Patient has a history of hypertension, chronic kidney disease stage III, and hypothyroidism.  She has been having a difficult time with her right foot.  She has plantar fasciitis along with bone spurs on the right heel and on the dorsum of the right foot.  She has been receiving shock therapy under the care of a podiatrist and the pain has improved but because of this she has been more sedentary.  This perhaps explains the drop in her HDL as well as the rise in her fasting blood sugar.  Otherwise she is doing well.  She denies any chest pain  shortness of breath or dyspnea on exertion most recent labs are listed below: Appointment on 06/21/2018  Component Date Value Ref Range Status  . WBC 06/21/2018 7.3  3.8 - 10.8 Thousand/uL Final  . RBC 06/21/2018 4.71  3.80 - 5.10 Million/uL Final  . Hemoglobin 06/21/2018 14.8  11.7 - 15.5 g/dL Final  . HCT 40/98/1191 44.0  35.0 - 45.0 % Final  . MCV 06/21/2018 93.4  80.0 - 100.0 fL Final  . MCH 06/21/2018 31.4  27.0 - 33.0 pg Final  . MCHC 06/21/2018 33.6  32.0 - 36.0 g/dL Final  . RDW 47/82/9562 11.7  11.0 - 15.0 % Final  . Platelets 06/21/2018 290  140 - 400 Thousand/uL Final  . MPV 06/21/2018 10.1  7.5 - 12.5 fL Final  . Neutro Abs 06/21/2018 3,351  1,500 - 7,800 cells/uL Final  . Lymphs Abs 06/21/2018 3,081  850 - 3,900 cells/uL Final  . Absolute Monocytes 06/21/2018 475  200 - 950 cells/uL Final  . Eosinophils Absolute 06/21/2018 336  15 - 500 cells/uL Final  . Basophils Absolute 06/21/2018 58  0 - 200 cells/uL Final  . Neutrophils Relative % 06/21/2018 45.9  % Final  . Total Lymphocyte 06/21/2018 42.2  % Final  . Monocytes Relative 06/21/2018 6.5  % Final  . Eosinophils Relative 06/21/2018 4.6  % Final  . Basophils Relative 06/21/2018 0.8  % Final  . Glucose, Bld 06/21/2018 104* 65 - 99 mg/dL  Final   Comment: .            Fasting reference interval . For someone without known diabetes, a glucose value between 100 and 125 mg/dL is consistent with prediabetes and should be confirmed with a follow-up test. .   . BUN 06/21/2018 15  7 - 25 mg/dL Final  . Creat 16/03/9603 1.07* 0.60 - 0.93 mg/dL Final   Comment: For patients >35 years of age, the reference limit for Creatinine is approximately 13% higher for people identified as African-American. .   [name] Ratio 06/21/2018 14  6 - 22 (calc) Final  . Sodium 06/21/2018 142  135 - 146 mmol/L Final  . Potassium 06/21/2018 4.8  3.5 - 5.3 mmol/L Final  . Chloride 06/21/2018 105  98 - 110 mmol/L Final  . CO2  06/21/2018 27  20 - 32 mmol/L Final  . Calcium 06/21/2018 9.6  8.6 - 10.4 mg/dL Final  . Total Protein 06/21/2018 6.7  6.1 - 8.1 g/dL Final  . Albumin 54/02/8118 3.9  3.6 - 5.1 g/dL Final  . Globulin 14/78/2956 2.8  1.9 - 3.7 g/dL (calc) Final  . AG Ratio 06/21/2018 1.4  1.0 - 2.5 (calc) Final  . Total Bilirubin 06/21/2018 0.4  0.2 - 1.2 mg/dL Final  . Alkaline phosphatase (APISO) 06/21/2018 56  33 - 130 U/L Final  . AST 06/21/2018 19  10 - 35 U/L Final  . ALT 06/21/2018 17  6 - 29 U/L Final  . Cholesterol 06/21/2018 169  <200 mg/dL Final  . HDL 21/30/8657 49* >50 mg/dL Final  . Triglycerides 06/21/2018 111  <150 mg/dL Final  . LDL Cholesterol (Calc) 06/21/2018 99  mg/dL (calc) Final   Comment: Reference range: <100 . Desirable range <100 mg/dL for primary prevention;   <70 mg/dL for patients with CHD or diabetic patients  with > or = 2 CHD risk factors. Marland Kitchen LDL-C is now calculated using the Martin-Hopkins  calculation, which is a validated novel method providing  better accuracy than the Friedewald equation in the  estimation of LDL-C.  Horald Pollen et al. [place] Ahr. 8469;629(52): 2061-2068  (http://education.[facility].com/faq/FAQ164)   . Total CHOL/HDL Ratio 06/21/2018 3.4  <8.4 (calc) Final  . Non-HDL Cholesterol (Calc) 06/21/2018 120  <130 mg/dL (calc) Final   Comment: For patients with diabetes plus 1 major ASCVD risk  factor, treating to a non-HDL-C goal of <100 mg/dL  (LDL-C of <13 mg/dL) is considered a therapeutic  option.   Marland Kitchen TSH 06/21/2018 1.11  0.40 - 4.50 mIU/L Final     Past Medical History:  Diagnosis Date  . CKD (chronic kidney disease) stage 3, GFR 30-59 ml/min (HCC)   . Hypertension   . Thyroid disease    hypothyroid   Past Surgical History:  Procedure Laterality Date  . ABDOMINAL HYSTERECTOMY    . CATARACT EXTRACTION    . KNEE SURGERY Left    11 surgery   . SHOULDER SURGERY Bilateral    Current Outpatient Medications on File Prior to Visit    Medication Sig Dispense Refill  . aspirin 81 MG tablet Take 81 mg by mouth daily.    Marland Kitchen BIOTIN PO Take 1 tablet by mouth daily.    . diclofenac (VOLTAREN) 75 MG EC tablet Take 1 tablet (75 mg total) by mouth 2 (two) times daily. 50 tablet 2  . docusate sodium (COLACE) 100 MG capsule Take 200 mg by mouth at bedtime.     . fexofenadine (ALLEGRA) 180 MG tablet Take  1 tablet (180 mg total) by mouth daily. 90 tablet 4  . Ginkgo Biloba 40 MG TABS Take 1 tablet by mouth daily.     . hydrochlorothiazide (HYDRODIURIL) 25 MG tablet Take 1 tablet (25 mg total) by mouth daily. 90 tablet 3  . levothyroxine (SYNTHROID, LEVOTHROID) 112 MCG tablet TAKE 1 TABLET BY MOUTH EVERY DAY 90 tablet 1  . losartan (COZAAR) 100 MG tablet TAKE 1 TABLET BY MOUTH EVERY DAY 90 tablet 3  . Multiple Vitamins-Minerals (AIRBORNE PO) Take 2 tablets by mouth daily.     No current facility-administered medications on file prior to visit.    Allergies  Allergen Reactions  . Diclofenac Anaphylaxis    Pt stated, "upset my stomach and gave me diarrhea"  . Erythromycin Base Other (See Comments)  . Penicillins   . Tylenol [Acetaminophen]   . Cortisone Other (See Comments) and Rash    given injections-skin peels/redness   Social History   Socioeconomic History  . Marital status: Married    Spouse name: Not on file  . Number of children: Not on file  . Years of education: Not on file  . Highest education level: Not on file  Occupational History  . Not on file  Social Needs  . Financial resource strain: Not on file  . Food insecurity:    Worry: Not on file    Inability: Not on file  . Transportation needs:    Medical: Not on file    Non-medical: Not on file  Tobacco Use  . Smoking status: Never Smoker  . Smokeless tobacco: Never Used  Substance and Sexual Activity  . Alcohol use: Not on file  . Drug use: Not on file  . Sexual activity: Not on file  Lifestyle  . Physical activity:    Days per week: Not on file     Minutes per session: Not on file  . Stress: Not on file  Relationships  . Social connections:    Talks on phone: Not on file    Gets together: Not on file    Attends religious service: Not on file    Active member of club or organization: Not on file    Attends meetings of clubs or organizations: Not on file    Relationship status: Not on file  . Intimate partner violence:    Fear of current or ex partner: Not on file    Emotionally abused: Not on file    Physically abused: Not on file    Forced sexual activity: Not on file  Other Topics Concern  . Not on file  Social History Narrative  . Not on file      Review of Systems  All other systems reviewed and are negative.      Objective:   Physical Exam  Constitutional: She appears well-developed and well-nourished.  HENT:  Nose: Nose normal.  Mouth/Throat: Oropharynx is clear and moist.  Neck: Neck supple. No thyromegaly present.  Cardiovascular: Normal rate, regular rhythm and normal heart sounds.  Pulmonary/Chest: Effort normal and breath sounds normal. No respiratory distress. She has no wheezes. She has no rales.  Abdominal: Soft. Bowel sounds are normal. She exhibits no distension. There is no abdominal tenderness. There is no rebound and no guarding.  Lymphadenopathy:    She has no cervical adenopathy.  Vitals reviewed.         Assessment & Plan:  Benign essential HTN  Hypothyroidism, unspecified type  CKD (chronic kidney disease) stage 3, GFR  30-59 ml/min (HCC)  I am very happy with her blood pressure.  Her kidney function remains stable.  Her thyroid is appropriately treated.  I have encouraged increasing aerobic exercise as tolerable with the pain in her feet.  I believe this would help drop her fasting blood sugar and raise her HDL cholesterol.  Otherwise I will make no changes in her medication and recheck in 6 months

## 2018-09-01 ENCOUNTER — Other Ambulatory Visit: Payer: Medicare HMO

## 2018-11-03 ENCOUNTER — Other Ambulatory Visit: Payer: Self-pay | Admitting: Family Medicine

## 2018-11-03 MED ORDER — LEVOTHYROXINE SODIUM 112 MCG PO TABS: 112.0000 ug | ORAL_TABLET | Freq: Every day | ORAL | 1 refills | Status: DC

## 2018-11-11 DIAGNOSIS — M1990 Unspecified osteoarthritis, unspecified site: Secondary | ICD-10-CM | POA: Diagnosis not present

## 2018-11-11 DIAGNOSIS — H52203 Unspecified astigmatism, bilateral: Secondary | ICD-10-CM | POA: Diagnosis not present

## 2018-11-11 DIAGNOSIS — Z8249 Family history of ischemic heart disease and other diseases of the circulatory system: Secondary | ICD-10-CM | POA: Diagnosis not present

## 2018-11-11 DIAGNOSIS — H524 Presbyopia: Secondary | ICD-10-CM | POA: Diagnosis not present

## 2018-11-11 DIAGNOSIS — I10 Essential (primary) hypertension: Secondary | ICD-10-CM | POA: Diagnosis not present

## 2018-11-11 DIAGNOSIS — R32 Unspecified urinary incontinence: Secondary | ICD-10-CM | POA: Diagnosis not present

## 2018-11-11 DIAGNOSIS — Z961 Presence of intraocular lens: Secondary | ICD-10-CM | POA: Diagnosis not present

## 2018-11-11 DIAGNOSIS — K5900 Constipation, unspecified: Secondary | ICD-10-CM | POA: Diagnosis not present

## 2018-11-11 DIAGNOSIS — Z7982 Long term (current) use of aspirin: Secondary | ICD-10-CM | POA: Diagnosis not present

## 2018-11-11 DIAGNOSIS — J309 Allergic rhinitis, unspecified: Secondary | ICD-10-CM | POA: Diagnosis not present

## 2018-11-11 DIAGNOSIS — E039 Hypothyroidism, unspecified: Secondary | ICD-10-CM | POA: Diagnosis not present

## 2018-11-11 DIAGNOSIS — H26491 Other secondary cataract, right eye: Secondary | ICD-10-CM | POA: Diagnosis not present

## 2018-12-01 ENCOUNTER — Other Ambulatory Visit: Payer: Self-pay | Admitting: Family Medicine

## 2018-12-01 DIAGNOSIS — I10 Essential (primary) hypertension: Secondary | ICD-10-CM

## 2018-12-09 ENCOUNTER — Ambulatory Visit: Payer: Medicare HMO | Admitting: Family Medicine

## 2018-12-13 ENCOUNTER — Other Ambulatory Visit: Payer: Self-pay

## 2018-12-13 ENCOUNTER — Ambulatory Visit: Payer: Medicare HMO | Admitting: Family Medicine

## 2018-12-13 ENCOUNTER — Other Ambulatory Visit: Payer: Medicare HMO

## 2018-12-13 DIAGNOSIS — Z79899 Other long term (current) drug therapy: Secondary | ICD-10-CM | POA: Diagnosis not present

## 2018-12-13 DIAGNOSIS — E039 Hypothyroidism, unspecified: Secondary | ICD-10-CM | POA: Diagnosis not present

## 2018-12-13 DIAGNOSIS — E7800 Pure hypercholesterolemia, unspecified: Secondary | ICD-10-CM | POA: Diagnosis not present

## 2018-12-13 DIAGNOSIS — I10 Essential (primary) hypertension: Secondary | ICD-10-CM | POA: Diagnosis not present

## 2018-12-14 LAB — COMPREHENSIVE METABOLIC PANEL
AG Ratio: 1.5 (calc) (ref 1.0–2.5)
ALT: 15 U/L (ref 6–29)
AST: 22 U/L (ref 10–35)
Albumin: 3.6 g/dL (ref 3.6–5.1)
Alkaline phosphatase (APISO): 54 U/L (ref 37–153)
BUN/Creatinine Ratio: 20 (calc) (ref 6–22)
BUN: 22 mg/dL (ref 7–25)
CO2: 25 mmol/L (ref 20–32)
Calcium: 9.5 mg/dL (ref 8.6–10.4)
Chloride: 103 mmol/L (ref 98–110)
Creat: 1.08 mg/dL — ABNORMAL HIGH (ref 0.60–0.93)
Globulin: 2.4 g/dL (calc) (ref 1.9–3.7)
Glucose, Bld: 108 mg/dL — ABNORMAL HIGH (ref 65–99)
Potassium: 4.5 mmol/L (ref 3.5–5.3)
Sodium: 138 mmol/L (ref 135–146)
Total Bilirubin: 0.7 mg/dL (ref 0.2–1.2)
Total Protein: 6 g/dL — ABNORMAL LOW (ref 6.1–8.1)

## 2018-12-14 LAB — CBC WITH DIFFERENTIAL/PLATELET
Absolute Monocytes: 547 cells/uL (ref 200–950)
Basophils Absolute: 58 cells/uL (ref 0–200)
Basophils Relative: 0.8 %
Eosinophils Absolute: 302 cells/uL (ref 15–500)
Eosinophils Relative: 4.2 %
HCT: 40.6 % (ref 35.0–45.0)
Hemoglobin: 13.5 g/dL (ref 11.7–15.5)
Lymphs Abs: 3002 cells/uL (ref 850–3900)
MCH: 30.9 pg (ref 27.0–33.0)
MCHC: 33.3 g/dL (ref 32.0–36.0)
MCV: 92.9 fL (ref 80.0–100.0)
MPV: 10.3 fL (ref 7.5–12.5)
Monocytes Relative: 7.6 %
Neutro Abs: 3290 cells/uL (ref 1500–7800)
Neutrophils Relative %: 45.7 %
Platelets: 252 10*3/uL (ref 140–400)
RBC: 4.37 10*6/uL (ref 3.80–5.10)
RDW: 11.8 % (ref 11.0–15.0)
Total Lymphocyte: 41.7 %
WBC: 7.2 10*3/uL (ref 3.8–10.8)

## 2018-12-14 LAB — LIPID PANEL
Cholesterol: 169 mg/dL (ref <200)
HDL: 50 mg/dL (ref >=50)
LDL Cholesterol (Calc): 100 mg/dL (calc) — ABNORMAL HIGH
Non-HDL Cholesterol (Calc): 119 mg/dL (calc) (ref <130)
Total CHOL/HDL Ratio: 3.4 (calc) (ref <5.0)
Triglycerides: 97 mg/dL (ref <150)

## 2018-12-20 ENCOUNTER — Other Ambulatory Visit: Payer: Self-pay

## 2018-12-20 ENCOUNTER — Ambulatory Visit (INDEPENDENT_AMBULATORY_CARE_PROVIDER_SITE_OTHER): Payer: Medicare HMO | Admitting: Family Medicine

## 2018-12-20 VITALS — BP 98/68 | HR 72 | Temp 98.1°F | Resp 18 | Ht 61.0 in | Wt 194.0 lb

## 2018-12-20 DIAGNOSIS — I10 Essential (primary) hypertension: Secondary | ICD-10-CM | POA: Diagnosis not present

## 2018-12-20 DIAGNOSIS — N183 Chronic kidney disease, stage 3 unspecified: Secondary | ICD-10-CM

## 2018-12-20 DIAGNOSIS — E039 Hypothyroidism, unspecified: Secondary | ICD-10-CM

## 2018-12-20 NOTE — Progress Notes (Signed)
Subjective:    [name], female    DOB: [birth year], 73 y.o.   MRN: [id number]  Medication Refill  Thyroid Problem   Here today for follow up of her chronic medical problems.  Patient has a history of hypertension, chronic kidney disease stage III, and hypothyroidism.  Otherwise she is doing well.  She does have a superficial scaly lesion on the dorsum of her left wrist.  Is approximately 6 mm in diameter.  Is an erythematous macule with fine white scale.  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  I have recommended that we clinically monitor this and if it grows or changes in any way we can perform an excisional biopsy.  Patient is comfortable with this plan.  She is also recently got back from vacation where she was eating poorly which may account for the rise in her fasting blood sugar she denies any chest pain shortness of breath or dyspnea on exertion most recent labs are listed below: Appointment on 12/13/2018  Component Date Value Ref Range Status  . WBC 12/13/2018 7.2  3.8 - 10.8 Thousand/uL Final  . RBC 12/13/2018 4.37  3.80 - 5.10 Million/uL Final  . Hemoglobin 12/13/2018 13.5  11.7 - 15.5 g/dL Final  . HCT 78/29/562106/15/2020 40.6  35.0 - 45.0 % Final  . MCV 12/13/2018 92.9  80.0 - 100.0 fL Final  . MCH 12/13/2018 30.9  27.0 - 33.0 pg Final  . MCHC 12/13/2018 33.3  32.0 - 36.0 g/dL Final  . RDW 30/86/578406/15/2020 11.8  11.0 - 15.0 % Final  . Platelets 12/13/2018 252  140 - 400 Thousand/uL Final  . MPV 12/13/2018 10.3  7.5 - 12.5 fL Final  . Neutro Abs 12/13/2018 3,290  1,500 - 7,800 cells/uL Final  . Lymphs Abs 12/13/2018 3,002  850 - 3,900 cells/uL Final  . Absolute Monocytes 12/13/2018 547  200 - 950 cells/uL Final  . Eosinophils Absolute 12/13/2018 302  15 - 500 cells/uL Final  . Basophils Absolute 12/13/2018 58  0 - 200 cells/uL Final  . Neutrophils Relative % 12/13/2018 45.7  % Final  . Total Lymphocyte 12/13/2018 41.7  % Final  . Monocytes Relative 12/13/2018 7.6  %  Final  . Eosinophils Relative 12/13/2018 4.2  % Final  . Basophils Relative 12/13/2018 0.8  % Final  . Glucose, Bld 12/13/2018 108* 65 - 99 mg/dL Final   Comment: .            Fasting reference interval . For someone without known diabetes, a glucose value between 100 and 125 mg/dL is consistent with prediabetes and should be confirmed with a follow-up test. .   . BUN 12/13/2018 22  7 - 25 mg/dL Final  . Creat 69/62/952806/15/2020 1.08* 0.60 - 0.93 mg/dL Final   Comment: For patients >73 years of age, the reference limit for Creatinine is approximately 13% higher for people identified as African-American. .   [name]. BUN/Creatinine Ratio 12/13/2018 20  6 - 22 (calc) Final  . Sodium 12/13/2018 138  135 - 146 mmol/L Final  . Potassium 12/13/2018 4.5  3.5 - 5.3 mmol/L Final  . Chloride 12/13/2018 103  98 - 110 mmol/L Final  . CO2 12/13/2018 25  20 - 32 mmol/L Final  . Calcium 12/13/2018 9.5  8.6 - 10.4 mg/dL Final  . Total Protein 12/13/2018 6.0* 6.1 - 8.1 g/dL Final  . Albumin 41/32/440106/15/2020 3.6  3.6 - 5.1 g/dL Final  . Globulin 02/72/536606/15/2020 2.4  1.9 - 3.7  g/dL (calc) Final  . AG Ratio 12/13/2018 1.5  1.0 - 2.5 (calc) Final  . Total Bilirubin 12/13/2018 0.7  0.2 - 1.2 mg/dL Final  . Alkaline phosphatase (APISO) 12/13/2018 54  37 - 153 U/L Final  . AST 12/13/2018 22  10 - 35 U/L Final  . ALT 12/13/2018 15  6 - 29 U/L Final  . Cholesterol 12/13/2018 169  <200 mg/dL Final  . HDL 16/10/960406/15/2020 50  > OR = 50 mg/dL Final  . Triglycerides 12/13/2018 97  <150 mg/dL Final  . LDL Cholesterol (Calc) 12/13/2018 100* mg/dL (calc) Final   Comment: Reference range: <100 . Desirable range <100 mg/dL for primary prevention;   <70 mg/dL for patients with CHD or diabetic patients  with > or = 2 CHD risk factors. Marland Kitchen. LDL-C is now calculated using the Martin-Hopkins  calculation, which is a validated novel method providing  better accuracy than the Friedewald equation in the  estimation of LDL-C.  Horald PollenMartin SS et al.  Len[place] AhrJAMA. 5409;811(912013;310(19): 2061-2068  (http://education.[facility].com/faq/FAQ164)   . Total CHOL/HDL Ratio 12/13/2018 3.4  <4.7<5.0 (calc) Final  . Non-HDL Cholesterol (Calc) 12/13/2018 119  <130 mg/dL (calc) Final   Comment: For patients with diabetes plus 1 major ASCVD risk  factor, treating to a non-HDL-C goal of <100 mg/dL  (LDL-C of <82<70 mg/dL) is considered a therapeutic  option.      Past Medical History:  Diagnosis Date  . CKD (chronic kidney disease) stage 3, GFR 30-59 ml/min (HCC)   . Hypertension   . Thyroid disease    hypothyroid   Past Surgical History:  Procedure Laterality Date  . ABDOMINAL HYSTERECTOMY    . CATARACT EXTRACTION    . KNEE SURGERY Left    11 surgery   . SHOULDER SURGERY Bilateral    Current Outpatient Medications on File Prior to Visit  Medication Sig Dispense Refill  . aspirin 81 MG tablet Take 81 mg by mouth daily.    Marland Kitchen. BIOTIN PO Take 1 tablet by mouth daily.    . diclofenac (VOLTAREN) 75 MG EC tablet Take 1 tablet (75 mg total) by mouth 2 (two) times daily. 50 tablet 2  . docusate sodium (COLACE) 100 MG capsule Take 200 mg by mouth at bedtime.     . fexofenadine (ALLEGRA) 180 MG tablet Take 1 tablet (180 mg total) by mouth daily. 90 tablet 4  . Ginkgo Biloba 40 MG TABS Take 1 tablet by mouth daily.     . hydrochlorothiazide (HYDRODIURIL) 25 MG tablet TAKE 1 TABLET BY MOUTH EVERY DAY 90 tablet 3  . levothyroxine (SYNTHROID) 112 MCG tablet Take 1 tablet (112 mcg total) by mouth daily. 90 tablet 1  . losartan (COZAAR) 100 MG tablet TAKE 1 TABLET BY MOUTH EVERY DAY 90 tablet 3  . Multiple Vitamins-Minerals (AIRBORNE PO) Take 2 tablets by mouth daily.     No current facility-administered medications on file prior to visit.    Allergies  Allergen Reactions  . Diclofenac Anaphylaxis    Pt stated, "upset my stomach and gave me diarrhea"  . Erythromycin Base Other (See Comments)  . Penicillins   . Tylenol [Acetaminophen]   . Cortisone Other (See  Comments) and Rash    given injections-skin peels/redness   Social History   Socioeconomic History  . Marital status: Married    Spouse name: Not on file  . Number of children: Not on file  . Years of education: Not on file  . Highest education level: Not  on file  Occupational History  . Not on file  Social Needs  . Financial resource strain: Not on file  . Food insecurity    Worry: Not on file    Inability: Not on file  . Transportation needs    Medical: Not on file    Non-medical: Not on file  Tobacco Use  . Smoking status: Never Smoker  . Smokeless tobacco: Never Used  Substance and Sexual Activity  . Alcohol use: Not on file  . Drug use: Not on file  . Sexual activity: Not on file  Lifestyle  . Physical activity    Days per week: Not on file    Minutes per session: Not on file  . Stress: Not on file  Relationships  . Social Herbalist on phone: Not on file    Gets together: Not on file    Attends religious service: Not on file    Active member of club or organization: Not on file    Attends meetings of clubs or organizations: Not on file    Relationship status: Not on file  . Intimate partner violence    Fear of current or ex partner: Not on file    Emotionally abused: Not on file    Physically abused: Not on file    Forced sexual activity: Not on file  Other Topics Concern  . Not on file  Social History Narrative  . Not on file      Review of Systems  All other systems reviewed and are negative.      Objective:   Physical Exam  Constitutional: She appears well-developed and well-nourished.  HENT:  Nose: Nose normal.  Mouth/Throat: Oropharynx is clear and moist.  Neck: Neck supple. No thyromegaly present.  Cardiovascular: Normal rate, regular rhythm and normal heart sounds.  Pulmonary/Chest: Effort normal and breath sounds normal. No respiratory distress. She has no wheezes. She has no rales.  Abdominal: Soft. Bowel sounds are normal.  She exhibits no distension. There is no abdominal tenderness. There is no rebound and no guarding.  Lymphadenopathy:    She has no cervical adenopathy.  Vitals reviewed.         Assessment & Plan:  The primary encounter diagnosis was Benign essential HTN. Diagnoses of Hypothyroidism, unspecified type and CKD (chronic kidney disease) stage 3, GFR 30-59 ml/min (HCC) were also pertinent to this visit. Apparently there was an issue with the lab and a TSH was not checked.  However the patient is asymptomatic.  She denies any weight loss or weight gain.  She denies any anxiety or palpitations.  She denies any fatigue or heat or cold intolerance.  Therefore we will defer a TSH until December.  Her blood pressure is adequately controlled today.  Her chronic kidney disease is stable.  There has been a slight rise in her fasting blood sugar and I have recommended decreasing her carbohydrate consumption.

## 2019-02-17 ENCOUNTER — Other Ambulatory Visit: Payer: Self-pay | Admitting: Family Medicine

## 2019-02-17 DIAGNOSIS — E559 Vitamin D deficiency, unspecified: Secondary | ICD-10-CM

## 2019-04-06 ENCOUNTER — Ambulatory Visit
Admission: RE | Admit: 2019-04-06 | Discharge: 2019-04-06 | Disposition: A | Discharge status: Home / Self Care | Payer: Medicare HMO | Source: Ambulatory Visit | Attending: Family Medicine | Admitting: Family Medicine

## 2019-04-06 ENCOUNTER — Other Ambulatory Visit: Payer: Self-pay

## 2019-04-06 DIAGNOSIS — Z1231 Encounter for screening mammogram for malignant neoplasm of breast: Secondary | ICD-10-CM | POA: Diagnosis not present

## 2019-04-06 DIAGNOSIS — E559 Vitamin D deficiency, unspecified: Secondary | ICD-10-CM

## 2019-04-14 ENCOUNTER — Other Ambulatory Visit: Payer: Self-pay | Admitting: Family Medicine

## 2019-04-14 DIAGNOSIS — R69 Illness, unspecified: Secondary | ICD-10-CM | POA: Diagnosis not present

## 2019-04-28 ENCOUNTER — Other Ambulatory Visit: Payer: Self-pay | Admitting: Family Medicine

## 2019-05-12 ENCOUNTER — Ambulatory Visit (INDEPENDENT_AMBULATORY_CARE_PROVIDER_SITE_OTHER): Payer: Medicare HMO | Admitting: Family Medicine

## 2019-05-12 ENCOUNTER — Encounter: Payer: Self-pay | Admitting: Family Medicine

## 2019-05-12 ENCOUNTER — Other Ambulatory Visit: Payer: Self-pay

## 2019-05-12 VITALS — BP 100/62 | HR 82 | Temp 98.3°F | Resp 14 | Ht 61.0 in | Wt 187.0 lb

## 2019-05-12 DIAGNOSIS — H6981 Other specified disorders of Eustachian tube, right ear: Secondary | ICD-10-CM | POA: Diagnosis not present

## 2019-05-12 MED ORDER — LEVOCETIRIZINE DIHYDROCHLORIDE 5 MG PO TABS: 5.0000 mg | ORAL_TABLET | Freq: Every evening | ORAL | 0 refills | Status: DC

## 2019-05-12 MED ORDER — AZITHROMYCIN 250 MG PO TABS: ORAL_TABLET | ORAL | 0 refills | Status: DC

## 2019-05-12 MED ORDER — FLUTICASONE PROPIONATE 50 MCG/ACT NA SUSP: 2.0000 | Freq: Every day | NASAL | 6 refills | Status: DC

## 2019-05-12 NOTE — Progress Notes (Signed)
Subjective:    [name], female    DOB: [birth year], 73 y.o.   MRN: [id number]  6 weeks ago, the patient flew with her husband to attend a relative's funeral.  Ever since that time she has had pressure in her right ear.  It feels full.  It feels like there is water stuck in her ear.  She is unable to equalize pressure.  As result her hearing feels diminished in the right ear.  She denies any pain.  She denies any sinus pain.  She denies any facial pain or headache.  She does report some tenderness in the right neck however there is no palpable abnormality or lymphadenopathy.  Instead the patient is tender to palpation over the anterior border of the sternocleidomastoid just at the angle of the mandible.  There are no palpable masses in that area.  Patient denies any tinnitus or vertigo or headaches.  She does report fullness in the ear as well as increased sinus congestion and postnasal drip and allergy symptoms despite taking Allegra.  Past Medical History:  Diagnosis Date  . CKD (chronic kidney disease) stage 3, GFR 30-59 ml/min   . Hypertension   . Thyroid disease    hypothyroid   Past Surgical History:  Procedure Laterality Date  . ABDOMINAL HYSTERECTOMY    . CATARACT EXTRACTION    . KNEE SURGERY Left    11 surgery   . SHOULDER SURGERY Bilateral    Current Outpatient Medications on File Prior to Visit  Medication Sig Dispense Refill  . aspirin 81 MG tablet Take 81 mg by mouth daily.    Marland Kitchen BIOTIN PO Take 1 tablet by mouth daily.    Marland Kitchen docusate sodium (COLACE) 100 MG capsule Take 200 mg by mouth at bedtime.     . fexofenadine (ALLEGRA) 180 MG tablet Take 1 tablet (180 mg total) by mouth daily. 90 tablet 4  . Ginkgo Biloba 40 MG TABS Take 1 tablet by mouth daily.     . hydrochlorothiazide (HYDRODIURIL) 25 MG tablet TAKE 1 TABLET BY MOUTH EVERY DAY 90 tablet 3  . levothyroxine (SYNTHROID) 112 MCG tablet TAKE 1 TABLET BY MOUTH EVERY DAY 90 tablet 1  . losartan  (COZAAR) 100 MG tablet TAKE 1 TABLET BY MOUTH EVERY DAY 90 tablet 0  . Multiple Vitamins-Minerals (AIRBORNE PO) Take 2 tablets by mouth daily.     No current facility-administered medications on file prior to visit.    Allergies  Allergen Reactions  . Diclofenac Anaphylaxis    Pt stated, "upset my stomach and gave me diarrhea"  . Erythromycin Base Other (See Comments)  . Penicillins   . Tylenol [Acetaminophen]   . Cortisone Other (See Comments) and Rash    given injections-skin peels/redness   Social History   Socioeconomic History  . Marital status: Married    Spouse name: Not on file  . Number of children: Not on file  . Years of education: Not on file  . Highest education level: Not on file  Occupational History  . Not on file  Social Needs  . Financial resource strain: Not on file  . Food insecurity    Worry: Not on file    Inability: Not on file  . Transportation needs    Medical: Not on file    Non-medical: Not on file  Tobacco Use  . Smoking status: Never Smoker  . Smokeless tobacco: Never Used  Substance and Sexual Activity  . Alcohol use:  Not on file  . Drug use: Not on file  . Sexual activity: Not on file  Lifestyle  . Physical activity    Days per week: Not on file    Minutes per session: Not on file  . Stress: Not on file  Relationships  . Social Musician on phone: Not on file    Gets together: Not on file    Attends religious service: Not on file    Active member of club or organization: Not on file    Attends meetings of clubs or organizations: Not on file    Relationship status: Not on file  . Intimate partner violence    Fear of current or ex partner: Not on file    Emotionally abused: Not on file    Physically abused: Not on file    Forced sexual activity: Not on file  Other Topics Concern  . Not on file  Social History Narrative  . Not on file      Review of Systems  All other systems reviewed and are negative.       Objective:   Physical Exam  Constitutional: She appears well-developed and well-nourished.  HENT:  Right Ear: Tympanic membrane and ear canal normal. No drainage, swelling or tenderness. No foreign bodies. No mastoid tenderness. Tympanic membrane is not injected, not scarred, not perforated, not erythematous, not retracted and not bulging. No middle ear effusion. No hemotympanum. Decreased hearing is noted.  Left Ear: Tympanic membrane and ear canal normal.  Nose: Nose normal.  Mouth/Throat: Oropharynx is clear and moist.  Neck: Neck supple. No thyromegaly present.  Cardiovascular: Normal rate, regular rhythm and normal heart sounds.  Pulmonary/Chest: Effort normal and breath sounds normal. No respiratory distress. She has no wheezes. She has no rales.  Abdominal: Soft. Bowel sounds are normal. She exhibits no distension. There is no abdominal tenderness. There is no rebound and no guarding.  Lymphadenopathy:    She has no cervical adenopathy.  Vitals reviewed.         Assessment & Plan:  Eustachian tube dysfunction, right  There is no evidence of otitis media or otitis externa or cerumen impaction.  Her history sounds more consistent with eustachian tube dysfunction in the right ear.  I recommended stopping Allegra.  Replace with Xyzal 5 mg p.o. nightly and add Flonase 2 sprays each nostril daily.  Recommended the patient perform daily Valsalva exercises by pinching her nose shut and closing her mouth and trying to blow her nose gently in order to insufflate the eustachian tube.  Also recommended that she chew gum frequently.  If over the next week if symptoms do not improve, patient may need to see ENT to discuss possible tympanostomy tube placement for persistent symptoms.  I recommended that the patient not fly until she is able to equalize pressure in her right ear.  If necessary she would like to see Dr. Suszanne Conners.

## 2019-06-03 ENCOUNTER — Other Ambulatory Visit: Payer: Self-pay | Admitting: Family Medicine

## 2019-06-14 ENCOUNTER — Other Ambulatory Visit: Payer: Medicare HMO

## 2019-06-14 ENCOUNTER — Other Ambulatory Visit: Payer: Self-pay | Admitting: Family Medicine

## 2019-06-14 DIAGNOSIS — E039 Hypothyroidism, unspecified: Secondary | ICD-10-CM

## 2019-06-14 DIAGNOSIS — N183 Chronic kidney disease, stage 3 unspecified: Secondary | ICD-10-CM

## 2019-06-14 DIAGNOSIS — I10 Essential (primary) hypertension: Secondary | ICD-10-CM

## 2019-06-14 DIAGNOSIS — E785 Hyperlipidemia, unspecified: Secondary | ICD-10-CM

## 2019-06-15 LAB — CBC WITH DIFFERENTIAL/PLATELET
Absolute Monocytes: 524 cells/uL (ref 200–950)
Basophils Absolute: 77 cells/uL (ref 0–200)
Basophils Relative: 1 %
Eosinophils Absolute: 277 cells/uL (ref 15–500)
Eosinophils Relative: 3.6 %
HCT: 45.1 % — ABNORMAL HIGH (ref 35.0–45.0)
Hemoglobin: 15.1 g/dL (ref 11.7–15.5)
Lymphs Abs: 3403 cells/uL (ref 850–3900)
MCH: 31.2 pg (ref 27.0–33.0)
MCHC: 33.5 g/dL (ref 32.0–36.0)
MCV: 93.2 fL (ref 80.0–100.0)
MPV: 10.2 fL (ref 7.5–12.5)
Monocytes Relative: 6.8 %
Neutro Abs: 3419 cells/uL (ref 1500–7800)
Neutrophils Relative %: 44.4 %
Platelets: 297 10*3/uL (ref 140–400)
RBC: 4.84 10*6/uL (ref 3.80–5.10)
RDW: 11.8 % (ref 11.0–15.0)
Total Lymphocyte: 44.2 %
WBC: 7.7 10*3/uL (ref 3.8–10.8)

## 2019-06-15 LAB — COMPREHENSIVE METABOLIC PANEL
AG Ratio: 1.4 (calc) (ref 1.0–2.5)
ALT: 14 U/L (ref 6–29)
AST: 15 U/L (ref 10–35)
Albumin: 3.9 g/dL (ref 3.6–5.1)
Alkaline phosphatase (APISO): 52 U/L (ref 37–153)
BUN/Creatinine Ratio: 17 (calc) (ref 6–22)
BUN: 18 mg/dL (ref 7–25)
CO2: 29 mmol/L (ref 20–32)
Calcium: 9.5 mg/dL (ref 8.6–10.4)
Chloride: 103 mmol/L (ref 98–110)
Creat: 1.03 mg/dL — ABNORMAL HIGH (ref 0.60–0.93)
Globulin: 2.7 g/dL (calc) (ref 1.9–3.7)
Glucose, Bld: 105 mg/dL — ABNORMAL HIGH (ref 65–99)
Potassium: 4.2 mmol/L (ref 3.5–5.3)
Sodium: 140 mmol/L (ref 135–146)
Total Bilirubin: 0.4 mg/dL (ref 0.2–1.2)
Total Protein: 6.6 g/dL (ref 6.1–8.1)

## 2019-06-15 LAB — LIPID PANEL
Cholesterol: 192 mg/dL (ref <200)
HDL: 56 mg/dL (ref >=50)
LDL Cholesterol (Calc): 114 mg/dL (calc) — ABNORMAL HIGH
Non-HDL Cholesterol (Calc): 136 mg/dL (calc) — ABNORMAL HIGH (ref <130)
Total CHOL/HDL Ratio: 3.4 (calc) (ref <5.0)
Triglycerides: 116 mg/dL (ref <150)

## 2019-06-15 LAB — TSH: TSH: 0.57 mIU/L (ref 0.40–4.50)

## 2019-06-21 ENCOUNTER — Other Ambulatory Visit: Payer: Self-pay

## 2019-06-21 ENCOUNTER — Ambulatory Visit (INDEPENDENT_AMBULATORY_CARE_PROVIDER_SITE_OTHER): Payer: Medicare HMO | Admitting: Family Medicine

## 2019-06-21 ENCOUNTER — Encounter: Payer: Self-pay | Admitting: Family Medicine

## 2019-06-21 VITALS — BP 110/72 | HR 66 | Temp 97.3°F | Resp 14 | Ht 63.5 in | Wt 191.0 lb

## 2019-06-21 DIAGNOSIS — E785 Hyperlipidemia, unspecified: Secondary | ICD-10-CM

## 2019-06-21 DIAGNOSIS — E039 Hypothyroidism, unspecified: Secondary | ICD-10-CM

## 2019-06-21 DIAGNOSIS — I10 Essential (primary) hypertension: Secondary | ICD-10-CM

## 2019-06-21 NOTE — Progress Notes (Signed)
Subjective:    [name], female    DOB: 12-14-1945, 73 y.o.   MRN: [id number]  Thyroid Problem  Medication Refill   Here today for follow up of her chronic medical problems.  Patient has a history of hypertension, chronic kidney disease stage III, and hypothyroidism.  Renal function remained stable.  Overall patient's been doing very well however her blood pressure is slightly low today.  She states that this is what is typically running at home.  Occasionally she feels lightheaded with position changes.  She denies any chest pain.  She denies any shortness of breath.  She denies any dyspnea on exertion.  Overall she is doing well and her TSH is within therapeutic limits. Appointment on 06/14/2019  Component Date Value Ref Range Status  . WBC 06/14/2019 7.7  3.8 - 10.8 Thousand/uL Final  . RBC 06/14/2019 4.84  3.80 - 5.10 Million/uL Final  . Hemoglobin 06/14/2019 15.1  11.7 - 15.5 g/dL Final  . HCT 81/82/9937 45.1* 35.0 - 45.0 % Final  . MCV 06/14/2019 93.2  80.0 - 100.0 fL Final  . MCH 06/14/2019 31.2  27.0 - 33.0 pg Final  . MCHC 06/14/2019 33.5  32.0 - 36.0 g/dL Final  . RDW 16/96/7893 11.8  11.0 - 15.0 % Final  . Platelets 06/14/2019 297  140 - 400 Thousand/uL Final  . MPV 06/14/2019 10.2  7.5 - 12.5 fL Final  . Neutro Abs 06/14/2019 3,419  1,500 - 7,800 cells/uL Final  . Lymphs Abs 06/14/2019 3,403  850 - 3,900 cells/uL Final  . Absolute Monocytes 06/14/2019 524  200 - 950 cells/uL Final  . Eosinophils Absolute 06/14/2019 277  15 - 500 cells/uL Final  . Basophils Absolute 06/14/2019 77  0 - 200 cells/uL Final  . Neutrophils Relative % 06/14/2019 44.4  % Final  . Total Lymphocyte 06/14/2019 44.2  % Final  . Monocytes Relative 06/14/2019 6.8  % Final  . Eosinophils Relative 06/14/2019 3.6  % Final  . Basophils Relative 06/14/2019 1.0  % Final  . Glucose, Bld 06/14/2019 105* 65 - 99 mg/dL Final   Comment: .            Fasting reference interval . For  someone without known diabetes, a glucose value between 100 and 125 mg/dL is consistent with prediabetes and should be confirmed with a follow-up test. .   . BUN 06/14/2019 18  7 - 25 mg/dL Final  . Creat 81/06/7508 1.03* 0.60 - 0.93 mg/dL Final   Comment: For patients >30 years of age, the reference limit for Creatinine is approximately 13% higher for people identified as African-American. .   [name] Ratio 06/14/2019 17  6 - 22 (calc) Final  . Sodium 06/14/2019 140  135 - 146 mmol/L Final  . Potassium 06/14/2019 4.2  3.5 - 5.3 mmol/L Final  . Chloride 06/14/2019 103  98 - 110 mmol/L Final  . CO2 06/14/2019 29  20 - 32 mmol/L Final  . Calcium 06/14/2019 9.5  8.6 - 10.4 mg/dL Final  . Total Protein 06/14/2019 6.6  6.1 - 8.1 g/dL Final  . Albumin 25/85/2778 3.9  3.6 - 5.1 g/dL Final  . Globulin 24/23/5361 2.7  1.9 - 3.7 g/dL (calc) Final  . AG Ratio 06/14/2019 1.4  1.0 - 2.5 (calc) Final  . Total Bilirubin 06/14/2019 0.4  0.2 - 1.2 mg/dL Final  . Alkaline phosphatase (APISO) 06/14/2019 52  37 - 153 U/L Final  . AST 06/14/2019 15  10 - 35 U/L Final  . ALT 06/14/2019 14  6 - 29 U/L Final  . Cholesterol 06/14/2019 192  <200 mg/dL Final  . HDL 16/10/960412/15/2020 56  > OR = 50 mg/dL Final  . Triglycerides 06/14/2019 116  <150 mg/dL Final  . LDL Cholesterol (Calc) 06/14/2019 114* mg/dL (calc) Final   Comment: Reference range: <100 . Desirable range <100 mg/dL for primary prevention;   <70 mg/dL for patients with CHD or diabetic patients  with > or = 2 CHD risk factors. Marland Kitchen. LDL-C is now calculated using the Martin-Hopkins  calculation, which is a validated novel method providing  better accuracy than the Friedewald equation in the  estimation of LDL-C.  Horald PollenMartin SS et al. Lenox AhrJAMA. 5409;811(912013;310(19): 2061-2068  (http://education.[facility].com/faq/FAQ164)   . Total CHOL/HDL Ratio 06/14/2019 3.4  <4.7<5.0 (calc) Final  . Non-HDL Cholesterol (Calc) 06/14/2019 136* <130 mg/dL (calc) Final    Comment: For patients with diabetes plus 1 major ASCVD risk  factor, treating to a non-HDL-C goal of <100 mg/dL  (LDL-C of <82<70 mg/dL) is considered a therapeutic  option.   Marland Kitchen. TSH 06/14/2019 0.57  0.40 - 4.50 mIU/L Final     Past Medical History:  Diagnosis Date  . CKD (chronic kidney disease) stage 3, GFR 30-59 ml/min   . Hypertension   . Thyroid disease    hypothyroid   Past Surgical History:  Procedure Laterality Date  . ABDOMINAL HYSTERECTOMY    . CATARACT EXTRACTION    . KNEE SURGERY Left    11 surgery   . SHOULDER SURGERY Bilateral    Current Outpatient Medications on File Prior to Visit  Medication Sig Dispense Refill  . aspirin 81 MG tablet Take 81 mg by mouth daily.    Marland Kitchen. azithromycin (ZITHROMAX) 250 MG tablet 2 tabs poqday1, 1 tab poqday 2-5 6 tablet 0  . BIOTIN PO Take 1 tablet by mouth daily.    Marland Kitchen. docusate sodium (COLACE) 100 MG capsule Take 200 mg by mouth at bedtime.     . fexofenadine (ALLEGRA) 180 MG tablet Take 1 tablet (180 mg total) by mouth daily. 90 tablet 4  . fluticasone (FLONASE) 50 MCG/ACT nasal spray Place 2 sprays into both nostrils daily. 16 g 6  . Ginkgo Biloba 40 MG TABS Take 1 tablet by mouth daily.     . hydrochlorothiazide (HYDRODIURIL) 25 MG tablet TAKE 1 TABLET BY MOUTH EVERY DAY 90 tablet 3  . levocetirizine (XYZAL) 5 MG tablet TAKE 1 TABLET BY MOUTH EVERY DAY IN THE EVENING 30 tablet 5  . levothyroxine (SYNTHROID) 112 MCG tablet TAKE 1 TABLET BY MOUTH EVERY DAY 90 tablet 1  . losartan (COZAAR) 100 MG tablet TAKE 1 TABLET BY MOUTH EVERY DAY 90 tablet 0  . Multiple Vitamins-Minerals (AIRBORNE PO) Take 2 tablets by mouth daily.     No current facility-administered medications on file prior to visit.   Allergies  Allergen Reactions  . Diclofenac Anaphylaxis    Pt stated, "upset my stomach and gave me diarrhea"  . Erythromycin Base Other (See Comments)  . Penicillins   . Tylenol [Acetaminophen]   . Cortisone Other (See Comments) and Rash      given injections-skin peels/redness   Social History   Socioeconomic History  . Marital status: Married    Spouse name: Not on file  . Number of children: Not on file  . Years of education: Not on file  . Highest education level: Not on file  Occupational History  .  Not on file  Tobacco Use  . Smoking status: Never Smoker  . Smokeless tobacco: Never Used  Substance and Sexual Activity  . Alcohol use: Not on file  . Drug use: Not on file  . Sexual activity: Not on file  Other Topics Concern  . Not on file  Social History Narrative  . Not on file   Social Determinants of Health   Financial Resource Strain:   . Difficulty of Paying Living Expenses: Not on file  Food Insecurity:   . Worried About Charity fundraiser in the Last Year: Not on file  . Ran Out of Food in the Last Year: Not on file  Transportation Needs:   . Lack of Transportation (Medical): Not on file  . Lack of Transportation (Non-Medical): Not on file  Physical Activity:   . Days of Exercise per Week: Not on file  . Minutes of Exercise per Session: Not on file  Stress:   . Feeling of Stress : Not on file  Social Connections:   . Frequency of Communication with Friends and Family: Not on file  . Frequency of Social Gatherings with Friends and Family: Not on file  . Attends Religious Services: Not on file  . Active Member of Clubs or Organizations: Not on file  . Attends Archivist Meetings: Not on file  . Marital Status: Not on file  Intimate Partner Violence:   . Fear of Current or Ex-Partner: Not on file  . Emotionally Abused: Not on file  . Physically Abused: Not on file  . Sexually Abused: Not on file      Review of Systems  All other systems reviewed and are negative.      Objective:   Physical Exam  Constitutional: She appears well-developed and well-nourished.  HENT:  Nose: Nose normal.  Mouth/Throat: Oropharynx is clear and moist.  Neck: No thyromegaly present.   Cardiovascular: Normal rate, regular rhythm and normal heart sounds.  Pulmonary/Chest: Effort normal and breath sounds normal. No respiratory distress. She has no wheezes. She has no rales.  Abdominal: Soft. Bowel sounds are normal. She exhibits no distension. There is no abdominal tenderness. There is no rebound and no guarding.  Musculoskeletal:     Cervical back: Neck supple.  Lymphadenopathy:    She has no cervical adenopathy.  Vitals reviewed.         Assessment & Plan:  Benign essential HTN  Hyperlipidemia, unspecified hyperlipidemia type  Hypothyroidism, unspecified type  Blood pressure today is low.  I recommended discontinuation of losartan and monitoring her blood pressure at home.  As long as her blood pressure remains less than 140/90 she may not require as much medication she is taking.  Her TSH is well within therapeutic limits.  LDL is slightly elevated.  I recommended dietary change and exercise to try to address this.  Renal function is stable.

## 2019-06-27 ENCOUNTER — Telehealth: Payer: Self-pay | Admitting: Family Medicine

## 2019-06-27 NOTE — Telephone Encounter (Signed)
Patient called in stating that since stopping her Losartan last week she has had the following readings: 142/92, 141/90, 136/90, 139/98, 140/82, 136/93. Patient would like to know if you would like for her to restart her Losartan? Please advise?

## 2019-06-28 MED ORDER — LOSARTAN POTASSIUM 50 MG PO TABS: 50.0000 mg | ORAL_TABLET | Freq: Every day | ORAL | 1 refills | Status: DC

## 2019-06-28 NOTE — Telephone Encounter (Signed)
Resume losartan at 50 mg a day

## 2019-06-28 NOTE — Telephone Encounter (Signed)
Spoke with patient and informed her to start Losartan 50 mg a day. Patient verbalized understanding.

## 2019-07-07 ENCOUNTER — Other Ambulatory Visit: Payer: Self-pay | Admitting: Family Medicine

## 2019-09-07 ENCOUNTER — Other Ambulatory Visit: Payer: Self-pay

## 2019-09-07 MED ORDER — LEVOTHYROXINE SODIUM 112 MCG PO TABS: 112.0000 ug | ORAL_TABLET | Freq: Every day | ORAL | 1 refills | Status: DC

## 2019-10-11 DIAGNOSIS — Z791 Long term (current) use of non-steroidal anti-inflammatories (NSAID): Secondary | ICD-10-CM | POA: Diagnosis not present

## 2019-10-11 DIAGNOSIS — G8929 Other chronic pain: Secondary | ICD-10-CM | POA: Diagnosis not present

## 2019-10-11 DIAGNOSIS — E039 Hypothyroidism, unspecified: Secondary | ICD-10-CM | POA: Diagnosis not present

## 2019-10-11 DIAGNOSIS — Z7982 Long term (current) use of aspirin: Secondary | ICD-10-CM | POA: Diagnosis not present

## 2019-10-11 DIAGNOSIS — R32 Unspecified urinary incontinence: Secondary | ICD-10-CM | POA: Diagnosis not present

## 2019-10-11 DIAGNOSIS — I10 Essential (primary) hypertension: Secondary | ICD-10-CM | POA: Diagnosis not present

## 2019-10-11 DIAGNOSIS — E669 Obesity, unspecified: Secondary | ICD-10-CM | POA: Diagnosis not present

## 2019-10-11 DIAGNOSIS — K5900 Constipation, unspecified: Secondary | ICD-10-CM | POA: Diagnosis not present

## 2019-10-11 DIAGNOSIS — J309 Allergic rhinitis, unspecified: Secondary | ICD-10-CM | POA: Diagnosis not present

## 2019-10-11 DIAGNOSIS — M1990 Unspecified osteoarthritis, unspecified site: Secondary | ICD-10-CM | POA: Diagnosis not present

## 2019-11-03 ENCOUNTER — Ambulatory Visit
Admission: RE | Admit: 2019-11-03 | Discharge: 2019-11-03 | Disposition: A | Discharge status: Home / Self Care | Payer: Medicare HMO | Source: Ambulatory Visit | Attending: Family Medicine | Admitting: Family Medicine

## 2019-11-03 ENCOUNTER — Encounter: Payer: Self-pay | Admitting: Family Medicine

## 2019-11-03 ENCOUNTER — Other Ambulatory Visit: Payer: Self-pay

## 2019-11-03 ENCOUNTER — Ambulatory Visit (INDEPENDENT_AMBULATORY_CARE_PROVIDER_SITE_OTHER): Payer: Medicare HMO | Admitting: Family Medicine

## 2019-11-03 VITALS — BP 130/78 | HR 70 | Temp 97.3°F | Resp 14 | Ht 63.5 in | Wt 190.0 lb

## 2019-11-03 DIAGNOSIS — M25531 Pain in right wrist: Secondary | ICD-10-CM | POA: Diagnosis not present

## 2019-11-03 DIAGNOSIS — S6991XA Unspecified injury of right wrist, hand and finger(s), initial encounter: Secondary | ICD-10-CM | POA: Diagnosis not present

## 2019-11-03 NOTE — Progress Notes (Signed)
Subjective:    [name], female    DOB: 1946/05/21, 74 y.o.   MRN: [id number]  3 weeks ago, the patient fell and landed on her right hand.  It was a fall against an outstretched wrist.  Ever since that time she has pain over the distal right ulna.  She has tenderness to palpation over the ulnar styloid process.  She has pain with flexion and extension.  She states that initially it was very swollen and sore.  The swelling has subsided.  However she is still exquisitely tender with palpation over the ulnar styloid process and over the distal ulna.  She has no pain over the anatomic snuffbox.  She does have some tenderness over the dorsal scapholunate joint.  There is no crepitus in the joint but there is significant pain with range of motion.  Past Medical History:  Diagnosis Date  . CKD (chronic kidney disease) stage 3, GFR 30-59 ml/min   . Hypertension   . Thyroid disease    hypothyroid   Past Surgical History:  Procedure Laterality Date  . ABDOMINAL HYSTERECTOMY    . CATARACT EXTRACTION    . KNEE SURGERY Left    11 surgery   . SHOULDER SURGERY Bilateral    Current Outpatient Medications on File Prior to Visit  Medication Sig Dispense Refill  . aspirin 81 MG tablet Take 81 mg by mouth daily.    Marland Kitchen BIOTIN PO Take 1 tablet by mouth daily.    Marland Kitchen docusate sodium (COLACE) 100 MG capsule Take 200 mg by mouth at bedtime.     . fluticasone (FLONASE) 50 MCG/ACT nasal spray Place 2 sprays into both nostrils daily. 16 g 6  . Ginkgo Biloba 40 MG TABS Take 1 tablet by mouth daily.     . hydrochlorothiazide (HYDRODIURIL) 25 MG tablet TAKE 1 TABLET BY MOUTH EVERY DAY 90 tablet 3  . levocetirizine (XYZAL) 5 MG tablet TAKE 1 TABLET BY MOUTH EVERY DAY IN THE EVENING 30 tablet 5  . levothyroxine (SYNTHROID) 112 MCG tablet Take 1 tablet (112 mcg total) by mouth daily. 90 tablet 1  . loratadine (CLARITIN) 10 MG tablet Take 10 mg by mouth daily.    Marland Kitchen losartan (COZAAR) 50 MG tablet  Take 1 tablet (50 mg total) by mouth daily. 90 tablet 1  . Multiple Vitamins-Minerals (AIRBORNE PO) Take 2 tablets by mouth daily.     No current facility-administered medications on file prior to visit.   Allergies  Allergen Reactions  . Diclofenac Anaphylaxis    Pt stated, "upset my stomach and gave me diarrhea"  . Erythromycin Base Other (See Comments)  . Penicillins   . Tylenol [Acetaminophen]   . Cortisone Other (See Comments) and Rash    given injections-skin peels/redness   Social History   Socioeconomic History  . Marital status: Married    Spouse name: Not on file  . Number of children: Not on file  . Years of education: Not on file  . Highest education level: Not on file  Occupational History  . Not on file  Tobacco Use  . Smoking status: Never Smoker  . Smokeless tobacco: Never Used  Substance and Sexual Activity  . Alcohol use: Not on file  . Drug use: Not on file  . Sexual activity: Not on file  Other Topics Concern  . Not on file  Social History Narrative  . Not on file   Social Determinants of Health   Financial Resource  Strain:   . Difficulty of Paying Living Expenses:   Food Insecurity:   . Worried About Programme researcher, broadcasting/film/video in the Last Year:   . Barista in the Last Year:   Transportation Needs:   . Freight forwarder (Medical):   Marland Kitchen Lack of Transportation (Non-Medical):   Physical Activity:   . Days of Exercise per Week:   . Minutes of Exercise per Session:   Stress:   . Feeling of Stress :   Social Connections:   . Frequency of Communication with Friends and Family:   . Frequency of Social Gatherings with Friends and Family:   . Attends Religious Services:   . Active Member of Clubs or Organizations:   . Attends Banker Meetings:   Marland Kitchen Marital Status:   Intimate Partner Violence:   . Fear of Current or Ex-Partner:   . Emotionally Abused:   Marland Kitchen Physically Abused:   . Sexually Abused:       Review of Systems  All  other systems reviewed and are negative.      Objective:   Physical Exam  Constitutional: She appears well-developed and well-nourished.  HENT:  Nose: Nose normal.  Mouth/Throat: Oropharynx is clear and moist.  Neck: No thyromegaly present.  Cardiovascular: Normal rate, regular rhythm and normal heart sounds.  Pulmonary/Chest: Effort normal and breath sounds normal. No respiratory distress. She has no wheezes. She has no rales.  Musculoskeletal:     Right wrist: Tenderness and bony tenderness present. No swelling, deformity, effusion, lacerations or crepitus. Decreased range of motion.     Cervical back: Neck supple.  Vitals reviewed.         Assessment & Plan:  Wrist pain, acute, right - Plan: DG Wrist Complete Right  Patient has sprained her wrist at a minimum.  However I am concerned about a possible fracture of the ulnar styloid process.  Recommended an x-ray of the right wrist immediately.  Patient will likely need a wrist splint for 2 to 3 weeks depending on the extent of the injury.  Await the results of the x-ray before determining the next course of action.

## 2019-11-14 DIAGNOSIS — H26491 Other secondary cataract, right eye: Secondary | ICD-10-CM | POA: Diagnosis not present

## 2019-11-14 DIAGNOSIS — H21232 Degeneration of iris (pigmentary), left eye: Secondary | ICD-10-CM | POA: Diagnosis not present

## 2019-11-14 DIAGNOSIS — H52203 Unspecified astigmatism, bilateral: Secondary | ICD-10-CM | POA: Diagnosis not present

## 2019-11-27 ENCOUNTER — Other Ambulatory Visit: Payer: Self-pay | Admitting: Family Medicine

## 2019-11-29 ENCOUNTER — Other Ambulatory Visit: Payer: Self-pay | Admitting: Family Medicine

## 2019-11-29 DIAGNOSIS — I10 Essential (primary) hypertension: Secondary | ICD-10-CM

## 2019-11-29 MED ORDER — HYDROCHLOROTHIAZIDE 25 MG PO TABS: 25.0000 mg | ORAL_TABLET | Freq: Every day | ORAL | 1 refills | Status: DC

## 2019-12-02 ENCOUNTER — Other Ambulatory Visit: Payer: Self-pay | Admitting: Family Medicine

## 2019-12-02 MED ORDER — LEVOCETIRIZINE DIHYDROCHLORIDE 5 MG PO TABS: ORAL_TABLET | ORAL | 5 refills | Status: DC

## 2019-12-15 ENCOUNTER — Other Ambulatory Visit: Payer: Medicare HMO

## 2019-12-15 ENCOUNTER — Other Ambulatory Visit: Payer: Self-pay

## 2019-12-15 DIAGNOSIS — I10 Essential (primary) hypertension: Secondary | ICD-10-CM | POA: Diagnosis not present

## 2019-12-15 DIAGNOSIS — E7800 Pure hypercholesterolemia, unspecified: Secondary | ICD-10-CM

## 2019-12-15 DIAGNOSIS — Z79899 Other long term (current) drug therapy: Secondary | ICD-10-CM

## 2019-12-15 DIAGNOSIS — E039 Hypothyroidism, unspecified: Secondary | ICD-10-CM | POA: Diagnosis not present

## 2019-12-15 LAB — CBC WITH DIFFERENTIAL/PLATELET
Absolute Monocytes: 454 cells/uL (ref 200–950)
Basophils Absolute: 83 cells/uL (ref 0–200)
Basophils Relative: 1.3 %
Eosinophils Absolute: 301 cells/uL (ref 15–500)
Eosinophils Relative: 4.7 %
HCT: 43.9 % (ref 35.0–45.0)
Hemoglobin: 14.7 g/dL (ref 11.7–15.5)
Lymphs Abs: 3136 cells/uL (ref 850–3900)
MCH: 30.7 pg (ref 27.0–33.0)
MCHC: 33.5 g/dL (ref 32.0–36.0)
MCV: 91.6 fL (ref 80.0–100.0)
MPV: 10 fL (ref 7.5–12.5)
Monocytes Relative: 7.1 %
Neutro Abs: 2426 cells/uL (ref 1500–7800)
Neutrophils Relative %: 37.9 %
Platelets: 285 10*3/uL (ref 140–400)
RBC: 4.79 10*6/uL (ref 3.80–5.10)
RDW: 12.2 % (ref 11.0–15.0)
Total Lymphocyte: 49 %
WBC: 6.4 10*3/uL (ref 3.8–10.8)

## 2019-12-15 LAB — LIPID PANEL
Cholesterol: 184 mg/dL (ref <200)
HDL: 52 mg/dL (ref >=50)
LDL Cholesterol (Calc): 111 mg/dL (calc) — ABNORMAL HIGH
Non-HDL Cholesterol (Calc): 132 mg/dL (calc) — ABNORMAL HIGH (ref <130)
Total CHOL/HDL Ratio: 3.5 (calc) (ref <5.0)
Triglycerides: 107 mg/dL (ref <150)

## 2019-12-15 LAB — COMPREHENSIVE METABOLIC PANEL
AG Ratio: 1.5 (calc) (ref 1.0–2.5)
ALT: 13 U/L (ref 6–29)
AST: 15 U/L (ref 10–35)
Albumin: 3.9 g/dL (ref 3.6–5.1)
Alkaline phosphatase (APISO): 53 U/L (ref 37–153)
BUN/Creatinine Ratio: 17 (calc) (ref 6–22)
BUN: 16 mg/dL (ref 7–25)
CO2: 30 mmol/L (ref 20–32)
Calcium: 9.4 mg/dL (ref 8.6–10.4)
Chloride: 103 mmol/L (ref 98–110)
Creat: 0.95 mg/dL — ABNORMAL HIGH (ref 0.60–0.93)
Globulin: 2.6 g/dL (calc) (ref 1.9–3.7)
Glucose, Bld: 105 mg/dL — ABNORMAL HIGH (ref 65–99)
Potassium: 3.8 mmol/L (ref 3.5–5.3)
Sodium: 141 mmol/L (ref 135–146)
Total Bilirubin: 0.6 mg/dL (ref 0.2–1.2)
Total Protein: 6.5 g/dL (ref 6.1–8.1)

## 2019-12-15 LAB — TSH: TSH: 0.05 mIU/L — ABNORMAL LOW (ref 0.40–4.50)

## 2019-12-16 ENCOUNTER — Other Ambulatory Visit: Payer: Self-pay | Admitting: Family Medicine

## 2019-12-16 MED ORDER — LEVOTHYROXINE SODIUM 100 MCG PO TABS
100.0000 ug | ORAL_TABLET | Freq: Every day | ORAL | 3 refills | Status: DC
Start: 2019-12-16 — End: 2020-02-10

## 2019-12-22 ENCOUNTER — Ambulatory Visit (INDEPENDENT_AMBULATORY_CARE_PROVIDER_SITE_OTHER): Payer: Medicare HMO | Admitting: Family Medicine

## 2019-12-22 ENCOUNTER — Other Ambulatory Visit: Payer: Self-pay

## 2019-12-22 VITALS — BP 115/70 | HR 65 | Temp 96.4°F | Ht 63.5 in | Wt 194.0 lb

## 2019-12-22 DIAGNOSIS — N183 Chronic kidney disease, stage 3 unspecified: Secondary | ICD-10-CM

## 2019-12-22 DIAGNOSIS — R2232 Localized swelling, mass and lump, left upper limb: Secondary | ICD-10-CM

## 2019-12-22 DIAGNOSIS — I10 Essential (primary) hypertension: Secondary | ICD-10-CM

## 2019-12-22 DIAGNOSIS — E039 Hypothyroidism, unspecified: Secondary | ICD-10-CM | POA: Diagnosis not present

## 2019-12-22 NOTE — Progress Notes (Signed)
Subjective:    [name], female    DOB: 1946-05-14, 74 y.o.   MRN: [id number]  Thyroid Problem  Medication Refill  12/20 Here today for follow up of her chronic medical problems.  Patient has a history of hypertension, chronic kidney disease stage III, and hypothyroidism.  Renal function remained stable.  Overall patient's been doing very well however her blood pressure is slightly low today.  She states that this is what is typically running at home.  Occasionally she feels lightheaded with position changes.  She denies any chest pain.  She denies any shortness of breath.  She denies any dyspnea on exertion.  Overall she is doing well and her TSH is within therapeutic limits.  At that time, my plan was: Blood pressure today is low.  I recommended discontinuation of losartan and monitoring her blood pressure at home.  As long as her blood pressure remains less than 140/90 she may not require as much medication she is taking.  Her TSH is well within therapeutic limits.  LDL is slightly elevated.  I recommended dietary change and exercise to try to address this.  Renal function is stable.  12/22/19 After her last visit, she called back and her blood pressure was slightly elevated.  Given her chronic kidney disease, we recommended resuming losartan 50 mg a day.  She is taking that at the present time.  Her blood pressure today is well controlled at 115/70.  She denies any dizziness or lightheadedness or chest pain or shortness of breath.  Her most recent lab work is listed below.  She has stable mild hyperlipidemia but not sufficient enough to warrant statin.  However there has been a drastic decrease in her TSH.  Previously she has been stable on 112 mcg of levothyroxine for quite some time.  She denies any change in the way she is taking the medication or any supplements or medicines that she is taking.  This is asymptomatic.  She denies any palpitations or hair loss or weight  loss. Lab on 12/15/2019  Component Date Value Ref Range Status   Cholesterol 12/15/2019 184  <200 mg/dL Final   HDL 73/41/9379 52  > OR = 50 mg/dL Final   Triglycerides 02/40/9735 107  <150 mg/dL Final   LDL Cholesterol (Calc) 12/15/2019 111* mg/dL (calc) Final   Comment: Reference range: <100 . Desirable range <100 mg/dL for primary prevention;   <70 mg/dL for patients with CHD or diabetic patients  with > or = 2 CHD risk factors. Marland Kitchen LDL-C is now calculated using the Martin-Hopkins  calculation, which is a validated novel method providing  better accuracy than the Friedewald equation in the  estimation of LDL-C.  Horald Pollen et al. Lenox Ahr. 3299;242(68): 2061-2068  (http://education.[facility].com/faq/FAQ164)    Total CHOL/HDL Ratio 12/15/2019 3.5  <3.4 (calc) Final   Non-HDL Cholesterol (Calc) 12/15/2019 132* <130 mg/dL (calc) Final   Comment: For patients with diabetes plus 1 major ASCVD risk  factor, treating to a non-HDL-C goal of <100 mg/dL  (LDL-C of <19 mg/dL) is considered a therapeutic  option.    Glucose, Bld 12/15/2019 105* 65 - 99 mg/dL Final   Comment: .            Fasting reference interval . For someone without known diabetes, a glucose value between 100 and 125 mg/dL is consistent with prediabetes and should be confirmed with a follow-up test. .    BUN 12/15/2019 16  7 - 25 mg/dL  Final   Creat 12/15/2019 0.95* 0.60 - 0.93 mg/dL Final   Comment: For patients >79 years of age, the reference limit for Creatinine is approximately 13% higher for people identified as African-American. .    BUN/Creatinine Ratio 12/15/2019 17  6 - 22 (calc) Final   Sodium 12/15/2019 141  135 - 146 mmol/L Final   Potassium 12/15/2019 3.8  3.5 - 5.3 mmol/L Final   Chloride 12/15/2019 103  98 - 110 mmol/L Final   CO2 12/15/2019 30  20 - 32 mmol/L Final   Calcium 12/15/2019 9.4  8.6 - 10.4 mg/dL Final   Total Protein 12/15/2019 6.5  6.1 - 8.1 g/dL Final    Albumin 12/15/2019 3.9  3.6 - 5.1 g/dL Final   Globulin 12/15/2019 2.6  1.9 - 3.7 g/dL (calc) Final   AG Ratio 12/15/2019 1.5  1.0 - 2.5 (calc) Final   Total Bilirubin 12/15/2019 0.6  0.2 - 1.2 mg/dL Final   Alkaline phosphatase (APISO) 12/15/2019 53  37 - 153 U/L Final   AST 12/15/2019 15  10 - 35 U/L Final   ALT 12/15/2019 13  6 - 29 U/L Final   WBC 12/15/2019 6.4  3.8 - 10.8 Thousand/uL Final   RBC 12/15/2019 4.79  3.80 - 5.10 Million/uL Final   Hemoglobin 12/15/2019 14.7  11.7 - 15.5 g/dL Final   HCT 12/15/2019 43.9  35 - 45 % Final   MCV 12/15/2019 91.6  80.0 - 100.0 fL Final   MCH 12/15/2019 30.7  27.0 - 33.0 pg Final   MCHC 12/15/2019 33.5  32.0 - 36.0 g/dL Final   RDW 12/15/2019 12.2  11.0 - 15.0 % Final   Platelets 12/15/2019 285  140 - 400 Thousand/uL Final   MPV 12/15/2019 10.0  7.5 - 12.5 fL Final   Neutro Abs 12/15/2019 2,426  1,500 - 7,800 cells/uL Final   Lymphs Abs 12/15/2019 3,136  850 - 3,900 cells/uL Final   Absolute Monocytes 12/15/2019 454  200 - 950 cells/uL Final   Eosinophils Absolute 12/15/2019 301  15 - 500 cells/uL Final   Basophils Absolute 12/15/2019 83  0 - 200 cells/uL Final   Neutrophils Relative % 12/15/2019 37.9  % Final   Total Lymphocyte 12/15/2019 49.0  % Final   Monocytes Relative 12/15/2019 7.1  % Final   Eosinophils Relative 12/15/2019 4.7  % Final   Basophils Relative 12/15/2019 1.3  % Final   TSH 12/15/2019 0.05* 0.40 - 4.50 mIU/L Final     Past Medical History:  Diagnosis Date   CKD (chronic kidney disease) stage 3, GFR 30-59 ml/min    Hypertension    Thyroid disease    hypothyroid   Past Surgical History:  Procedure Laterality Date   ABDOMINAL HYSTERECTOMY     CATARACT EXTRACTION     KNEE SURGERY Left    11 surgery    SHOULDER SURGERY Bilateral    Current Outpatient Medications on File Prior to Visit  Medication Sig Dispense Refill   aspirin 81 MG tablet Take 81 mg by mouth daily.      BIOTIN PO Take 1 tablet by mouth daily.     docusate sodium (COLACE) 100 MG capsule Take 200 mg by mouth at bedtime.      fluticasone (FLONASE) 50 MCG/ACT nasal spray Place 2 sprays into both nostrils daily. 16 g 6   Ginkgo Biloba 40 MG TABS Take 1 tablet by mouth daily.      hydrochlorothiazide (HYDRODIURIL) 25 MG tablet Take 1 tablet (  25 mg total) by mouth daily. 90 tablet 1   levocetirizine (XYZAL) 5 MG tablet TAKE 1 TABLET BY MOUTH EVERY DAY IN THE EVENING 30 tablet 5   levothyroxine (SYNTHROID) 100 MCG tablet Take 1 tablet (100 mcg total) by mouth daily. 90 tablet 3   loratadine (CLARITIN) 10 MG tablet Take 10 mg by mouth daily.     losartan (COZAAR) 50 MG tablet TAKE 1 TABLET BY MOUTH EVERY DAY 90 tablet 1   Multiple Vitamins-Minerals (AIRBORNE PO) Take 2 tablets by mouth daily.     No current facility-administered medications on file prior to visit.   Allergies  Allergen Reactions   Diclofenac Anaphylaxis    Pt stated, "upset my stomach and gave me diarrhea"   Erythromycin Base Other (See Comments)   Penicillins    Tylenol [Acetaminophen]    Cortisone Other (See Comments) and Rash    given injections-skin peels/redness   Social History   Socioeconomic History   Marital status: Married    Spouse name: Not on file   Number of children: Not on file   Years of education: Not on file   Highest education level: Not on file  Occupational History   Not on file  Tobacco Use   Smoking status: Never Smoker   Smokeless tobacco: Never Used  Substance and Sexual Activity   Alcohol use: Not on file   Drug use: Not on file   Sexual activity: Not on file  Other Topics Concern   Not on file  Social History Narrative   Not on file   Social Determinants of Health   Financial Resource Strain:    Difficulty of Paying Living Expenses:   Food Insecurity:    Worried About Running Out of Food in the Last Year:    Barista in the Last Year:     Transportation Needs:    Freight forwarder (Medical):    Lack of Transportation (Non-Medical):   Physical Activity:    Days of Exercise per Week:    Minutes of Exercise per Session:   Stress:    Feeling of Stress :   Social Connections:    Frequency of Communication with Friends and Family:    Frequency of Social Gatherings with Friends and Family:    Attends Religious Services:    Active Member of Clubs or Organizations:    Attends Engineer, structural:    Marital Status:   Intimate Partner Violence:    Fear of Current or Ex-Partner:    Emotionally Abused:    Physically Abused:    Sexually Abused:       Review of Systems  All other systems reviewed and are negative.      Objective:   Physical Exam Vitals reviewed.  Constitutional:      Appearance: She is well-developed.  HENT:     Nose: Nose normal.  Neck:     Thyroid: No thyromegaly.  Cardiovascular:     Rate and Rhythm: Normal rate and regular rhythm.     Heart sounds: Normal heart sounds.  Pulmonary:     Effort: Pulmonary effort is normal. No respiratory distress.     Breath sounds: Normal breath sounds. No wheezing or rales.  Abdominal:     General: Bowel sounds are normal. There is no distension.     Palpations: Abdomen is soft.     Tenderness: There is no abdominal tenderness. There is no guarding or rebound.  Musculoskeletal:  Hands:     Cervical back: Neck supple.  Lymphadenopathy:     Cervical: No cervical adenopathy.           Assessment & Plan:  Benign essential HTN  Hypothyroidism, unspecified type  Stage 3 chronic kidney disease, unspecified whether stage 3a or 3b CKD  Mass of finger of left hand - Plan: DG Hand Complete Left  Patient appears to be taking too much levothyroxine.  This is a significant change from her previous baseline.  I question possible lab error.  I would like to decrease her levothyroxine to 100 mcg a day and then recheck a  TSH in 6 weeks.  Blood pressure today is excellent.  The remainder of her lab work is normal.  She does have a nodule on the dorsal PIP joint of her left index finger.  This is well-circumscribed and approximately 4 to 5 mm in diameter.  Location would suggest a ganglion cyst however this is very hard.  Therefore I would like to obtain an x-ray of this.

## 2019-12-23 ENCOUNTER — Ambulatory Visit
Admission: RE | Admit: 2019-12-23 | Discharge: 2019-12-23 | Disposition: A | Discharge status: Home / Self Care | Payer: Medicare HMO | Source: Ambulatory Visit | Attending: Family Medicine | Admitting: Family Medicine

## 2019-12-23 DIAGNOSIS — M19042 Primary osteoarthritis, left hand: Secondary | ICD-10-CM | POA: Diagnosis not present

## 2019-12-23 DIAGNOSIS — R2232 Localized swelling, mass and lump, left upper limb: Secondary | ICD-10-CM

## 2020-02-09 ENCOUNTER — Other Ambulatory Visit: Payer: Self-pay

## 2020-02-09 ENCOUNTER — Other Ambulatory Visit: Payer: Medicare HMO

## 2020-02-09 DIAGNOSIS — E039 Hypothyroidism, unspecified: Secondary | ICD-10-CM

## 2020-02-09 LAB — TSH: TSH: 0.2 mIU/L — ABNORMAL LOW (ref 0.40–4.50)

## 2020-02-10 ENCOUNTER — Other Ambulatory Visit: Payer: Self-pay

## 2020-02-10 DIAGNOSIS — E039 Hypothyroidism, unspecified: Secondary | ICD-10-CM

## 2020-02-10 MED ORDER — LEVOTHYROXINE SODIUM 88 MCG PO TABS: 88.0000 ug | ORAL_TABLET | Freq: Every day | ORAL | 3 refills | Status: DC

## 2020-03-07 ENCOUNTER — Other Ambulatory Visit: Payer: Self-pay

## 2020-03-07 DIAGNOSIS — E039 Hypothyroidism, unspecified: Secondary | ICD-10-CM

## 2020-03-07 MED ORDER — LEVOTHYROXINE SODIUM 88 MCG PO TABS: 88.0000 ug | ORAL_TABLET | Freq: Every day | ORAL | 3 refills | Status: DC

## 2020-03-09 ENCOUNTER — Other Ambulatory Visit: Payer: Self-pay | Admitting: Family Medicine

## 2020-03-09 DIAGNOSIS — Z1231 Encounter for screening mammogram for malignant neoplasm of breast: Secondary | ICD-10-CM

## 2020-03-15 DIAGNOSIS — R69 Illness, unspecified: Secondary | ICD-10-CM | POA: Diagnosis not present

## 2020-04-05 ENCOUNTER — Other Ambulatory Visit: Payer: Medicare HMO

## 2020-04-05 DIAGNOSIS — E039 Hypothyroidism, unspecified: Secondary | ICD-10-CM | POA: Diagnosis not present

## 2020-04-05 LAB — TSH: TSH: 0.4 mIU/L (ref 0.40–4.50)

## 2020-04-09 ENCOUNTER — Ambulatory Visit: Payer: Medicare HMO

## 2020-04-25 ENCOUNTER — Encounter: Payer: Self-pay | Admitting: Family Medicine

## 2020-04-25 DIAGNOSIS — M79642 Pain in left hand: Secondary | ICD-10-CM | POA: Diagnosis not present

## 2020-04-25 DIAGNOSIS — M180 Bilateral primary osteoarthritis of first carpometacarpal joints: Secondary | ICD-10-CM | POA: Diagnosis not present

## 2020-04-25 DIAGNOSIS — M79641 Pain in right hand: Secondary | ICD-10-CM | POA: Diagnosis not present

## 2020-04-27 ENCOUNTER — Ambulatory Visit (INDEPENDENT_AMBULATORY_CARE_PROVIDER_SITE_OTHER): Payer: Medicare HMO | Admitting: Family Medicine

## 2020-04-27 ENCOUNTER — Other Ambulatory Visit: Payer: Self-pay

## 2020-04-27 VITALS — BP 150/90 | HR 70 | Temp 97.9°F | Ht 63.0 in | Wt 187.0 lb

## 2020-04-27 DIAGNOSIS — I10 Essential (primary) hypertension: Secondary | ICD-10-CM | POA: Diagnosis not present

## 2020-04-27 NOTE — Progress Notes (Signed)
Subjective:    [name], female    DOB: 20-Jun-1946, 74 y.o.   MRN: [id number]  Recently we stopped the patient's losartan.  She is still taking hydrochlorothiazide.  However after stopping losartan, she has noticed that her blood pressure has increased.  Her blood pressure at home is between 140 and 160 systolic.  She denies any chest pain shortness of breath or dyspnea on exertion.  Here today she is 150/90 confirming her blood pressure readings from home.  She denies being in pain.  She denies eating more salt.  She denies increased anxiety.  She denies any recent weight gain. Past Medical History:  Diagnosis Date   CKD (chronic kidney disease) stage 3, GFR 30-59 ml/min    Hypertension    Thyroid disease    hypothyroid   Past Surgical History:  Procedure Laterality Date   ABDOMINAL HYSTERECTOMY     CATARACT EXTRACTION     KNEE SURGERY Left    11 surgery    SHOULDER SURGERY Bilateral    Current Outpatient Medications on File Prior to Visit  Medication Sig Dispense Refill   aspirin 81 MG tablet Take 81 mg by mouth daily.     BIOTIN PO Take 1 tablet by mouth daily.     docusate sodium (COLACE) 100 MG capsule Take 200 mg by mouth at bedtime.      fluticasone (FLONASE) 50 MCG/ACT nasal spray Place 2 sprays into both nostrils daily. 16 g 6   Ginkgo Biloba 40 MG TABS Take 1 tablet by mouth daily.      hydrochlorothiazide (HYDRODIURIL) 25 MG tablet Take 1 tablet (25 mg total) by mouth daily. 90 tablet 1   levocetirizine (XYZAL) 5 MG tablet TAKE 1 TABLET BY MOUTH EVERY DAY IN THE EVENING 30 tablet 5   levothyroxine (SYNTHROID) 88 MCG tablet Take 1 tablet (88 mcg total) by mouth daily. 90 tablet 3   loratadine (CLARITIN) 10 MG tablet Take 10 mg by mouth daily.     Multiple Vitamins-Minerals (AIRBORNE PO) Take 2 tablets by mouth daily.     losartan (COZAAR) 50 MG tablet TAKE 1 TABLET BY MOUTH EVERY DAY (Patient not taking: Reported on 04/27/2020) 90  tablet 1   No current facility-administered medications on file prior to visit.   Allergies  Allergen Reactions   Diclofenac Anaphylaxis    Pt stated, "upset my stomach and gave me diarrhea"   Erythromycin Base Other (See Comments)   Penicillins    Tylenol [Acetaminophen]    Cortisone Other (See Comments) and Rash    given injections-skin peels/redness   Social History   Socioeconomic History   Marital status: Married    Spouse name: Not on file   Number of children: Not on file   Years of education: Not on file   Highest education level: Not on file  Occupational History   Not on file  Tobacco Use   Smoking status: Never Smoker   Smokeless tobacco: Never Used  Substance and Sexual Activity   Alcohol use: Not on file   Drug use: Not on file   Sexual activity: Not on file  Other Topics Concern   Not on file  Social History Narrative   Not on file   Social Determinants of Health   Financial Resource Strain:    Difficulty of Paying Living Expenses: Not on file  Food Insecurity:    Worried About Running Out of Food in the Last Year: Not on file  Ran Out of Food in the Last Year: Not on file  Transportation Needs:    Lack of Transportation (Medical): Not on file   Lack of Transportation (Non-Medical): Not on file  Physical Activity:    Days of Exercise per Week: Not on file   Minutes of Exercise per Session: Not on file  Stress:    Feeling of Stress : Not on file  Social Connections:    Frequency of Communication with Friends and Family: Not on file   Frequency of Social Gatherings with Friends and Family: Not on file   Attends Religious Services: Not on file   Active Member of Clubs or Organizations: Not on file   Attends Banker Meetings: Not on file   Marital Status: Not on file  Intimate Partner Violence:    Fear of Current or Ex-Partner: Not on file   Emotionally Abused: Not on file   Physically Abused: Not  on file   Sexually Abused: Not on file      Review of Systems  All other systems reviewed and are negative.      Objective:   Physical Exam Vitals reviewed.  Constitutional:      Appearance: She is well-developed.  HENT:     Nose: Nose normal.  Neck:     Thyroid: No thyromegaly.  Cardiovascular:     Rate and Rhythm: Normal rate and regular rhythm.     Heart sounds: Normal heart sounds.  Pulmonary:     Effort: Pulmonary effort is normal. No respiratory distress.     Breath sounds: Normal breath sounds. No wheezing or rales.  Abdominal:     General: Bowel sounds are normal. There is no distension.     Palpations: Abdomen is soft.     Tenderness: There is no abdominal tenderness. There is no guarding or rebound.  Musculoskeletal:     Cervical back: Neck supple.  Lymphadenopathy:     Cervical: No cervical adenopathy.           Assessment & Plan:  Benign essential HTN  Blood pressure is elevated.  Resume losartan 50 mg a day in addition to her hydrochlorothiazide and then recheck blood pressure over the next 2 weeks.

## 2020-05-21 ENCOUNTER — Other Ambulatory Visit: Payer: Self-pay | Admitting: Family Medicine

## 2020-05-21 DIAGNOSIS — I10 Essential (primary) hypertension: Secondary | ICD-10-CM

## 2020-05-29 ENCOUNTER — Other Ambulatory Visit: Payer: Self-pay | Admitting: Family Medicine

## 2020-05-30 ENCOUNTER — Other Ambulatory Visit: Payer: Self-pay

## 2020-05-30 ENCOUNTER — Ambulatory Visit
Admission: RE | Admit: 2020-05-30 | Discharge: 2020-05-30 | Disposition: A | Discharge status: Home / Self Care | Payer: Medicare HMO | Source: Ambulatory Visit | Attending: Family Medicine | Admitting: Family Medicine

## 2020-05-30 DIAGNOSIS — Z1231 Encounter for screening mammogram for malignant neoplasm of breast: Secondary | ICD-10-CM

## 2020-06-19 ENCOUNTER — Other Ambulatory Visit: Payer: Self-pay

## 2020-06-19 ENCOUNTER — Other Ambulatory Visit: Payer: Medicare HMO

## 2020-06-19 DIAGNOSIS — N183 Chronic kidney disease, stage 3 unspecified: Secondary | ICD-10-CM | POA: Diagnosis not present

## 2020-06-19 DIAGNOSIS — E785 Hyperlipidemia, unspecified: Secondary | ICD-10-CM | POA: Diagnosis not present

## 2020-06-19 DIAGNOSIS — E039 Hypothyroidism, unspecified: Secondary | ICD-10-CM | POA: Diagnosis not present

## 2020-06-19 LAB — CBC WITH DIFFERENTIAL/PLATELET
Absolute Monocytes: 517 cells/uL (ref 200–950)
Basophils Absolute: 82 cells/uL (ref 0–200)
Basophils Relative: 1.2 %
Eosinophils Absolute: 238 cells/uL (ref 15–500)
Eosinophils Relative: 3.5 %
HCT: 43.2 % (ref 35.0–45.0)
Hemoglobin: 14.8 g/dL (ref 11.7–15.5)
Lymphs Abs: 3427 cells/uL (ref 850–3900)
MCH: 31.9 pg (ref 27.0–33.0)
MCHC: 34.3 g/dL (ref 32.0–36.0)
MCV: 93.1 fL (ref 80.0–100.0)
MPV: 10 fL (ref 7.5–12.5)
Monocytes Relative: 7.6 %
Neutro Abs: 2536 cells/uL (ref 1500–7800)
Neutrophils Relative %: 37.3 %
Platelets: 277 10*3/uL (ref 140–400)
RBC: 4.64 10*6/uL (ref 3.80–5.10)
RDW: 12.1 % (ref 11.0–15.0)
Total Lymphocyte: 50.4 %
WBC: 6.8 10*3/uL (ref 3.8–10.8)

## 2020-06-19 LAB — COMPLETE METABOLIC PANEL WITH GFR
AG Ratio: 1.6 (calc) (ref 1.0–2.5)
ALT: 11 U/L (ref 6–29)
AST: 16 U/L (ref 10–35)
Albumin: 3.9 g/dL (ref 3.6–5.1)
Alkaline phosphatase (APISO): 53 U/L (ref 37–153)
BUN: 15 mg/dL (ref 7–25)
CO2: 30 mmol/L (ref 20–32)
Calcium: 9.6 mg/dL (ref 8.6–10.4)
Chloride: 102 mmol/L (ref 98–110)
Creat: 0.91 mg/dL (ref 0.60–0.93)
GFR, Est African American: 72 mL/min/{1.73_m2} (ref >=60)
GFR, Est Non African American: 62 mL/min/{1.73_m2} (ref >=60)
Globulin: 2.5 g/dL (calc) (ref 1.9–3.7)
Glucose, Bld: 108 mg/dL — ABNORMAL HIGH (ref 65–99)
Potassium: 3.8 mmol/L (ref 3.5–5.3)
Sodium: 140 mmol/L (ref 135–146)
Total Bilirubin: 0.5 mg/dL (ref 0.2–1.2)
Total Protein: 6.4 g/dL (ref 6.1–8.1)

## 2020-06-19 LAB — LIPID PANEL
Cholesterol: 173 mg/dL (ref <200)
HDL: 59 mg/dL (ref >=50)
LDL Cholesterol (Calc): 96 mg/dL (calc)
Non-HDL Cholesterol (Calc): 114 mg/dL (calc) (ref <130)
Total CHOL/HDL Ratio: 2.9 (calc) (ref <5.0)
Triglycerides: 90 mg/dL (ref <150)

## 2020-06-19 LAB — TSH: TSH: 1.15 mIU/L (ref 0.40–4.50)

## 2020-06-19 NOTE — Addendum Note (Signed)
Addended by: Roxy Cedar on: 06/19/2020 08:36 AM   Modules accepted: Orders

## 2020-06-25 ENCOUNTER — Other Ambulatory Visit: Payer: Self-pay

## 2020-06-25 ENCOUNTER — Ambulatory Visit (INDEPENDENT_AMBULATORY_CARE_PROVIDER_SITE_OTHER): Payer: Medicare HMO | Admitting: Family Medicine

## 2020-06-25 VITALS — BP 130/90 | HR 67 | Temp 97.6°F | Ht 63.0 in | Wt 193.0 lb

## 2020-06-25 DIAGNOSIS — E039 Hypothyroidism, unspecified: Secondary | ICD-10-CM | POA: Diagnosis not present

## 2020-06-25 DIAGNOSIS — I10 Essential (primary) hypertension: Secondary | ICD-10-CM | POA: Diagnosis not present

## 2020-06-25 MED ORDER — VALSARTAN 160 MG PO TABS: 160.0000 mg | ORAL_TABLET | Freq: Every day | ORAL | 3 refills | Status: DC

## 2020-06-25 NOTE — Progress Notes (Signed)
Subjective:    [name], female    DOB: August 12, 1945, 74 y.o.   MRN: [id number]  Patient is a very sweet 74 year old Caucasian female who is here today for a checkup.  All of her immunizations are up-to-date including her Covid booster and her flu shot.  Her blood pressure has been running slightly high diastolic.  Her diastolic blood pressures been 25-95.  She is on hydrochlorothiazide and losartan however she is only taking losartan 50 mg a day.  Her TSH on her most recent lab work was excellent indicating adequate repletion with levothyroxine: Lab on 06/19/2020  Component Date Value Ref Range Status  . Cholesterol 06/19/2020 173  <200 mg/dL Final  . HDL 63/87/5643 59  > OR = 50 mg/dL Final  . Triglycerides 06/19/2020 90  <150 mg/dL Final  . LDL Cholesterol (Calc) 06/19/2020 96  mg/dL (calc) Final   Comment: Reference range: <100 . Desirable range <100 mg/dL for primary prevention;   <70 mg/dL for patients with CHD or diabetic patients  with > or = 2 CHD risk factors. Marland Kitchen LDL-C is now calculated using the Martin-Hopkins  calculation, which is a validated novel method providing  better accuracy than the Friedewald equation in the  estimation of LDL-C.  Horald Pollen et al. Lenox Ahr. 3295;188(41): 2061-2068  (http://education.[facility].com/faq/FAQ164)   . Total CHOL/HDL Ratio 06/19/2020 2.9  <6.6 (calc) Final  . Non-HDL Cholesterol (Calc) 06/19/2020 114  <130 mg/dL (calc) Final   Comment: For patients with diabetes plus 1 major ASCVD risk  factor, treating to a non-HDL-C goal of <100 mg/dL  (LDL-C of <06 mg/dL) is considered a therapeutic  option.   . WBC 06/19/2020 6.8  3.8 - 10.8 Thousand/uL Final  . RBC 06/19/2020 4.64  3.80 - 5.10 Million/uL Final  . Hemoglobin 06/19/2020 14.8  11.7 - 15.5 g/dL Final  . HCT 30/16/0109 43.2  35.0 - 45.0 % Final  . MCV 06/19/2020 93.1  80.0 - 100.0 fL Final  . MCH 06/19/2020 31.9  27.0 - 33.0 pg Final  . MCHC 06/19/2020 34.3   32.0 - 36.0 g/dL Final  . RDW 32/35/5732 12.1  11.0 - 15.0 % Final  . Platelets 06/19/2020 277  140 - 400 Thousand/uL Final  . MPV 06/19/2020 10.0  7.5 - 12.5 fL Final  . Neutro Abs 06/19/2020 2,536  1,500 - 7,800 cells/uL Final  . Lymphs Abs 06/19/2020 3,427  850 - 3,900 cells/uL Final  . Absolute Monocytes 06/19/2020 517  200 - 950 cells/uL Final  . Eosinophils Absolute 06/19/2020 238  15 - 500 cells/uL Final  . Basophils Absolute 06/19/2020 82  0 - 200 cells/uL Final  . Neutrophils Relative % 06/19/2020 37.3  % Final  . Total Lymphocyte 06/19/2020 50.4  % Final  . Monocytes Relative 06/19/2020 7.6  % Final  . Eosinophils Relative 06/19/2020 3.5  % Final  . Basophils Relative 06/19/2020 1.2  % Final  . Glucose, Bld 06/19/2020 108* 65 - 99 mg/dL Final   Comment: .            Fasting reference interval . For someone without known diabetes, a glucose value between 100 and 125 mg/dL is consistent with prediabetes and should be confirmed with a follow-up test. .   . BUN 06/19/2020 15  7 - 25 mg/dL Final  . Creat 20/25/4270 0.91  0.60 - 0.93 mg/dL Final   Comment: For patients >26 years of age, the reference limit for Creatinine is approximately 13% higher  for people identified as African-American. .   . GFR, Est Non African American 06/19/2020 62  > OR = 60 mL/min/1.47m2 Final  . GFR, Est African American 06/19/2020 72  > OR = 60 mL/min/1.44m2 Final  . BUN/Creatinine Ratio 06/19/2020 NOT APPLICABLE  6 - 22 (calc) Final  . Sodium 06/19/2020 140  135 - 146 mmol/L Final  . Potassium 06/19/2020 3.8  3.5 - 5.3 mmol/L Final  . Chloride 06/19/2020 102  98 - 110 mmol/L Final  . CO2 06/19/2020 30  20 - 32 mmol/L Final  . Calcium 06/19/2020 9.6  8.6 - 10.4 mg/dL Final  . Total Protein 06/19/2020 6.4  6.1 - 8.1 g/dL Final  . Albumin 59/45/8592 3.9  3.6 - 5.1 g/dL Final  . Globulin 92/44/6286 2.5  1.9 - 3.7 g/dL (calc) Final  . AG Ratio 06/19/2020 1.6  1.0 - 2.5 (calc) Final  . Total  Bilirubin 06/19/2020 0.5  0.2 - 1.2 mg/dL Final  . Alkaline phosphatase (APISO) 06/19/2020 53  37 - 153 U/L Final  . AST 06/19/2020 16  10 - 35 U/L Final  . ALT 06/19/2020 11  6 - 29 U/L Final  . TSH 06/19/2020 1.15  0.40 - 4.50 mIU/L Final    Past Medical History:  Diagnosis Date  . CKD (chronic kidney disease) stage 3, GFR 30-59 ml/min (HCC)   . Hypertension   . Thyroid disease    hypothyroid   Past Surgical History:  Procedure Laterality Date  . ABDOMINAL HYSTERECTOMY    . CATARACT EXTRACTION    . KNEE SURGERY Left    11 surgery   . SHOULDER SURGERY Bilateral    Current Outpatient Medications on File Prior to Visit  Medication Sig Dispense Refill  . aspirin 81 MG tablet Take 81 mg by mouth daily.    Marland Kitchen BIOTIN PO Take 1 tablet by mouth daily.    Marland Kitchen docusate sodium (COLACE) 100 MG capsule Take 200 mg by mouth at bedtime.     . fluticasone (FLONASE) 50 MCG/ACT nasal spray Place 2 sprays into both nostrils daily. 16 g 6  . Ginkgo Biloba 40 MG TABS Take 1 tablet by mouth daily.     . hydrochlorothiazide (HYDRODIURIL) 25 MG tablet TAKE 1 TABLET BY MOUTH EVERY DAY 90 tablet 1  . levocetirizine (XYZAL) 5 MG tablet TAKE 1 TABLET BY MOUTH EVERY DAY IN THE EVENING 90 tablet 1  . levothyroxine (SYNTHROID) 88 MCG tablet Take 1 tablet (88 mcg total) by mouth daily. 90 tablet 3  . loratadine (CLARITIN) 10 MG tablet Take 10 mg by mouth daily.    Marland Kitchen losartan (COZAAR) 50 MG tablet TAKE 1 TABLET BY MOUTH EVERY DAY 90 tablet 1  . Multiple Vitamins-Minerals (AIRBORNE PO) Take 2 tablets by mouth daily.     No current facility-administered medications on file prior to visit.   Allergies  Allergen Reactions  . Diclofenac Anaphylaxis    Pt stated, "upset my stomach and gave me diarrhea"  . Erythromycin Base Other (See Comments)  . Penicillins   . Tylenol [Acetaminophen]   . Cortisone Other (See Comments) and Rash    given injections-skin peels/redness   Social History   Socioeconomic History   . Marital status: Married    Spouse name: Not on file  . Number of children: Not on file  . Years of education: Not on file  . Highest education level: Not on file  Occupational History  . Not on file  Tobacco Use  .  Smoking status: Never Smoker  . Smokeless tobacco: Never Used  Substance and Sexual Activity  . Alcohol use: Not on file  . Drug use: Not on file  . Sexual activity: Not on file  Other Topics Concern  . Not on file  Social History Narrative  . Not on file   Social Determinants of Health   Financial Resource Strain: Not on file  Food Insecurity: Not on file  Transportation Needs: Not on file  Physical Activity: Not on file  Stress: Not on file  Social Connections: Not on file  Intimate Partner Violence: Not on file      Review of Systems  All other systems reviewed and are negative.      Objective:   Physical Exam Vitals reviewed.  Constitutional:      Appearance: She is well-developed.  HENT:     Nose: Nose normal.  Neck:     Thyroid: No thyromegaly.  Cardiovascular:     Rate and Rhythm: Normal rate and regular rhythm.     Heart sounds: Normal heart sounds.  Pulmonary:     Effort: Pulmonary effort is normal. No respiratory distress.     Breath sounds: Normal breath sounds. No wheezing or rales.  Abdominal:     General: Bowel sounds are normal. There is no distension.     Palpations: Abdomen is soft.     Tenderness: There is no abdominal tenderness. There is no guarding or rebound.  Musculoskeletal:     Cervical back: Neck supple.  Lymphadenopathy:     Cervical: No cervical adenopathy.           Assessment & Plan:  Benign essential HTN  Hypothyroidism, unspecified type  Blood pressure could stand to be better.  Discontinue losartan and replace with valsartan 160 mg daily.  Hopefully this would afford better 24-hour coverage.  Also recommended decreasing her consumption of carbohydrates due to her elevated fasting blood sugar

## 2020-10-03 ENCOUNTER — Telehealth: Payer: Self-pay | Admitting: Family Medicine

## 2020-10-03 NOTE — Telephone Encounter (Signed)
Left message on patient's voicemail to advise that Shanda Bumps will do a covid test for her Jill [name] - advised patient results take 48 hours to [name] back. Requested call back from patient if she's like to schedule.

## 2020-10-12 ENCOUNTER — Telehealth: Payer: Self-pay | Admitting: Family Medicine

## 2020-10-12 NOTE — Progress Notes (Signed)
  Chronic Care Management   Outreach Note  10/12/2020 Name: Jill [name] MRN: [id number] DOB: 10/24/1945  Referred by: Donita Brooks, MD Reason for referral : No chief complaint on file.   An unsuccessful telephone outreach was attempted today. The patient was referred to the pharmacist for assistance with care management and care coordination.   Follow Up Plan:   Carley Perdue [employer] Scheduler

## 2020-10-16 ENCOUNTER — Telehealth: Payer: Self-pay | Admitting: Family Medicine

## 2020-10-16 NOTE — Progress Notes (Signed)
  Chronic Care Management   Outreach Note  10/16/2020 Name: Jill [name] MRN: [id number] DOB: 1946/04/28  Referred by: Donita Brooks, MD Reason for referral : No chief complaint on file.   A second unsuccessful telephone outreach was attempted today. The patient was referred to pharmacist for assistance with care management and care coordination.  Follow Up Plan:   Carley Perdue [employer] Scheduler

## 2020-10-25 ENCOUNTER — Telehealth: Payer: Self-pay | Admitting: Family Medicine

## 2020-10-25 NOTE — Progress Notes (Signed)
  Chronic Care Management   Note  10/25/2020 Name: Jill [name] MRN: [id number] DOB: [birth year]  Jill [name] is a 75 y.o. year old female who is a primary care patient of Tanya Nones, Priscille Heidelberg, MD. I reached out to Jill [name] by phone today in response to a referral sent by Ms. Heide Scales Mikowski's PCP, Donita Brooks, MD.   Ms. Tesoro was given information about Chronic Care Management services today including:  1. CCM service includes personalized support from designated clinical staff supervised by her physician, including individualized plan of care and coordination with other care providers 2. 24/7 contact phone numbers for assistance for urgent and routine care needs. 3. Service will only be billed when office clinical staff spend 20 minutes or more in a month to coordinate care. 4. Only one practitioner may furnish and bill the service in a calendar month. 5. The patient may stop CCM services at any time (effective at the end of the month) by phone call to the office staff.   Patient wishes to consider information provided and/or speak with a member of the care team before deciding about enrollment in care management services.   Follow up plan:   Carley Perdue [employer] Scheduler

## 2020-11-16 ENCOUNTER — Other Ambulatory Visit: Payer: Self-pay | Admitting: *Deleted

## 2020-11-16 DIAGNOSIS — I10 Essential (primary) hypertension: Secondary | ICD-10-CM

## 2020-11-16 MED ORDER — HYDROCHLOROTHIAZIDE 25 MG PO TABS: 1.0000 | ORAL_TABLET | Freq: Every day | ORAL | 1 refills | Status: DC

## 2020-11-21 NOTE — Progress Notes (Signed)
Subjective:   Jill [name] is a 75 y.o. female who presents for an Initial Medicare Annual Wellness Visit.  Review of Systems    N/A  Cardiac Risk Factors include: advanced age (>56men, >65 women);hypertension;obesity (BMI >30kg/m2)     Objective:    Today's Vitals   11/22/20 0822  BP: 108/64  Temp: 98 F (36.7 C)  TempSrc: Oral  Weight: 193 lb 4 oz (87.7 kg)  Height: 5\' 1"  (1.549 m)   Body mass index is 36.51 kg/m.  Advanced Directives 11/22/2020 02/14/2016 05/31/2015 06/07/2014  Does Patient Have a Medical Advance Directive? Yes No Yes Yes  Type of 14/02/2014 of [place];Living will - Healthcare Power of [place];Living will Living will;Healthcare Power of Attorney  Does patient want to make changes to medical advance directive? No - Patient declined - No - Patient declined -  Copy of Healthcare Power of Attorney in Chart? No - copy requested - No - copy requested No - copy requested    Current Medications (verified) Outpatient Encounter Medications as of 11/22/2020  Medication Sig  . aspirin 81 MG tablet Take 81 mg by mouth daily.  11/24/2020 BIOTIN PO Take 1 tablet by mouth daily.  Marland Kitchen docusate sodium (COLACE) 100 MG capsule Take 200 mg by mouth at bedtime.   . hydrochlorothiazide (HYDRODIURIL) 25 MG tablet Take 1 tablet (25 mg total) by mouth daily.  Marland Kitchen levothyroxine (SYNTHROID) 88 MCG tablet Take 1 tablet (88 mcg total) by mouth daily.  . Multiple Vitamins-Minerals (AIRBORNE PO) Take 2 tablets by mouth daily.  . valsartan (DIOVAN) 160 MG tablet Take 1 tablet (160 mg total) by mouth daily. Stop losartan  . fluticasone (FLONASE) 50 MCG/ACT nasal spray Place 2 sprays into both nostrils daily. (Patient not taking: Reported on 11/22/2020)  . Ginkgo Biloba 40 MG TABS Take 1 tablet by mouth daily.  (Patient not taking: Reported on 11/22/2020)  . levocetirizine (XYZAL) 5 MG tablet TAKE 1 TABLET BY MOUTH EVERY DAY IN THE EVENING (Patient not taking: Reported on  11/22/2020)  . loratadine (CLARITIN) 10 MG tablet Take 10 mg by mouth daily. (Patient not taking: Reported on 11/22/2020)   No facility-administered encounter medications on file as of 11/22/2020.    Allergies (verified) Diclofenac, Erythromycin base, Penicillins, Tylenol [acetaminophen], and Cortisone   History: Past Medical History:  Diagnosis Date  . CKD (chronic kidney disease) stage 3, GFR 30-59 ml/min (HCC)   . Hypertension   . Thyroid disease    hypothyroid   Past Surgical History:  Procedure Laterality Date  . ABDOMINAL HYSTERECTOMY    . CATARACT EXTRACTION    . KNEE SURGERY Left    11 surgery   . SHOULDER SURGERY Bilateral    Family History  Problem Relation Age of Onset  . Heart disease Father   . Heart disease Brother   . Heart disease Paternal Grandfather   . Heart disease Brother   . Breast cancer Cousin    Social History   Socioeconomic History  . Marital status: Married    Spouse name: Not on file  . Number of children: Not on file  . Years of education: Not on file  . Highest education level: Not on file  Occupational History  . Not on file  Tobacco Use  . Smoking status: Never Smoker  . Smokeless tobacco: Never Used  Substance and Sexual Activity  . Alcohol use: Not on file  . Drug use: Not on file  . Sexual activity: Not  on file  Other Topics Concern  . Not on file  Social History Narrative  . Not on file   Social Determinants of Health   Financial Resource Strain: Low Risk   . Difficulty of Paying Living Expenses: Not hard at all  Food Insecurity: No Food Insecurity  . Worried About Programme researcher, broadcasting/film/video in the Last Year: Never true  . Ran Out of Food in the Last Year: Never true  Transportation Needs: No Transportation Needs  . Lack of Transportation (Medical): No  . Lack of Transportation (Non-Medical): No  Physical Activity: Inactive  . Days of Exercise per Week: 0 days  . Minutes of Exercise per Session: 0 min  Stress: No Stress  Concern Present  . Feeling of Stress : Not at all  Social Connections: Socially Integrated  . Frequency of Communication with Friends and Family: More than three times a week  . Frequency of Social Gatherings with Friends and Family: More than three times a week  . Attends Religious Services: More than 4 times per year  . Active Member of Clubs or Organizations: Yes  . Attends Banker Meetings: More than 4 times per year  . Marital Status: Married    Tobacco Counseling Counseling given: Not Answered   Clinical Intake:  Pre-visit preparation completed: Yes  Pain : No/denies pain     Nutritional Risks: None Diabetes: No  How often do you need to have someone help you when you read instructions, pamphlets, or other written materials from your doctor or pharmacy?: 1 - Never  Diabetic?No  Interpreter Needed?: No  Information entered by :: SCrews,LPN   Activities of Daily Living In your present state of health, do you have any difficulty performing the following activities: 11/22/2020  Hearing? N  Vision? N  Difficulty concentrating or making decisions? Y  Comment Patient has issues with short term memory  Walking or climbing stairs? Y  Dressing or bathing? N  Doing errands, shopping? N  Preparing Food and eating ? N  Using the Toilet? N  In the past six months, have you accidently leaked urine? Y  Comment nocturnal leakage  Do you have problems with loss of bowel control? N  Managing your Medications? N  Managing your Finances? N  Housekeeping or managing your Housekeeping? N  Some recent data might be hidden    Patient Care Team: Donita Brooks, MD as PCP - General (Family Medicine)  Indicate any recent Medical Services you may have received from other than [facility] providers in the past year (date may be approximate).     Assessment:   This is a routine wellness examination for [facility].  Hearing/Vision screen  Hearing Screening   125Hz  250Hz   500Hz  1000Hz  2000Hz  3000Hz  4000Hz  6000Hz  8000Hz   Right ear:           Left ear:           Vision Screening Comments: Patient states gets eyes examined once per year. Wear reading glasses   Dietary issues and exercise activities discussed: Current Exercise Habits: The patient does not participate in regular exercise at present, Exercise limited by: orthopedic condition(s)  Goals Addressed            This Visit's Progress   . Weight (lb) < 160 lb (72.6 kg)   193 lb 4 oz (87.7 kg)     Depression Screen PHQ 2/9 Scores 11/22/2020 06/21/2019 11/10/2017 12/27/2014  PHQ - 2 Score 0 0 0 0  Fall Risk Fall Risk  11/22/2020 06/21/2019 11/10/2017 12/27/2014  Falls in the past year? 1 0 No No  Number falls in past yr: 0 - - -  Injury with Fall? 0 - - -  Risk for fall due to : No Fall Risks - - -  Follow up Falls evaluation completed;Falls prevention discussed Falls evaluation completed - -    FALL RISK PREVENTION PERTAINING TO THE HOME:  Any stairs in or around the home? Yes  If so, are there any without handrails? No  Home free of loose throw rugs in walkways, pet beds, electrical cords, etc? Yes  Adequate lighting in your home to reduce risk of falls? Yes   ASSISTIVE DEVICES UTILIZED TO PREVENT FALLS:  Life alert? No  Use of a cane, walker or w/c? No  Grab bars in the bathroom? No  Shower chair or bench in shower? Yes  Elevated toilet seat or a handicapped toilet? No   TIMED UP AND GO:  Was the test performed? Yes .  Length of time to ambulate 10 feet: 3 sec.   Gait steady and fast without use of assistive device  Cognitive Function:     6CIT Screen 11/22/2020  What Year? 0 points  What month? 0 points  What time? 0 points  Count back from 20 0 points  Months in reverse 0 points  Repeat phrase 0 points  Total Score 0    Immunizations Immunization History  Administered Date(s) Administered  . Influenza, High Dose Seasonal PF 04/06/2017, 04/14/2018, 04/14/2019,  03/15/2020  . Influenza,inj,Quad PF,6+ Mos 06/20/2015, 06/25/2016  . Influenza,inj,quad, With Preservative 04/14/2018  . Influenza-Unspecified 03/30/2017  . PFIZER(Purple Top)SARS-COV-2 Vaccination 09/16/2019, 10/07/2019, 04/05/2020, 10/01/2020  . Pneumococcal Conjugate-13 08/18/2013  . Pneumococcal Polysaccharide-23 09/22/2007, 06/25/2016  . Zoster Recombinat (Shingrix) 10/25/2019, 02/09/2020    TDAP status: Due, Education has been provided regarding the importance of this vaccine. Advised may receive this vaccine at local pharmacy or Health Dept. Aware to provide a copy of the vaccination record if obtained from local pharmacy or Health Dept. Verbalized acceptance and understanding.  Flu Vaccine status: Up to date  Pneumococcal vaccine status: Up to date  Covid-19 vaccine status: Completed vaccines  Qualifies for Shingles Vaccine? Yes   Zostavax completed No   Shingrix Completed?: Yes  Screening Tests Health Maintenance  Topic Date Due  . TETANUS/TDAP  Never done  . Fecal DNA (Cologuard)  01/25/2021  . INFLUENZA VACCINE  01/28/2021  . MAMMOGRAM  05/30/2021  . DEXA SCAN  Completed  . COVID-19 Vaccine  Completed  . Hepatitis C Screening  Completed  . PNA vac Low Risk Adult  Completed  . HPV VACCINES  Aged Out    Health Maintenance  Health Maintenance Due  Topic Date Due  . TETANUS/TDAP  Never done    Colorectal cancer screening: Type of screening: Cologuard. Completed 01/25/2018. Repeat every 3 years  Mammogram status: Completed 05/30/2020. Repeat every year  Bone Density status: Completed 01/08/2017. Results reflect: Bone density results: NORMAL. Repeat every 0 years.  Lung Cancer Screening: (Low Dose CT Chest recommended if Age 81-80 years, 30 pack-year currently smoking OR have quit w/in 15years.) does not qualify.   Lung Cancer Screening Referral: N/A   Additional Screening:  Hepatitis C Screening: does qualify; Completed 01/06/2017  Vision Screening:  Recommended annual ophthalmology exams for early detection of glaucoma and other disorders of the eye. Is the patient up to date with their annual eye exam?  Yes  Who is  the provider or what is the name of the office in which the patient attends annual eye exams? Dr. Antony Contras  If pt is not established with a provider, would they like to be referred to a provider to establish care? No .   Dental Screening: Recommended annual dental exams for proper oral hygiene  Community Resource Referral / Chronic Care Management: CRR required this visit?  No   CCM required this visit?  No      Plan:     I have personally reviewed and noted the following in the patient's chart:   . Medical and social history . Use of alcohol, tobacco or illicit drugs  . Current medications and supplements including opioid prescriptions. Patient is not currently taking opioid prescriptions. . Functional ability and status . Nutritional status . Physical activity . Advanced directives . List of other physicians . Hospitalizations, surgeries, and ER visits in previous 12 months . Vitals . Screenings to include cognitive, depression, and falls . Referrals and appointments  In addition, I have reviewed and discussed with patient certain preventive protocols, quality metrics, and best practice recommendations. A written personalized care plan for preventive services as well as general preventive health recommendations were provided to patient.     Theodora Blow, LPN   [id number]/[id number]   Nurse Notes: None

## 2020-11-22 ENCOUNTER — Ambulatory Visit (INDEPENDENT_AMBULATORY_CARE_PROVIDER_SITE_OTHER): Payer: Medicare HMO

## 2020-11-22 ENCOUNTER — Telehealth: Payer: Self-pay | Admitting: Family Medicine

## 2020-11-22 ENCOUNTER — Other Ambulatory Visit: Payer: Self-pay

## 2020-11-22 VITALS — BP 108/64 | Temp 98.0°F | Ht 61.0 in | Wt 193.2 lb

## 2020-11-22 DIAGNOSIS — Z0000 Encounter for general adult medical examination without abnormal findings: Secondary | ICD-10-CM

## 2020-11-22 NOTE — Patient Instructions (Signed)
[name] , Thank you for taking time to come for your Medicare Wellness Visit. I appreciate your ongoing commitment to your health goals. Please review the following plan we discussed and let me know if I can assist you in the future.   Screening recommendations/referrals: Colonoscopy: Up to date, next cologuard 01/25/2021 Mammogram: Up to date, next due 05/30/2021 Bone Density: No longer required  Recommended yearly ophthalmology/optometry visit for glaucoma screening and checkup Recommended yearly dental visit for hygiene and checkup  Vaccinations: Influenza vaccine: Up to date, next due fall 2022  Pneumococcal vaccine: completed series  Tdap vaccine : Currently due, you may await and injury to receive  Shingles vaccine: Completed series     Advanced directives: Please bring in copies of your advanced medical directives so that we may scan them into your chart.  Conditions/risks identified: None   Next appointment: 12/18/2020 @ 8:15 am with Dr. Tanya Nones   Preventive Care 65 Years and Older, Female Preventive care refers to lifestyle choices and visits with your health care provider that can promote health and wellness. What does preventive care include?  A yearly physical exam. This is also called an annual well check.  Dental exams once or twice a year.  Routine eye exams. Ask your health care provider how often you should have your eyes checked.  Personal lifestyle choices, including:  Daily care of your teeth and gums.  Regular physical activity.  Eating a healthy diet.  Avoiding tobacco and drug use.  Limiting alcohol use.  Practicing safe sex.  Taking low-dose aspirin every day.  Taking vitamin and mineral supplements as recommended by your health care provider. What happens during an annual well check? The services and screenings done by your health care provider during your annual well check will depend on your age, overall health, lifestyle risk factors,  and family history of disease. Counseling  Your health care provider may ask you questions about your:  Alcohol use.  Tobacco use.  Drug use.  Emotional well-being.  Home and relationship well-being.  Sexual activity.  Eating habits.  History of falls.  Memory and ability to understand (cognition).  Work and work Astronomer.  Reproductive health. Screening  You may have the following tests or measurements:  Height, weight, and BMI.  Blood pressure.  Lipid and cholesterol levels. These may be checked every 5 years, or more frequently if you are over 54 years old.  Skin check.  Lung cancer screening. You may have this screening every year starting at age 74 if you have a 30-pack-year history of smoking and currently smoke or have quit within the past 15 years.  Fecal occult blood test (FOBT) of the stool. You may have this test every year starting at age 22.  Flexible sigmoidoscopy or colonoscopy. You may have a sigmoidoscopy every 5 years or a colonoscopy every 10 years starting at age 35.  Hepatitis C blood test.  Hepatitis B blood test.  Sexually transmitted disease (STD) testing.  Diabetes screening. This is done by checking your blood sugar (glucose) after you have not eaten for a while (fasting). You may have this done every 1-3 years.  Bone density scan. This is done to screen for osteoporosis. You may have this done starting at age 28.  Mammogram. This may be done every 1-2 years. Talk to your health care provider about how often you should have regular mammograms. Talk with your health care provider about your test results, treatment options, and if necessary, the need  for more tests. Vaccines  Your health care provider may recommend certain vaccines, such as:  Influenza vaccine. This is recommended every year.  Tetanus, diphtheria, and acellular pertussis (Tdap, Td) vaccine. You may need a Td booster every 10 years.  Zoster vaccine. You may need  this after age 75.  Pneumococcal 13-valent conjugate (PCV13) vaccine. One dose is recommended after age 105.  Pneumococcal polysaccharide (PPSV23) vaccine. One dose is recommended after age 46. Talk to your health care provider about which screenings and vaccines you need and how often you need them. This information is not intended to replace advice given to you by your health care provider. Make sure you discuss any questions you have with your health care provider. Document Released: 07/13/2015 Document Revised: 03/05/2016 Document Reviewed: 04/17/2015 Elsevier Interactive Patient Education  2017 [place] Prevention in the Home Falls can cause injuries. They can happen to people of all ages. There are many things you can do to make your home safe and to help prevent falls. What can I do on the outside of my home?  Regularly fix the edges of walkways and driveways and fix any cracks.  Remove anything that might make you trip as you walk through a door, such as a raised step or threshold.  Trim any bushes or trees on the path to your home.  Use bright outdoor lighting.  Clear any walking paths of anything that might make someone trip, such as rocks or tools.  Regularly check to see if handrails are loose or broken. Make sure that both sides of any steps have handrails.  Any raised decks and porches should have guardrails on the edges.  Have any leaves, snow, or ice cleared regularly.  Use sand or salt on walking paths during winter.  Clean up any spills in your garage right away. This includes oil or grease spills. What can I do in the bathroom?  Use night lights.  Install grab bars by the toilet and in the tub and shower. Do not use towel bars as grab bars.  Use non-skid mats or decals in the tub or shower.  If you need to sit down in the shower, use a plastic, non-slip stool.  Keep the floor dry. Clean up any water that spills on the floor as soon as it  happens.  Remove soap buildup in the tub or shower regularly.  Attach bath mats securely with double-sided non-slip rug tape.  Do not have throw rugs and other things on the floor that can make you trip. What can I do in the bedroom?  Use night lights.  Make sure that you have a light by your bed that is easy to reach.  Do not use any sheets or blankets that are too big for your bed. They should not hang down onto the floor.  Have a firm chair that has side arms. You can use this for support while you get dressed.  Do not have throw rugs and other things on the floor that can make you trip. What can I do in the kitchen?  Clean up any spills right away.  Avoid walking on wet floors.  Keep items that you use a lot in easy-to-reach places.  If you need to reach something above you, use a strong step stool that has a grab bar.  Keep electrical cords out of the way.  Do not use floor polish or wax that makes floors slippery. If you must use wax, use  non-skid floor wax.  Do not have throw rugs and other things on the floor that can make you trip. What can I do with my stairs?  Do not leave any items on the stairs.  Make sure that there are handrails on both sides of the stairs and use them. Fix handrails that are broken or loose. Make sure that handrails are as long as the stairways.  Check any carpeting to make sure that it is firmly attached to the stairs. Fix any carpet that is loose or worn.  Avoid having throw rugs at the top or bottom of the stairs. If you do have throw rugs, attach them to the floor with carpet tape.  Make sure that you have a light switch at the top of the stairs and the bottom of the stairs. If you do not have them, ask someone to add them for you. What else can I do to help prevent falls?  Wear shoes that:  Do not have high heels.  Have rubber bottoms.  Are comfortable and fit you well.  Are closed at the toe. Do not wear sandals.  If you  use a stepladder:  Make sure that it is fully opened. Do not climb a closed stepladder.  Make sure that both sides of the stepladder are locked into place.  Ask someone to hold it for you, if possible.  Clearly mark and make sure that you can see:  Any grab bars or handrails.  First and last steps.  Where the edge of each step is.  Use tools that help you move around (mobility aids) if they are needed. These include:  Canes.  Walkers.  Scooters.  Crutches.  Turn on the lights when you go into a dark area. Replace any light bulbs as soon as they burn out.  Set up your furniture so you have a clear path. Avoid moving your furniture around.  If any of your floors are uneven, fix them.  If there are any pets around you, be aware of where they are.  Review your medicines with your doctor. Some medicines can make you feel dizzy. This can increase your chance of falling. Ask your doctor what other things that you can do to help prevent falls. This information is not intended to replace advice given to you by your health care provider. Make sure you discuss any questions you have with your health care provider. Document Released: 04/12/2009 Document Revised: 11/22/2015 Document Reviewed: 07/21/2014 Elsevier Interactive Patient Education  2017 [employer].

## 2020-11-22 NOTE — Telephone Encounter (Signed)
Patient brought in handicap placard form to be completed. Please see form on desk

## 2020-12-04 ENCOUNTER — Telehealth: Payer: Self-pay | Admitting: Family Medicine

## 2020-12-04 MED ORDER — LEVOCETIRIZINE DIHYDROCHLORIDE 5 MG PO TABS: 5.0000 mg | ORAL_TABLET | Freq: Every evening | ORAL | 1 refills | Status: DC

## 2020-12-04 NOTE — Telephone Encounter (Signed)
Patient called to follow up on refill request sent by pharmacy several times for levocetirizine (XYZAL) 5 MG tablet [[id number]]   Patient out of medication almost 1 week.  Pharmacy confirmed as   [id number] - [place], [place] - [address]  [address] [place] [address]  Phone:  [phone] Fax:  [phone]   Please advise at [phone].

## 2020-12-11 ENCOUNTER — Other Ambulatory Visit: Payer: Self-pay

## 2020-12-11 ENCOUNTER — Other Ambulatory Visit: Payer: Medicare HMO

## 2020-12-11 DIAGNOSIS — E039 Hypothyroidism, unspecified: Secondary | ICD-10-CM

## 2020-12-11 DIAGNOSIS — I10 Essential (primary) hypertension: Secondary | ICD-10-CM | POA: Diagnosis not present

## 2020-12-11 LAB — CBC WITH DIFFERENTIAL/PLATELET
Absolute Monocytes: 449 cells/uL (ref 200–950)
Basophils Absolute: 69 cells/uL (ref 0–200)
Basophils Relative: 1 %
Eosinophils Absolute: 262 cells/uL (ref 15–500)
Eosinophils Relative: 3.8 %
HCT: 43.9 % (ref 35.0–45.0)
Hemoglobin: 14.1 g/dL (ref 11.7–15.5)
Lymphs Abs: 3022 cells/uL (ref 850–3900)
MCH: 30.4 pg (ref 27.0–33.0)
MCHC: 32.1 g/dL (ref 32.0–36.0)
MCV: 94.6 fL (ref 80.0–100.0)
MPV: 10 fL (ref 7.5–12.5)
Monocytes Relative: 6.5 %
Neutro Abs: 3098 cells/uL (ref 1500–7800)
Neutrophils Relative %: 44.9 %
Platelets: 252 10*3/uL (ref 140–400)
RBC: 4.64 10*6/uL (ref 3.80–5.10)
RDW: 12 % (ref 11.0–15.0)
Total Lymphocyte: 43.8 %
WBC: 6.9 10*3/uL (ref 3.8–10.8)

## 2020-12-11 LAB — COMPLETE METABOLIC PANEL WITH GFR
AG Ratio: 1.5 (calc) (ref 1.0–2.5)
ALT: 8 U/L (ref 6–29)
AST: 14 U/L (ref 10–35)
Albumin: 3.8 g/dL (ref 3.6–5.1)
Alkaline phosphatase (APISO): 50 U/L (ref 37–153)
BUN/Creatinine Ratio: 17 (calc) (ref 6–22)
BUN: 17 mg/dL (ref 7–25)
CO2: 27 mmol/L (ref 20–32)
Calcium: 9.3 mg/dL (ref 8.6–10.4)
Chloride: 104 mmol/L (ref 98–110)
Creat: 1 mg/dL — ABNORMAL HIGH (ref 0.60–0.93)
GFR, Est African American: 64 mL/min/{1.73_m2} (ref >=60)
GFR, Est Non African American: 55 mL/min/{1.73_m2} — ABNORMAL LOW (ref >=60)
Globulin: 2.6 g/dL (calc) (ref 1.9–3.7)
Glucose, Bld: 101 mg/dL — ABNORMAL HIGH (ref 65–99)
Potassium: 4 mmol/L (ref 3.5–5.3)
Sodium: 140 mmol/L (ref 135–146)
Total Bilirubin: 0.5 mg/dL (ref 0.2–1.2)
Total Protein: 6.4 g/dL (ref 6.1–8.1)

## 2020-12-11 LAB — TSH: TSH: 0.58 mIU/L (ref 0.40–4.50)

## 2020-12-11 LAB — LIPID PANEL
Cholesterol: 177 mg/dL (ref <200)
HDL: 49 mg/dL — ABNORMAL LOW (ref >=50)
LDL Cholesterol (Calc): 105 mg/dL (calc) — ABNORMAL HIGH
Non-HDL Cholesterol (Calc): 128 mg/dL (calc) (ref <130)
Total CHOL/HDL Ratio: 3.6 (calc) (ref <5.0)
Triglycerides: 129 mg/dL (ref <150)

## 2020-12-18 ENCOUNTER — Other Ambulatory Visit: Payer: Self-pay

## 2020-12-18 ENCOUNTER — Ambulatory Visit (INDEPENDENT_AMBULATORY_CARE_PROVIDER_SITE_OTHER): Payer: Medicare HMO | Admitting: Family Medicine

## 2020-12-18 ENCOUNTER — Encounter: Payer: Self-pay | Admitting: Family Medicine

## 2020-12-18 VITALS — BP 110/62 | HR 66 | Temp 97.9°F | Resp 14 | Ht 61.0 in | Wt 192.0 lb

## 2020-12-18 DIAGNOSIS — E039 Hypothyroidism, unspecified: Secondary | ICD-10-CM

## 2020-12-18 DIAGNOSIS — I10 Essential (primary) hypertension: Secondary | ICD-10-CM | POA: Diagnosis not present

## 2020-12-18 DIAGNOSIS — N183 Chronic kidney disease, stage 3 unspecified: Secondary | ICD-10-CM | POA: Diagnosis not present

## 2020-12-18 MED ORDER — ZOLPIDEM TARTRATE 10 MG PO TABS
10.0000 mg | ORAL_TABLET | Freq: Every evening | ORAL | 1 refills | Status: DC | PRN
Start: 2020-12-18 — End: 2021-02-05

## 2020-12-18 NOTE — Progress Notes (Signed)
Subjective:    [name], female    DOB: 1945/10/07, 75 y.o.   MRN: [id number]  Patient is a very sweet 75 year old Caucasian female who is here today for a checkup.  Her blood pressure is outstanding.  However she reports that she feels sleepy tired all the time.  She denies feeling physically tired however she can fall asleep easily.  She states that she is not sleeping well at night.  She typically goes to bed at 1030 and then wakes up at 1 or 2:00 to have to urinate.  Afterward she has a hard time going back to sleep and then she has to get up at 6:00 for the rest of the day.  She believes this is why she is tired all the time.  She does fall asleep driving a car.  She denies fall asleep while reading or after eating or if watching TV.  She denies any chest pain shortness of breath or dyspnea on exertion.  Her most recent lab work is listed below and her thyroid test is normal.  Her cholesterol is acceptable.  Her liver and kidney test are within normal range, and there is no evidence of anemia  Lab on 12/11/2020  Component Date Value Ref Range Status   TSH 12/11/2020 0.58  0.40 - 4.50 mIU/L Final   Cholesterol 12/11/2020 177  <200 mg/dL Final   HDL 18/84/1660 49 (A) > OR = 50 mg/dL Final   Triglycerides 63/06/6008 129  <150 mg/dL Final   LDL Cholesterol (Calc) 12/11/2020 105 (A) mg/dL (calc) Final   Comment: Reference range: <100 . Desirable range <100 mg/dL for primary prevention;   <70 mg/dL for patients with CHD or diabetic patients  with > or = 2 CHD risk factors. Marland Kitchen LDL-C is now calculated using the Martin-Hopkins  calculation, which is a validated novel method providing  better accuracy than the Friedewald equation in the  estimation of LDL-C.  Horald Pollen et al. Lenox Ahr. 9323;557(32): 2061-2068  (http://education.[facility].com/faq/FAQ164)    Total CHOL/HDL Ratio 12/11/2020 3.6  <2.0 (calc) Final   Non-HDL Cholesterol (Calc) 12/11/2020 128  <130 mg/dL  (calc) Final   Comment: For patients with diabetes plus 1 major ASCVD risk  factor, treating to a non-HDL-C goal of <100 mg/dL  (LDL-C of <25 mg/dL) is considered a therapeutic  option.    Glucose, Bld 12/11/2020 101 (A) 65 - 99 mg/dL Final   Comment: .            Fasting reference interval . For someone without known diabetes, a glucose value between 100 and 125 mg/dL is consistent with prediabetes and should be confirmed with a follow-up test. .    BUN 12/11/2020 17  7 - 25 mg/dL Final   Creat 42/70/6237 1.00 (A) 0.60 - 0.93 mg/dL Final   Comment: For patients >72 years of age, the reference limit for Creatinine is approximately 13% higher for people identified as African-American. .    GFR, Est Non African American 12/11/2020 55 (A) > OR = 60 mL/min/1.19m2 Final   GFR, Est African American 12/11/2020 64  > OR = 60 mL/min/1.8m2 Final   BUN/Creatinine Ratio 12/11/2020 17  6 - 22 (calc) Final   Sodium 12/11/2020 140  135 - 146 mmol/L Final   Potassium 12/11/2020 4.0  3.5 - 5.3 mmol/L Final   Chloride 12/11/2020 104  98 - 110 mmol/L Final   CO2 12/11/2020 27  20 - 32 mmol/L Final   Calcium  12/11/2020 9.3  8.6 - 10.4 mg/dL Final   Total Protein 99/77/4142 6.4  6.1 - 8.1 g/dL Final   Albumin 39/53/2023 3.8  3.6 - 5.1 g/dL Final   Globulin 34/35/6861 2.6  1.9 - 3.7 g/dL (calc) Final   AG Ratio 12/11/2020 1.5  1.0 - 2.5 (calc) Final   Total Bilirubin 12/11/2020 0.5  0.2 - 1.2 mg/dL Final   Alkaline phosphatase (APISO) 12/11/2020 50  37 - 153 U/L Final   AST 12/11/2020 14  10 - 35 U/L Final   ALT 12/11/2020 8  6 - 29 U/L Final   WBC 12/11/2020 6.9  3.8 - 10.8 Thousand/uL Final   RBC 12/11/2020 4.64  3.80 - 5.10 Million/uL Final   Hemoglobin 12/11/2020 14.1  11.7 - 15.5 g/dL Final   HCT 68/37/2902 43.9  35.0 - 45.0 % Final   MCV 12/11/2020 94.6  80.0 - 100.0 fL Final   MCH 12/11/2020 30.4  27.0 - 33.0 pg Final   MCHC 12/11/2020 32.1  32.0 - 36.0 g/dL Final   RDW 05/15/5207  12.0  11.0 - 15.0 % Final   Platelets 12/11/2020 252  140 - 400 Thousand/uL Final   MPV 12/11/2020 10.0  7.5 - 12.5 fL Final   Neutro Abs 12/11/2020 3,098  1,500 - 7,800 cells/uL Final   Lymphs Abs 12/11/2020 3,022  850 - 3,900 cells/uL Final   Absolute Monocytes 12/11/2020 449  200 - 950 cells/uL Final   Eosinophils Absolute 12/11/2020 262  15 - 500 cells/uL Final   Basophils Absolute 12/11/2020 69  0 - 200 cells/uL Final   Neutrophils Relative % 12/11/2020 44.9  % Final   Total Lymphocyte 12/11/2020 43.8  % Final   Monocytes Relative 12/11/2020 6.5  % Final   Eosinophils Relative 12/11/2020 3.8  % Final   Basophils Relative 12/11/2020 1.0  % Final    Past Medical History:  Diagnosis Date   CKD (chronic kidney disease) stage 3, GFR 30-59 ml/min (HCC)    Hypertension    Thyroid disease    hypothyroid   Past Surgical History:  Procedure Laterality Date   ABDOMINAL HYSTERECTOMY     CATARACT EXTRACTION     KNEE SURGERY Left    11 surgery    SHOULDER SURGERY Bilateral    Current Outpatient Medications on File Prior to Visit  Medication Sig Dispense Refill   aspirin 81 MG tablet Take 81 mg by mouth daily.     BIOTIN PO Take 1 tablet by mouth daily.     docusate sodium (COLACE) 100 MG capsule Take 200 mg by mouth at bedtime.      fluticasone (FLONASE) 50 MCG/ACT nasal spray Place 2 sprays into both nostrils daily. (Patient not taking: Reported on 11/22/2020) 16 g 6   Ginkgo Biloba 40 MG TABS Take 1 tablet by mouth daily.  (Patient not taking: Reported on 11/22/2020)     hydrochlorothiazide (HYDRODIURIL) 25 MG tablet Take 1 tablet (25 mg total) by mouth daily. 90 tablet 1   levocetirizine (XYZAL) 5 MG tablet Take 1 tablet (5 mg total) by mouth every evening. 90 tablet 1   levothyroxine (SYNTHROID) 88 MCG tablet Take 1 tablet (88 mcg total) by mouth daily. 90 tablet 3   loratadine (CLARITIN) 10 MG tablet Take 10 mg by mouth daily. (Patient not taking: Reported on 11/22/2020)      Multiple Vitamins-Minerals (AIRBORNE PO) Take 2 tablets by mouth daily.     valsartan (DIOVAN) 160 MG tablet Take 1 tablet (  160 mg total) by mouth daily. Stop losartan 90 tablet 3   No current facility-administered medications on file prior to visit.   Allergies  Allergen Reactions   Diclofenac Anaphylaxis    Pt stated, "upset my stomach and gave me diarrhea"   Erythromycin Base Other (See Comments)   Penicillins    Tylenol [Acetaminophen]    Cortisone Other (See Comments) and Rash    given injections-skin peels/redness   Social History   Socioeconomic History   Marital status: Married    Spouse name: Not on file   Number of children: Not on file   Years of education: Not on file   Highest education level: Not on file  Occupational History   Not on file  Tobacco Use   Smoking status: Never   Smokeless tobacco: Never  Substance and Sexual Activity   Alcohol use: Not on file   Drug use: Not on file   Sexual activity: Not on file  Other Topics Concern   Not on file  Social History Narrative   Not on file   Social Determinants of Health   Financial Resource Strain: Low Risk    Difficulty of Paying Living Expenses: Not hard at all  Food Insecurity: No Food Insecurity   Worried About Running Out of Food in the Last Year: Never true   Ran Out of Food in the Last Year: Never true  Transportation Needs: No Transportation Needs   Lack of Transportation (Medical): No   Lack of Transportation (Non-Medical): No  Physical Activity: Inactive   Days of Exercise per Week: 0 days   Minutes of Exercise per Session: 0 min  Stress: No Stress Concern Present   Feeling of Stress : Not at all  Social Connections: Socially Integrated   Frequency of Communication with Friends and Family: More than three times a week   Frequency of Social Gatherings with Friends and Family: More than three times a week   Attends Religious Services: More than 4 times per year   Active Member of [employer] or  Organizations: Yes   Attends Engineer, structural: More than 4 times per year   Marital Status: Married  Catering manager Violence: Not At Risk   Fear of Current or Ex-Partner: No   Emotionally Abused: No   Physically Abused: No   Sexually Abused: No      Review of Systems  All other systems reviewed and are negative.     Objective:   Physical Exam Vitals reviewed.  Constitutional:      Appearance: She is well-developed.  HENT:     Nose: Nose normal.  Neck:     Thyroid: No thyromegaly.  Cardiovascular:     Rate and Rhythm: Normal rate and regular rhythm.     Heart sounds: Normal heart sounds.  Pulmonary:     Effort: Pulmonary effort is normal. No respiratory distress.     Breath sounds: Normal breath sounds. No wheezing or rales.  Abdominal:     General: Bowel sounds are normal. There is no distension.     Palpations: Abdomen is soft.     Tenderness: There is no abdominal tenderness. There is no guarding or rebound.  Musculoskeletal:     Cervical back: Neck supple.  Lymphadenopathy:     Cervical: No cervical adenopathy.          Assessment & Plan:  Benign essential HTN  Hypothyroidism, unspecified type  Stage 3 chronic kidney disease, unspecified whether stage 3a or 3b  CKD (HCC) I am very happy with her blood pressure.  Her thyroid test is within normal range and her cholesterol is excellent.  Her lab work is reassuring.  I feel that the fatigue is likely due to poor sleep.  We are going to try Ambien 5 mg p.o. nightly for a week or so to see if she feels better after getting a better night sleep.  If so we may consider using medicine for overactive bladder to help encourage better sleep at night.  If not, consider a sleep study to evaluate for sleep apnea

## 2020-12-19 ENCOUNTER — Telehealth: Payer: Self-pay | Admitting: *Deleted

## 2020-12-19 NOTE — Telephone Encounter (Signed)
Received PA determination.   PA approved 06/30/2020- 06/29/2021 

## 2020-12-19 NOTE — Telephone Encounter (Signed)
Received request from pharmacy for PA on Zolpidem.  PA submitted.   Dx: G47.00- insomnia.  Your information has been submitted to [employer]. [employer] will review the request and will issue a decision, typically within 1-3 days from your submission. You can check the updated outcome later by reopening this request.  If [employer] has not responded in 1-3 days or if you have any questions about your ePA request, please contact [employer] at [phone]

## 2020-12-24 ENCOUNTER — Ambulatory Visit: Payer: Medicare HMO | Admitting: Family Medicine

## 2020-12-25 ENCOUNTER — Telehealth: Payer: Self-pay | Admitting: *Deleted

## 2020-12-25 NOTE — Telephone Encounter (Signed)
Patient due for Cologuard re-screen.   Order placed via [employer].   Cologuard (Order [id number])

## 2021-01-01 ENCOUNTER — Ambulatory Visit: Payer: Medicare HMO | Admitting: Family Medicine

## 2021-01-24 DIAGNOSIS — Z1211 Encounter for screening for malignant neoplasm of colon: Secondary | ICD-10-CM | POA: Diagnosis not present

## 2021-01-24 DIAGNOSIS — Z1212 Encounter for screening for malignant neoplasm of rectum: Secondary | ICD-10-CM | POA: Diagnosis not present

## 2021-01-30 LAB — COLOGUARD
COLOGUARD: NEGATIVE
Cologuard: NEGATIVE

## 2021-01-31 DIAGNOSIS — H26491 Other secondary cataract, right eye: Secondary | ICD-10-CM | POA: Diagnosis not present

## 2021-01-31 DIAGNOSIS — H35372 Puckering of macula, left eye: Secondary | ICD-10-CM | POA: Diagnosis not present

## 2021-01-31 DIAGNOSIS — Z961 Presence of intraocular lens: Secondary | ICD-10-CM | POA: Diagnosis not present

## 2021-01-31 DIAGNOSIS — H524 Presbyopia: Secondary | ICD-10-CM | POA: Diagnosis not present

## 2021-02-05 ENCOUNTER — Encounter: Payer: Self-pay | Admitting: Family Medicine

## 2021-02-05 ENCOUNTER — Other Ambulatory Visit: Payer: Self-pay

## 2021-02-05 ENCOUNTER — Ambulatory Visit (INDEPENDENT_AMBULATORY_CARE_PROVIDER_SITE_OTHER): Payer: Medicare HMO | Admitting: Family Medicine

## 2021-02-05 VITALS — BP 120/66 | HR 64 | Temp 98.5°F | Resp 18 | Ht 61.0 in | Wt 193.0 lb

## 2021-02-05 DIAGNOSIS — L851 Acquired keratosis [keratoderma] palmaris et plantaris: Secondary | ICD-10-CM

## 2021-02-05 MED ORDER — ZOLPIDEM TARTRATE 10 MG PO TABS: 10.0000 mg | ORAL_TABLET | Freq: Every evening | ORAL | 4 refills | Status: DC | PRN

## 2021-02-05 NOTE — Progress Notes (Signed)
Subjective:    [name], female    DOB: Jan 13, 1946, 75 y.o.   MRN: [id number]  Patient has a painful lesion on the plantar aspect of the right fifth MTP joint.  There is a callus forming on the skin in that area and there is a central core that is roughly 2 mm in diameter.  The central core shows loss of dermal ridges.  There is no evidence of a capillary hemorrhage.  Differential diagnosis includes IPK versus plantar wart    Past Medical History:  Diagnosis Date   CKD (chronic kidney disease) stage 3, GFR 30-59 ml/min (HCC)    Hypertension    Thyroid disease    hypothyroid   Past Surgical History:  Procedure Laterality Date   ABDOMINAL HYSTERECTOMY     CATARACT EXTRACTION     KNEE SURGERY Left    11 surgery    SHOULDER SURGERY Bilateral    Current Outpatient Medications on File Prior to Visit  Medication Sig Dispense Refill   aspirin 81 MG tablet Take 81 mg by mouth daily.     BIOTIN PO Take 1 tablet by mouth daily.     docusate sodium (COLACE) 100 MG capsule Take 200 mg by mouth at bedtime.      hydrochlorothiazide (HYDRODIURIL) 25 MG tablet Take 1 tablet (25 mg total) by mouth daily. 90 tablet 1   levocetirizine (XYZAL) 5 MG tablet Take 1 tablet (5 mg total) by mouth every evening. 90 tablet 1   levothyroxine (SYNTHROID) 88 MCG tablet Take 1 tablet (88 mcg total) by mouth daily. 90 tablet 3   Multiple Vitamins-Minerals (AIRBORNE PO) Take 2 tablets by mouth daily.     valsartan (DIOVAN) 160 MG tablet Take 1 tablet (160 mg total) by mouth daily. Stop losartan 90 tablet 3   No current facility-administered medications on file prior to visit.   Allergies  Allergen Reactions   Erythromycin Base Other (See Comments)   Penicillins    Tylenol [Acetaminophen]    Cortisone Other (See Comments) and Rash    given injections-skin peels/redness   Social History   Socioeconomic History   Marital status: Married    Spouse name: Not on file   Number of  children: Not on file   Years of education: Not on file   Highest education level: Not on file  Occupational History   Not on file  Tobacco Use   Smoking status: Never   Smokeless tobacco: Never  Substance and Sexual Activity   Alcohol use: Not on file   Drug use: Not on file   Sexual activity: Not on file  Other Topics Concern   Not on file  Social History Narrative   Not on file   Social Determinants of Health   Financial Resource Strain: Low Risk    Difficulty of Paying Living Expenses: Not hard at all  Food Insecurity: No Food Insecurity   Worried About Running Out of Food in the Last Year: Never true   Ran Out of Food in the Last Year: Never true  Transportation Needs: No Transportation Needs   Lack of Transportation (Medical): No   Lack of Transportation (Non-Medical): No  Physical Activity: Inactive   Days of Exercise per Week: 0 days   Minutes of Exercise per Session: 0 min  Stress: No Stress Concern Present   Feeling of Stress : Not at all  Social Connections: Socially Integrated   Frequency of Communication with Friends and Family: More  than three times a week   Frequency of Social Gatherings with Friends and Family: More than three times a week   Attends Religious Services: More than 4 times per year   Active Member of [employer] or Organizations: Yes   Attends Engineer, structural: More than 4 times per year   Marital Status: Married  Catering manager Violence: Not At Risk   Fear of Current or Ex-Partner: No   Emotionally Abused: No   Physically Abused: No   Sexually Abused: No      Review of Systems  All other systems reviewed and are negative.     Objective:   Physical Exam Vitals reviewed.  Constitutional:      Appearance: She is well-developed.  HENT:     Nose: Nose normal.  Neck:     Thyroid: No thyromegaly.  Cardiovascular:     Rate and Rhythm: Normal rate and regular rhythm.     Heart sounds: Normal heart sounds.  Pulmonary:      Effort: Pulmonary effort is normal. No respiratory distress.     Breath sounds: Normal breath sounds. No wheezing or rales.  Abdominal:     General: Bowel sounds are normal. There is no distension.     Palpations: Abdomen is soft.     Tenderness: There is no abdominal tenderness. There is no guarding or rebound.  Musculoskeletal:     Cervical back: Neck supple.       Feet:  Lymphadenopathy:     Cervical: No cervical adenopathy.          Assessment & Plan:  Plantar keratosis, acquired I believe this is IPK versus an early plantar wart.  I recommended excision using a punch biopsy technique and then pressure offloading using a callus pad/bunion pad.  Patient prefers to simply tolerate the lesion for the present time.  If she changes her mind she can return at any point for therapy

## 2021-02-08 ENCOUNTER — Encounter: Payer: Self-pay | Admitting: *Deleted

## 2021-04-06 IMAGING — MG DIGITAL SCREENING BILAT W/ TOMO W/ CAD
8 series · 9 of 24 positions shown · non-contrast
Comparison: Previous exam(s).

CLINICAL DATA: Screening.

EXAM:
DIGITAL SCREENING BILATERAL MAMMOGRAM WITH TOMO AND CAD

[L MLO synth-2D]
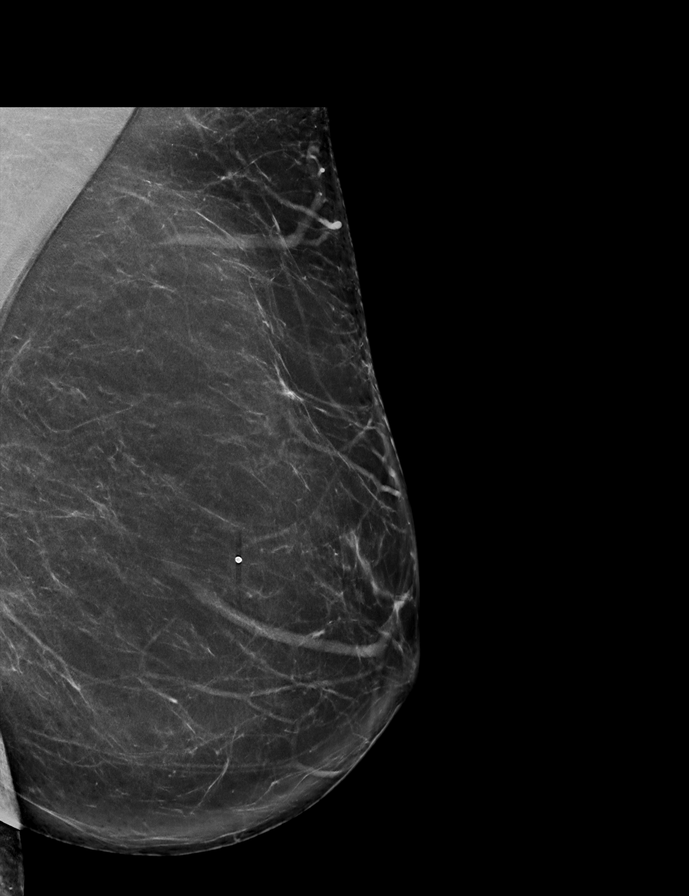

[R CC synth-2D]
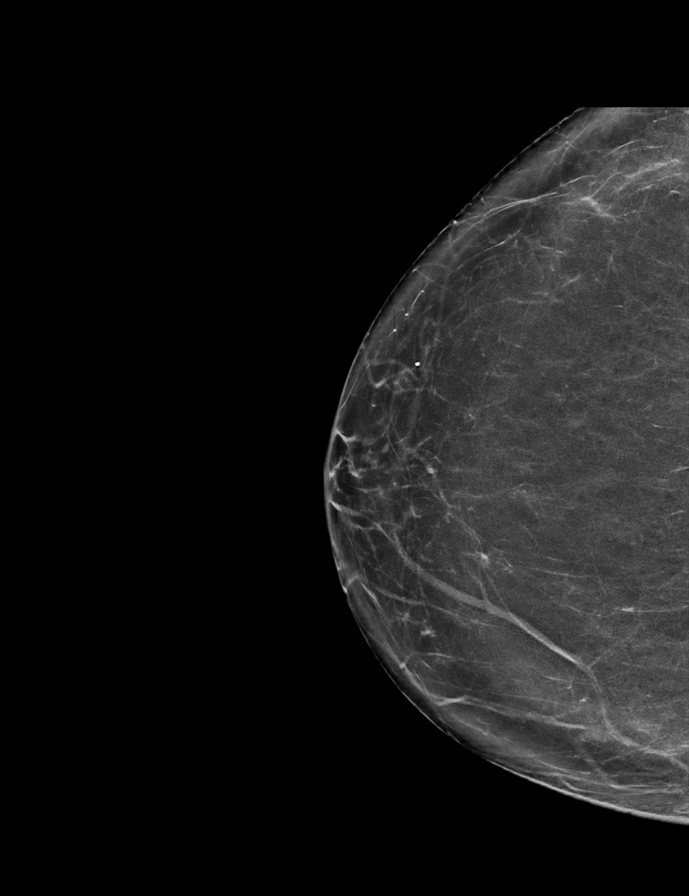

[L CC synth-2D]
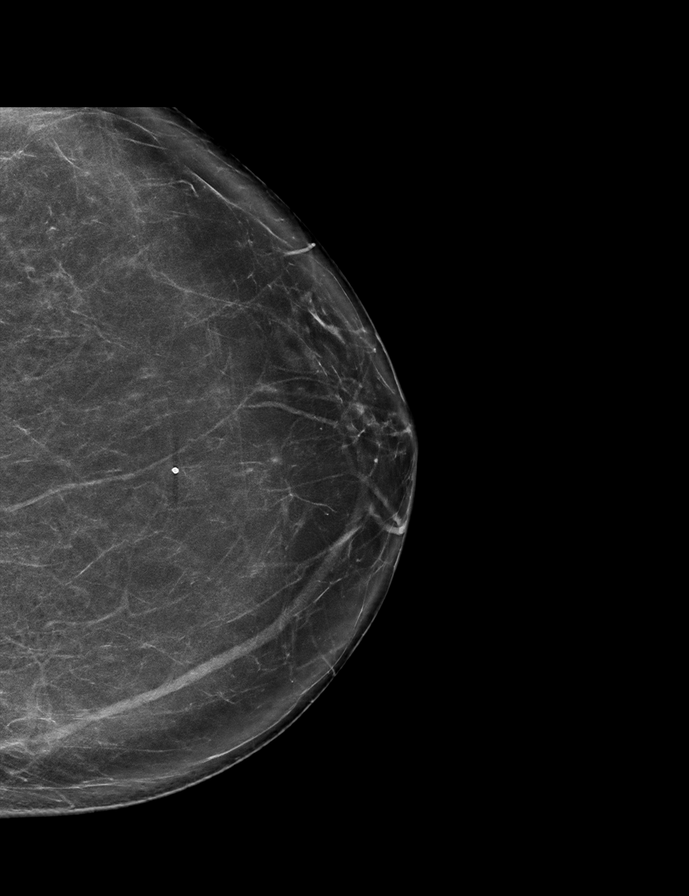

[R MLO synth-2D]
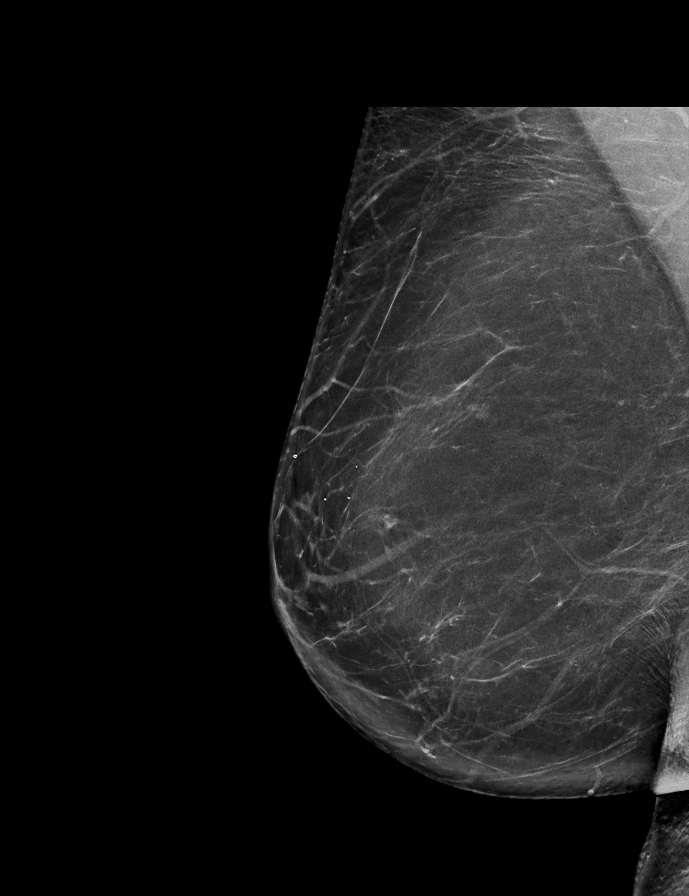

[L MLO tomo · 2 of 85 frames shown]
[frame 28/85]
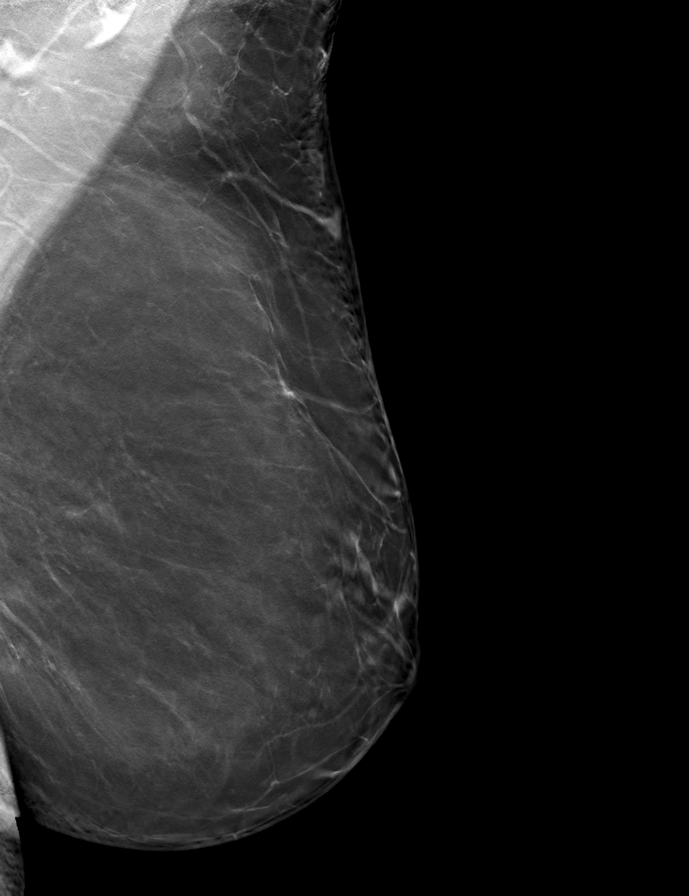
[frame 43/85]
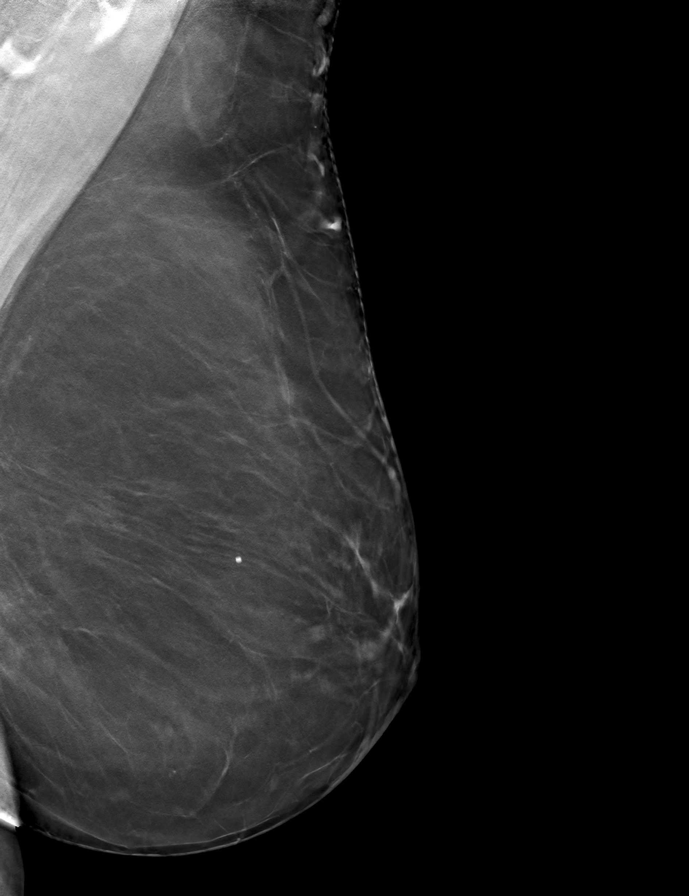

[R CC tomo · tomo slice 35/69.0]
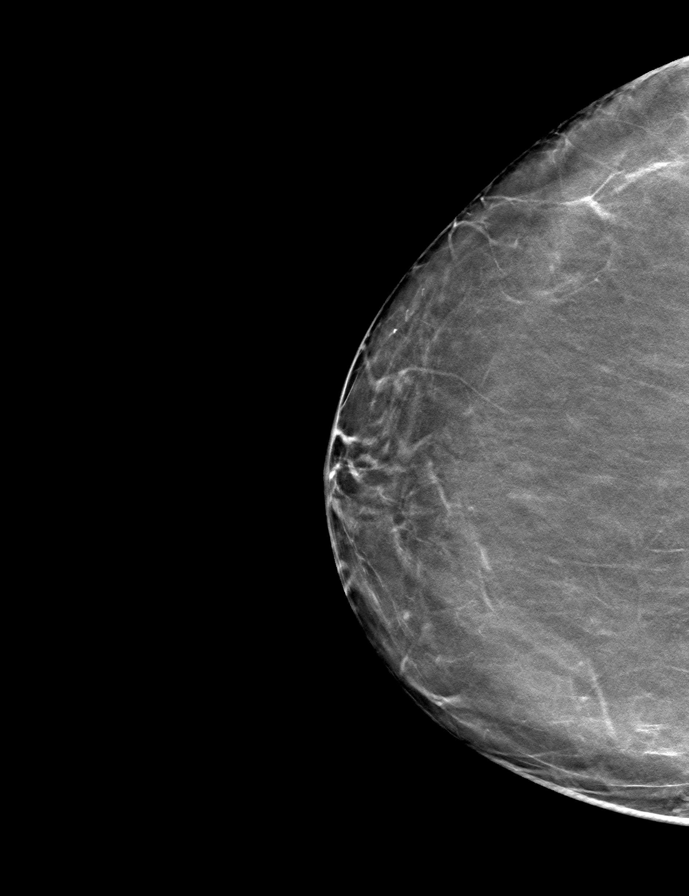

[R MLO tomo · tomo slice 39/78.0]
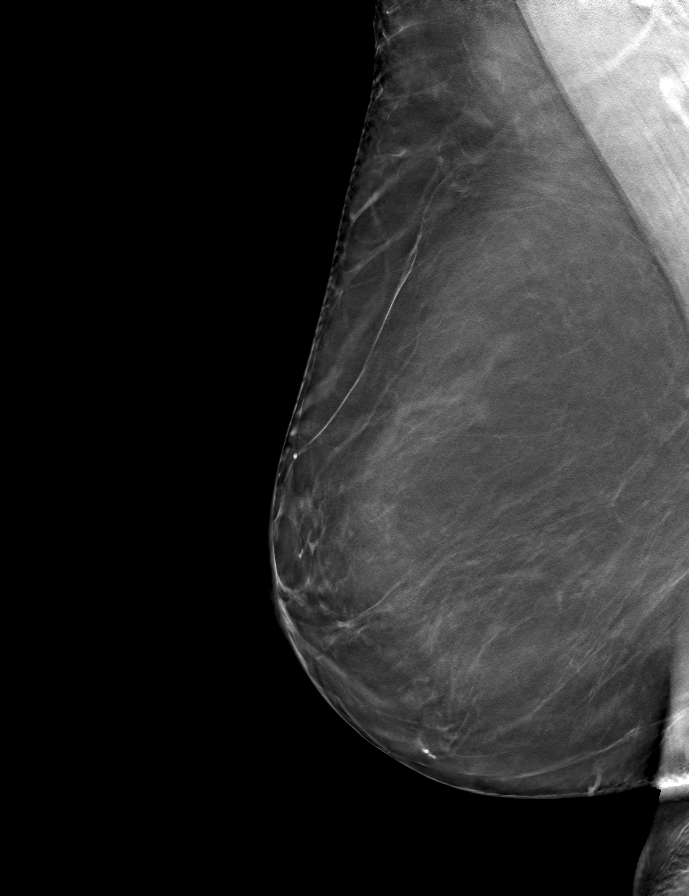

[L CC tomo · tomo slice 37/74.0]
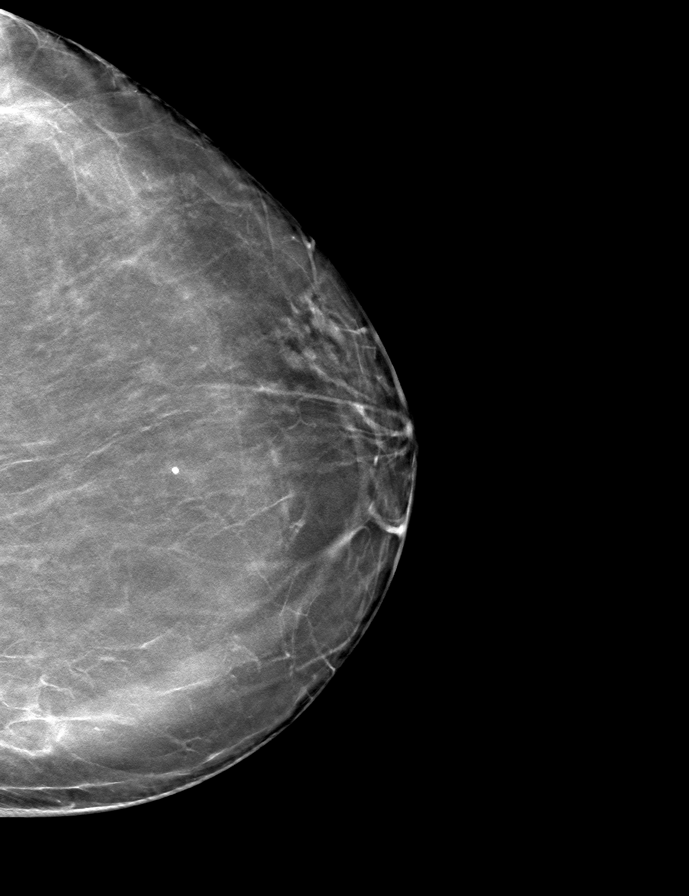

[9 of 24 positions shown; findings below may reference images not displayed]

ACR Breast Density Category b: There are scattered areas of
fibroglandular density.
FINDINGS: There are no findings suspicious for malignancy. Images were
processed with CAD.
IMPRESSION: No mammographic evidence of malignancy. A result letter of this
screening mammogram will be mailed directly to the patient.

RECOMMENDATION:
Screening mammogram in one year. (Code:[id number])

BI-RADS CATEGORY  1: Negative.

## 2021-04-16 ENCOUNTER — Other Ambulatory Visit: Payer: Self-pay | Admitting: Family Medicine

## 2021-04-16 DIAGNOSIS — E039 Hypothyroidism, unspecified: Secondary | ICD-10-CM

## 2021-04-26 ENCOUNTER — Other Ambulatory Visit: Payer: Self-pay | Admitting: Family Medicine

## 2021-04-26 DIAGNOSIS — Z1231 Encounter for screening mammogram for malignant neoplasm of breast: Secondary | ICD-10-CM

## 2021-05-15 ENCOUNTER — Other Ambulatory Visit: Payer: Self-pay | Admitting: Family Medicine

## 2021-05-15 DIAGNOSIS — I10 Essential (primary) hypertension: Secondary | ICD-10-CM

## 2021-05-28 ENCOUNTER — Other Ambulatory Visit: Payer: Self-pay | Admitting: Family Medicine

## 2021-05-31 ENCOUNTER — Ambulatory Visit
Admission: RE | Admit: 2021-05-31 | Discharge: 2021-05-31 | Disposition: A | Discharge status: Home / Self Care | Payer: Medicare HMO | Source: Ambulatory Visit | Attending: Family Medicine | Admitting: Family Medicine

## 2021-05-31 DIAGNOSIS — Z1231 Encounter for screening mammogram for malignant neoplasm of breast: Secondary | ICD-10-CM | POA: Diagnosis not present

## 2021-06-05 ENCOUNTER — Other Ambulatory Visit: Payer: Self-pay

## 2021-06-05 MED ORDER — VALSARTAN 160 MG PO TABS: 160.0000 mg | ORAL_TABLET | Freq: Every day | ORAL | 3 refills | Status: DC

## 2021-06-12 ENCOUNTER — Other Ambulatory Visit: Payer: Self-pay

## 2021-06-12 DIAGNOSIS — N183 Chronic kidney disease, stage 3 unspecified: Secondary | ICD-10-CM

## 2021-06-12 DIAGNOSIS — E785 Hyperlipidemia, unspecified: Secondary | ICD-10-CM

## 2021-06-12 DIAGNOSIS — E039 Hypothyroidism, unspecified: Secondary | ICD-10-CM

## 2021-06-12 DIAGNOSIS — I10 Essential (primary) hypertension: Secondary | ICD-10-CM

## 2021-06-13 ENCOUNTER — Other Ambulatory Visit: Payer: Medicare HMO

## 2021-06-13 ENCOUNTER — Other Ambulatory Visit: Payer: Self-pay

## 2021-06-13 DIAGNOSIS — E785 Hyperlipidemia, unspecified: Secondary | ICD-10-CM | POA: Diagnosis not present

## 2021-06-13 DIAGNOSIS — I10 Essential (primary) hypertension: Secondary | ICD-10-CM

## 2021-06-13 DIAGNOSIS — N183 Chronic kidney disease, stage 3 unspecified: Secondary | ICD-10-CM | POA: Diagnosis not present

## 2021-06-13 DIAGNOSIS — E039 Hypothyroidism, unspecified: Secondary | ICD-10-CM

## 2021-06-14 LAB — CBC WITH DIFFERENTIAL/PLATELET
Absolute Monocytes: 639 cells/uL (ref 200–950)
Basophils Absolute: 69 cells/uL (ref 0–200)
Basophils Relative: 0.9 %
Eosinophils Absolute: 331 cells/uL (ref 15–500)
Eosinophils Relative: 4.3 %
HCT: 43.7 % (ref 35.0–45.0)
Hemoglobin: 14.8 g/dL (ref 11.7–15.5)
Lymphs Abs: 3681 cells/uL (ref 850–3900)
MCH: 31.6 pg (ref 27.0–33.0)
MCHC: 33.9 g/dL (ref 32.0–36.0)
MCV: 93.4 fL (ref 80.0–100.0)
MPV: 10 fL (ref 7.5–12.5)
Monocytes Relative: 8.3 %
Neutro Abs: 2980 cells/uL (ref 1500–7800)
Neutrophils Relative %: 38.7 %
Platelets: 267 10*3/uL (ref 140–400)
RBC: 4.68 10*6/uL (ref 3.80–5.10)
RDW: 11.9 % (ref 11.0–15.0)
Total Lymphocyte: 47.8 %
WBC: 7.7 10*3/uL (ref 3.8–10.8)

## 2021-06-14 LAB — COMPREHENSIVE METABOLIC PANEL
AG Ratio: 1.4 (calc) (ref 1.0–2.5)
ALT: 14 U/L (ref 6–29)
AST: 16 U/L (ref 10–35)
Albumin: 3.9 g/dL (ref 3.6–5.1)
Alkaline phosphatase (APISO): 48 U/L (ref 37–153)
BUN: 15 mg/dL (ref 7–25)
CO2: 28 mmol/L (ref 20–32)
Calcium: 9.4 mg/dL (ref 8.6–10.4)
Chloride: 105 mmol/L (ref 98–110)
Creat: 0.97 mg/dL (ref 0.60–1.00)
Globulin: 2.7 g/dL (calc) (ref 1.9–3.7)
Glucose, Bld: 110 mg/dL — ABNORMAL HIGH (ref 65–99)
Potassium: 3.9 mmol/L (ref 3.5–5.3)
Sodium: 142 mmol/L (ref 135–146)
Total Bilirubin: 0.5 mg/dL (ref 0.2–1.2)
Total Protein: 6.6 g/dL (ref 6.1–8.1)

## 2021-06-14 LAB — LIPID PANEL
Cholesterol: 187 mg/dL (ref <200)
HDL: 52 mg/dL (ref >=50)
LDL Cholesterol (Calc): 117 mg/dL (calc) — ABNORMAL HIGH
Non-HDL Cholesterol (Calc): 135 mg/dL (calc) — ABNORMAL HIGH (ref <130)
Total CHOL/HDL Ratio: 3.6 (calc) (ref <5.0)
Triglycerides: 82 mg/dL (ref <150)

## 2021-06-14 LAB — TSH: TSH: 2.75 mIU/L (ref 0.40–4.50)

## 2021-06-20 ENCOUNTER — Other Ambulatory Visit: Payer: Self-pay

## 2021-06-20 ENCOUNTER — Ambulatory Visit (INDEPENDENT_AMBULATORY_CARE_PROVIDER_SITE_OTHER): Payer: Medicare HMO | Admitting: Family Medicine

## 2021-06-20 ENCOUNTER — Encounter: Payer: Self-pay | Admitting: Family Medicine

## 2021-06-20 VITALS — BP 108/72 | HR 71 | Temp 97.6°F | Ht 61.0 in | Wt 191.0 lb

## 2021-06-20 DIAGNOSIS — I10 Essential (primary) hypertension: Secondary | ICD-10-CM

## 2021-06-20 DIAGNOSIS — E785 Hyperlipidemia, unspecified: Secondary | ICD-10-CM

## 2021-06-20 DIAGNOSIS — E039 Hypothyroidism, unspecified: Secondary | ICD-10-CM

## 2021-06-20 DIAGNOSIS — N183 Chronic kidney disease, stage 3 unspecified: Secondary | ICD-10-CM | POA: Diagnosis not present

## 2021-06-20 MED ORDER — DICLOFENAC SODIUM 75 MG PO TBEC: 75.0000 mg | DELAYED_RELEASE_TABLET | Freq: Two times a day (BID) | ORAL | 0 refills | Status: DC

## 2021-06-20 NOTE — Progress Notes (Signed)
Subjective:    [name], female    DOB: 08/16/1945, 75 y.o.   MRN: [id number]   patient is a very pleasant 75 year old Caucasian female here today for a checkup.  However she is also complaining of pain in her right knee over the medial compartment.  It hurts to stand and walk.  It is a sharp shooting pain at times.  She is allergic to cortisone should she declines a cortisone shot.  She has tried viscosupplementation in the past with no benefit.  She is not currently on any NSAID.  She denies any falls or injuries.  She denies any laxity.  She denies any locking in the knee joint.  Her blood pressure today is excellent.  Her most recent lab work however does show mild elevation in cholesterol and fasting blood sugar.  She admits to dietary indiscretion recently.  Her thyroid test is excellent.  Appointment on 06/13/2021  Component Date Value Ref Range Status   WBC 06/13/2021 7.7  3.8 - 10.8 Thousand/uL Final   RBC 06/13/2021 4.68  3.80 - 5.10 Million/uL Final   Hemoglobin 06/13/2021 14.8  11.7 - 15.5 g/dL Final   HCT 75/64/3329 43.7  35.0 - 45.0 % Final   MCV 06/13/2021 93.4  80.0 - 100.0 fL Final   MCH 06/13/2021 31.6  27.0 - 33.0 pg Final   MCHC 06/13/2021 33.9  32.0 - 36.0 g/dL Final   RDW 51/88/4166 11.9  11.0 - 15.0 % Final   Platelets 06/13/2021 267  140 - 400 Thousand/uL Final   MPV 06/13/2021 10.0  7.5 - 12.5 fL Final   Neutro Abs 06/13/2021 2,980  1,500 - 7,800 cells/uL Final   Lymphs Abs 06/13/2021 3,681  850 - 3,900 cells/uL Final   Absolute Monocytes 06/13/2021 639  200 - 950 cells/uL Final   Eosinophils Absolute 06/13/2021 331  15 - 500 cells/uL Final   Basophils Absolute 06/13/2021 69  0 - 200 cells/uL Final   Neutrophils Relative % 06/13/2021 38.7  % Final   Total Lymphocyte 06/13/2021 47.8  % Final   Monocytes Relative 06/13/2021 8.3  % Final   Eosinophils Relative 06/13/2021 4.3  % Final   Basophils Relative 06/13/2021 0.9  % Final   Glucose, Bld  06/13/2021 110 (H)  65 - 99 mg/dL Final   Comment: .            Fasting reference interval . For someone without known diabetes, a glucose value between 100 and 125 mg/dL is consistent with prediabetes and should be confirmed with a follow-up test. .    BUN 06/13/2021 15  7 - 25 mg/dL Final   Creat 12/28/1599 0.97  0.60 - 1.00 mg/dL Final   BUN/Creatinine Ratio 06/13/2021 NOT APPLICABLE  6 - 22 (calc) Final   Sodium 06/13/2021 142  135 - 146 mmol/L Final   Potassium 06/13/2021 3.9  3.5 - 5.3 mmol/L Final   Chloride 06/13/2021 105  98 - 110 mmol/L Final   CO2 06/13/2021 28  20 - 32 mmol/L Final   Calcium 06/13/2021 9.4  8.6 - 10.4 mg/dL Final   Total Protein 09/32/3557 6.6  6.1 - 8.1 g/dL Final   Albumin 32/20/2542 3.9  3.6 - 5.1 g/dL Final   Globulin 70/62/3762 2.7  1.9 - 3.7 g/dL (calc) Final   AG Ratio 06/13/2021 1.4  1.0 - 2.5 (calc) Final   Total Bilirubin 06/13/2021 0.5  0.2 - 1.2 mg/dL Final   Alkaline phosphatase (APISO) 06/13/2021  48  37 - 153 U/L Final   AST 06/13/2021 16  10 - 35 U/L Final   ALT 06/13/2021 14  6 - 29 U/L Final   Cholesterol 06/13/2021 187  <200 mg/dL Final   HDL 06/13/2021 52  > OR = 50 mg/dL Final   Triglycerides 06/13/2021 82  <150 mg/dL Final   LDL Cholesterol (Calc) 06/13/2021 117 (H)  mg/dL (calc) Final   Comment: Reference range: <100 . Desirable range <100 mg/dL for primary prevention;   <70 mg/dL for patients with CHD or diabetic patients  with > or = 2 CHD risk factors. [place] [facility] LDL-C is now calculated using the Martin-Hopkins  calculation, which is a validated novel method providing  better accuracy than the Friedewald equation in the  estimation of LDL-C.  Cresenciano Genre et al. Annamaria Helling. [id number]): 2061-2068  (http://education.[facility].com/faq/FAQ164)    Total CHOL/HDL Ratio 06/13/2021 3.6  <5.0 (calc) Final   Non-HDL Cholesterol (Calc) 06/13/2021 135 (H)  <130 mg/dL (calc) Final   Comment: For patients with diabetes plus 1 major ASCVD  risk  factor, treating to a non-HDL-C goal of <100 mg/dL  (LDL-C of <70 mg/dL) is considered a therapeutic  option.    TSH 06/13/2021 2.75  0.40 - 4.50 mIU/L Final     Past Medical History:  Diagnosis Date   CKD (chronic kidney disease) stage 3, GFR 30-59 ml/min (HCC)    Hypertension    Thyroid disease    hypothyroid   Past Surgical History:  Procedure Laterality Date   ABDOMINAL HYSTERECTOMY     CATARACT EXTRACTION     KNEE SURGERY Left    11 surgery    SHOULDER SURGERY Bilateral    Current Outpatient Medications on File Prior to Visit  Medication Sig Dispense Refill   aspirin 81 MG tablet Take 81 mg by mouth daily.     BIOTIN PO Take 1 tablet by mouth daily.     docusate sodium (COLACE) 100 MG capsule Take 200 mg by mouth at bedtime.      hydrochlorothiazide (HYDRODIURIL) 25 MG tablet TAKE 1 TABLET (25 MG TOTAL) BY MOUTH DAILY. 90 tablet 1   levocetirizine (XYZAL) 5 MG tablet TAKE 1 TABLET BY MOUTH EVERY DAY IN THE EVENING 90 tablet 1   levothyroxine (SYNTHROID) 88 MCG tablet TAKE 1 TABLET BY MOUTH EVERY DAY 90 tablet 3   Multiple Vitamins-Minerals (AIRBORNE PO) Take 2 tablets by mouth daily.     valsartan (DIOVAN) 160 MG tablet Take 1 tablet (160 mg total) by mouth daily. Stop losartan 90 tablet 3   zolpidem (AMBIEN) 10 MG tablet Take 1 tablet (10 mg total) by mouth at bedtime as needed for sleep. 15 tablet 4   No current facility-administered medications on file prior to visit.   Allergies  Allergen Reactions   Erythromycin Base Other (See Comments)   Penicillins    Tylenol [Acetaminophen]    Cortisone Other (See Comments) and Rash    given injections-skin peels/redness   Social History   Socioeconomic History   Marital status: Married    Spouse name: Not on file   Number of children: Not on file   Years of education: Not on file   Highest education level: Not on file  Occupational History   Not on file  Tobacco Use   Smoking status: Never   Smokeless  tobacco: Never  Substance and Sexual Activity   Alcohol use: Not on file   Drug use: Not on file   Sexual activity:  Not on file  Other Topics Concern   Not on file  Social History Narrative   Not on file   Social Determinants of Health   Financial Resource Strain: Low Risk    Difficulty of Paying Living Expenses: Not hard at all  Food Insecurity: No Food Insecurity   Worried About [place] in the Last Year: Never true   [place] in the Last Year: Never true  Transportation Needs: No Transportation Needs   Lack of Transportation (Medical): No   Lack of Transportation (Non-Medical): No  Physical Activity: Inactive   Days of Exercise per Week: 0 days   Minutes of Exercise per Session: 0 min  Stress: No Stress Concern Present   Feeling of Stress : Not at all  Social Connections: Socially Integrated   Frequency of Communication with Friends and Family: More than three times a week   Frequency of Social Gatherings with Friends and Family: More than three times a week   Attends Religious Services: More than 4 times per year   Active Member of [employer] or Organizations: Yes   Attends Music therapist: More than 4 times per year   Marital Status: Married  Human resources officer Violence: Not At Risk   Fear of Current or Ex-Partner: No   Emotionally Abused: No   Physically Abused: No   Sexually Abused: No      Review of Systems  All other systems reviewed and are negative.     Objective:   Physical Exam Vitals reviewed.  Constitutional:      Appearance: She is well-developed.  HENT:     Nose: Nose normal.  Neck:     Thyroid: No thyromegaly.  Cardiovascular:     Rate and Rhythm: Normal rate and regular rhythm.     Heart sounds: Normal heart sounds.  Pulmonary:     Effort: Pulmonary effort is normal. No respiratory distress.     Breath sounds: Normal breath sounds. No wheezing or rales.  Abdominal:     General: Bowel sounds are normal. There is  no distension.     Palpations: Abdomen is soft.     Tenderness: There is no abdominal tenderness. There is no guarding or rebound.  Musculoskeletal:     Cervical back: Neck supple.     Right knee: Bony tenderness present. Decreased range of motion. Tenderness present over the medial joint line.  Lymphadenopathy:     Cervical: No cervical adenopathy.          Assessment & Plan:  Hypothyroidism, unspecified type  Hyperlipidemia, unspecified hyperlipidemia type  Stage 3 chronic kidney disease, unspecified whether stage 3a or 3b CKD ([place])  Benign essential HTN I am very happy with her blood pressure along with her thyroid.  However her fasting blood sugar and cholesterol could be better.  I encouraged dietary changes and lifestyle changes and weight loss to address this.  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

## 2021-07-24 DIAGNOSIS — E039 Hypothyroidism, unspecified: Secondary | ICD-10-CM | POA: Diagnosis not present

## 2021-07-24 DIAGNOSIS — I10 Essential (primary) hypertension: Secondary | ICD-10-CM | POA: Diagnosis not present

## 2021-07-24 DIAGNOSIS — R051 Acute cough: Secondary | ICD-10-CM | POA: Diagnosis not present

## 2021-07-30 DIAGNOSIS — J019 Acute sinusitis, unspecified: Secondary | ICD-10-CM | POA: Diagnosis not present

## 2021-07-30 DIAGNOSIS — J45909 Unspecified asthma, uncomplicated: Secondary | ICD-10-CM | POA: Diagnosis not present

## 2021-10-15 DIAGNOSIS — M23306 Other meniscus derangements, unspecified meniscus, right knee: Secondary | ICD-10-CM | POA: Diagnosis not present

## 2021-10-15 DIAGNOSIS — M1711 Unilateral primary osteoarthritis, right knee: Secondary | ICD-10-CM | POA: Diagnosis not present

## 2021-10-15 DIAGNOSIS — G8929 Other chronic pain: Secondary | ICD-10-CM | POA: Insufficient documentation

## 2021-10-15 DIAGNOSIS — Z96652 Presence of left artificial knee joint: Secondary | ICD-10-CM | POA: Diagnosis not present

## 2021-10-22 ENCOUNTER — Other Ambulatory Visit: Payer: Self-pay

## 2021-10-22 ENCOUNTER — Other Ambulatory Visit: Payer: Self-pay | Admitting: Family Medicine

## 2021-10-22 DIAGNOSIS — Z01812 Encounter for preprocedural laboratory examination: Secondary | ICD-10-CM | POA: Diagnosis not present

## 2021-10-22 DIAGNOSIS — Z0189 Encounter for other specified special examinations: Secondary | ICD-10-CM | POA: Diagnosis not present

## 2021-10-22 DIAGNOSIS — Z0000 Encounter for general adult medical examination without abnormal findings: Secondary | ICD-10-CM | POA: Diagnosis not present

## 2021-10-22 DIAGNOSIS — I10 Essential (primary) hypertension: Secondary | ICD-10-CM

## 2021-10-30 DIAGNOSIS — Z79899 Other long term (current) drug therapy: Secondary | ICD-10-CM | POA: Diagnosis not present

## 2021-10-30 DIAGNOSIS — Z791 Long term (current) use of non-steroidal anti-inflammatories (NSAID): Secondary | ICD-10-CM | POA: Diagnosis not present

## 2021-10-30 DIAGNOSIS — M25561 Pain in right knee: Secondary | ICD-10-CM | POA: Diagnosis not present

## 2021-10-30 DIAGNOSIS — M1711 Unilateral primary osteoarthritis, right knee: Secondary | ICD-10-CM | POA: Diagnosis not present

## 2021-10-30 DIAGNOSIS — S83281A Other tear of lateral meniscus, current injury, right knee, initial encounter: Secondary | ICD-10-CM | POA: Diagnosis not present

## 2021-10-30 DIAGNOSIS — S83242A Other tear of medial meniscus, current injury, left knee, initial encounter: Secondary | ICD-10-CM | POA: Diagnosis not present

## 2021-10-30 DIAGNOSIS — Z4889 Encounter for other specified surgical aftercare: Secondary | ICD-10-CM | POA: Diagnosis not present

## 2021-10-30 DIAGNOSIS — Z6834 Body mass index (BMI) 34.0-34.9, adult: Secondary | ICD-10-CM | POA: Diagnosis not present

## 2021-10-30 DIAGNOSIS — M94261 Chondromalacia, right knee: Secondary | ICD-10-CM | POA: Diagnosis not present

## 2021-10-30 DIAGNOSIS — S83241A Other tear of medial meniscus, current injury, right knee, initial encounter: Secondary | ICD-10-CM | POA: Diagnosis not present

## 2021-10-30 DIAGNOSIS — I890 Lymphedema, not elsewhere classified: Secondary | ICD-10-CM | POA: Diagnosis not present

## 2021-11-08 DIAGNOSIS — M25561 Pain in right knee: Secondary | ICD-10-CM | POA: Diagnosis not present

## 2021-11-08 DIAGNOSIS — R531 Weakness: Secondary | ICD-10-CM | POA: Diagnosis not present

## 2021-11-08 DIAGNOSIS — R2689 Other abnormalities of gait and mobility: Secondary | ICD-10-CM | POA: Diagnosis not present

## 2021-11-08 DIAGNOSIS — M25661 Stiffness of right knee, not elsewhere classified: Secondary | ICD-10-CM | POA: Diagnosis not present

## 2021-11-11 DIAGNOSIS — R531 Weakness: Secondary | ICD-10-CM | POA: Diagnosis not present

## 2021-11-11 DIAGNOSIS — M25561 Pain in right knee: Secondary | ICD-10-CM | POA: Diagnosis not present

## 2021-11-11 DIAGNOSIS — R2689 Other abnormalities of gait and mobility: Secondary | ICD-10-CM | POA: Diagnosis not present

## 2021-11-11 DIAGNOSIS — M25661 Stiffness of right knee, not elsewhere classified: Secondary | ICD-10-CM | POA: Diagnosis not present

## 2021-11-19 ENCOUNTER — Other Ambulatory Visit: Payer: Self-pay | Admitting: Family Medicine

## 2021-11-19 NOTE — Telephone Encounter (Signed)
Is this okay to refill Last refill on 10/22/21 Last ov 06/20/21 Ov next 12/19/21

## 2021-11-28 ENCOUNTER — Ambulatory Visit: Payer: Medicare HMO

## 2021-11-28 ENCOUNTER — Ambulatory Visit (INDEPENDENT_AMBULATORY_CARE_PROVIDER_SITE_OTHER): Payer: Medicare HMO | Admitting: Family Medicine

## 2021-11-28 VITALS — BP 128/80 | HR 54 | Temp 98.8°F | Ht 61.0 in | Wt 194.0 lb

## 2021-11-28 DIAGNOSIS — R531 Weakness: Secondary | ICD-10-CM | POA: Diagnosis not present

## 2021-11-28 DIAGNOSIS — M25561 Pain in right knee: Secondary | ICD-10-CM | POA: Diagnosis not present

## 2021-11-28 DIAGNOSIS — R053 Chronic cough: Secondary | ICD-10-CM

## 2021-11-28 DIAGNOSIS — R2689 Other abnormalities of gait and mobility: Secondary | ICD-10-CM | POA: Diagnosis not present

## 2021-11-28 DIAGNOSIS — M25661 Stiffness of right knee, not elsewhere classified: Secondary | ICD-10-CM | POA: Diagnosis not present

## 2021-11-28 MED ORDER — MONTELUKAST SODIUM 10 MG PO TABS: 10.0000 mg | ORAL_TABLET | Freq: Every day | ORAL | 3 refills | Status: DC

## 2021-11-28 NOTE — Progress Notes (Signed)
Subjective:    [name], female    DOB: 07-05-1945, 76 y.o.   MRN: [id number]   February, the patient went to an urgent care in [place] and was diagnosed with pneumonia.  She states that the "doctor did not hear anything" however a chest x-ray confirmed pneumonia.  She believes she was started on antibiotics and steroids.  Since that time she has had a constant cough.  The cough is nonproductive.  However at times she will cough so hard that she will cough up green and yellow mucus.  She denies wheezing or chest pain or pleurisy or hemoptysis.  She attributes it to allergies.  She feels like she is dealing with postnasal drip and head congestion.  She denies any sinus pain or sinus pressure or dental pain or headaches.  She denies any heartburn  Past Medical History:  Diagnosis Date   CKD (chronic kidney disease) stage 3, GFR 30-59 ml/min (HCC)    Hypertension    Thyroid disease    hypothyroid   Past Surgical History:  Procedure Laterality Date   ABDOMINAL HYSTERECTOMY     CATARACT EXTRACTION     KNEE SURGERY Left    11 surgery    SHOULDER SURGERY Bilateral    Current Outpatient Medications on File Prior to Visit  Medication Sig Dispense Refill   aspirin 81 MG tablet Take 81 mg by mouth daily.     BIOTIN PO Take 1 tablet by mouth daily.     diclofenac (VOLTAREN) 75 MG EC tablet TAKE 1 TABLET BY MOUTH TWICE A DAY 60 tablet 0   docusate sodium (COLACE) 100 MG capsule Take 200 mg by mouth at bedtime.      fluticasone (FLONASE) 50 MCG/ACT nasal spray Place 2 sprays into both nostrils daily.     hydrochlorothiazide (HYDRODIURIL) 25 MG tablet TAKE 1 TABLET (25 MG TOTAL) BY MOUTH DAILY. 90 tablet 1   levocetirizine (XYZAL) 5 MG tablet TAKE 1 TABLET BY MOUTH EVERY DAY IN THE EVENING 90 tablet 1   levothyroxine (SYNTHROID) 88 MCG tablet TAKE 1 TABLET BY MOUTH EVERY DAY 90 tablet 3   Multiple Vitamins-Minerals (AIRBORNE PO) Take 2 tablets by mouth daily.     valsartan  (DIOVAN) 160 MG tablet Take 1 tablet (160 mg total) by mouth daily. Stop losartan 90 tablet 3   zolpidem (AMBIEN) 10 MG tablet Take 1 tablet (10 mg total) by mouth at bedtime as needed for sleep. 15 tablet 4   No current facility-administered medications on file prior to visit.   Allergies  Allergen Reactions   Erythromycin Base Other (See Comments)   Penicillins    Tylenol [Acetaminophen]    Cortisone Other (See Comments) and Rash    given injections-skin peels/redness   Social History   Socioeconomic History   Marital status: Married    Spouse name: Not on file   Number of children: Not on file   Years of education: Not on file   Highest education level: Not on file  Occupational History   Not on file  Tobacco Use   Smoking status: Never   Smokeless tobacco: Never  Substance and Sexual Activity   Alcohol use: Not on file   Drug use: Not on file   Sexual activity: Not on file  Other Topics Concern   Not on file  Social History Narrative   Not on file   Social Determinants of Health   Financial Resource Strain: Not on file  Food Insecurity: Not on file  Transportation Needs: Not on file  Physical Activity: Not on file  Stress: Not on file  Social Connections: Not on file  Intimate Partner Violence: Not on file      Review of Systems  All other systems reviewed and are negative.     Objective:   Physical Exam Vitals reviewed.  Constitutional:      Appearance: She is well-developed.  HENT:     Nose: Nose normal.  Neck:     Thyroid: No thyromegaly.  Cardiovascular:     Rate and Rhythm: Normal rate and regular rhythm.     Heart sounds: Normal heart sounds.  Pulmonary:     Effort: Pulmonary effort is normal. No respiratory distress.     Breath sounds: Normal breath sounds. No wheezing or rales.  Abdominal:     General: Bowel sounds are normal. There is no distension.     Palpations: Abdomen is soft.     Tenderness: There is no abdominal tenderness.  There is no guarding or rebound.  Musculoskeletal:     Cervical back: Neck supple.  Lymphadenopathy:     Cervical: No cervical adenopathy.          Assessment & Plan:  Chronic cough - Plan: DG Chest 2 View This could be a cough due to postnasal drip and allergies.  Continue Xyzal but add Flonase 2 sprays each nostril daily and if not improving over the next week add Singulair 10 mg daily.  Repeat chest x-ray to ensure that "the abnormality" seen on the previous x-ray has resolved.  Consider laryngal esophageal reflux if not improving versus cough variant asthma

## 2021-11-29 ENCOUNTER — Ambulatory Visit (INDEPENDENT_AMBULATORY_CARE_PROVIDER_SITE_OTHER): Payer: Medicare HMO

## 2021-11-29 ENCOUNTER — Ambulatory Visit
Admission: RE | Admit: 2021-11-29 | Discharge: 2021-11-29 | Disposition: A | Discharge status: Home / Self Care | Payer: Medicare HMO | Source: Ambulatory Visit | Attending: Family Medicine | Admitting: Family Medicine

## 2021-11-29 VITALS — Ht 61.0 in | Wt 194.0 lb

## 2021-11-29 DIAGNOSIS — Z0000 Encounter for general adult medical examination without abnormal findings: Secondary | ICD-10-CM

## 2021-11-29 DIAGNOSIS — R053 Chronic cough: Secondary | ICD-10-CM | POA: Diagnosis not present

## 2021-11-29 NOTE — Patient Instructions (Signed)
Jill [name] , Thank you for taking time to come for your Medicare Wellness Visit. I appreciate your ongoing commitment to your health goals. Please review the following plan we discussed and let me know if I can assist you in the future.   Screening recommendations/referrals: Colonoscopy: Done 01/24/2021. Repeat every 3 years. Mammogram: Done 05/31/2021. Repeat annually  Bone Density: Done 01/08/2017  Recommended yearly ophthalmology/optometry visit for glaucoma screening and checkup Recommended yearly dental visit for hygiene and checkup  Vaccinations: Influenza vaccine: Done 04/21/2021 Repeat annually  Pneumococcal vaccine: Done 08/18/2013 06/25/2016 and 09/22/2007. Tdap vaccine: Due every 10 years. Shingles vaccine: Done 02/09/2020 and 10/25/2019  Covid-19:Done 06/06/2021, 10/01/2020, 04/05/2020, 10/17/2019 and 09/16/2019  Advanced directives: Please bring a copy of your health care power of attorney and living will to the office to be added to your chart at your convenience.   Conditions/risks identified: Aim for 30 minutes of exercise or brisk walking, 6-8 glasses of water, and 5 servings of fruits and vegetables each day.   Next appointment: Follow up in one year for your annual wellness visit 2024.   Preventive Care 5 Years and Older, Female Preventive care refers to lifestyle choices and visits with your health care provider that can promote health and wellness. What does preventive care include? A yearly physical exam. This is also called an annual well check. Dental exams once or twice a year. Routine eye exams. Ask your health care provider how often you should have your eyes checked. Personal lifestyle choices, including: Daily care of your teeth and gums. Regular physical activity. Eating a healthy diet. Avoiding tobacco and drug use. Limiting alcohol use. Practicing safe sex. Taking low-dose aspirin every day. Taking vitamin and mineral supplements as recommended by your  health care provider. What happens during an annual well check? The services and screenings done by your health care provider during your annual well check will depend on your age, overall health, lifestyle risk factors, and family history of disease. Counseling  Your health care provider may ask you questions about your: Alcohol use. Tobacco use. Drug use. Emotional well-being. Home and relationship well-being. Sexual activity. Eating habits. History of falls. Memory and ability to understand (cognition). Work and work Statistician. Reproductive health. Screening  You may have the following tests or measurements: Height, weight, and BMI. Blood pressure. Lipid and cholesterol levels. These may be checked every 5 years, or more frequently if you are over 92 years old. Skin check. Lung cancer screening. You may have this screening every year starting at age 35 if you have a 30-pack-year history of smoking and currently smoke or have quit within the past 15 years. Fecal occult blood test (FOBT) of the stool. You may have this test every year starting at age 31. Flexible sigmoidoscopy or colonoscopy. You may have a sigmoidoscopy every 5 years or a colonoscopy every 10 years starting at age 66. Hepatitis C blood test. Hepatitis B blood test. Sexually transmitted disease (STD) testing. Diabetes screening. This is done by checking your blood sugar (glucose) after you have not eaten for a while (fasting). You may have this done every 1-3 years. Bone density scan. This is done to screen for osteoporosis. You may have this done starting at age 60. Mammogram. This may be done every 1-2 years. Talk to your health care provider about how often you should have regular mammograms. Talk with your health care provider about your test results, treatment options, and if necessary, the need for more tests. Vaccines  Your health care provider may recommend certain vaccines, such as: Influenza vaccine.  This is recommended every year. Tetanus, diphtheria, and acellular pertussis (Tdap, Td) vaccine. You may need a Td booster every 10 years. Zoster vaccine. You may need this after age 21. Pneumococcal 13-valent conjugate (PCV13) vaccine. One dose is recommended after age 57. Pneumococcal polysaccharide (PPSV23) vaccine. One dose is recommended after age 43. Talk to your health care provider about which screenings and vaccines you need and how often you need them. This information is not intended to replace advice given to you by your health care provider. Make sure you discuss any questions you have with your health care provider. Document Released: 07/13/2015 Document Revised: 03/05/2016 Document Reviewed: 04/17/2015 Elsevier Interactive Patient Education  2017 [place] Prevention in the Home Falls can cause injuries. They can happen to people of all ages. There are many things you can do to make your home safe and to help prevent falls. What can I do on the outside of my home? Regularly fix the edges of walkways and driveways and fix any cracks. Remove anything that might make you trip as you walk through a door, such as a raised step or threshold. Trim any bushes or trees on the path to your home. Use bright outdoor lighting. Clear any walking paths of anything that might make someone trip, such as rocks or tools. Regularly check to see if handrails are loose or broken. Make sure that both sides of any steps have handrails. Any raised decks and porches should have guardrails on the edges. Have any leaves, snow, or ice cleared regularly. Use sand or salt on walking paths during winter. Clean up any spills in your garage right away. This includes oil or grease spills. What can I do in the bathroom? Use night lights. Install grab bars by the toilet and in the tub and shower. Do not use towel bars as grab bars. Use non-skid mats or decals in the tub or shower. If you need to sit  down in the shower, use a plastic, non-slip stool. Keep the floor dry. Clean up any water that spills on the floor as soon as it happens. Remove soap buildup in the tub or shower regularly. Attach bath mats securely with double-sided non-slip rug tape. Do not have throw rugs and other things on the floor that can make you trip. What can I do in the bedroom? Use night lights. Make sure that you have a light by your bed that is easy to reach. Do not use any sheets or blankets that are too big for your bed. They should not hang down onto the floor. Have a firm chair that has side arms. You can use this for support while you get dressed. Do not have throw rugs and other things on the floor that can make you trip. What can I do in the kitchen? Clean up any spills right away. Avoid walking on wet floors. Keep items that you use a lot in easy-to-reach places. If you need to reach something above you, use a strong step stool that has a grab bar. Keep electrical cords out of the way. Do not use floor polish or wax that makes floors slippery. If you must use wax, use non-skid floor wax. Do not have throw rugs and other things on the floor that can make you trip. What can I do with my stairs? Do not leave any items on the stairs. Make sure that there are  handrails on both sides of the stairs and use them. Fix handrails that are broken or loose. Make sure that handrails are as long as the stairways. Check any carpeting to make sure that it is firmly attached to the stairs. Fix any carpet that is loose or worn. Avoid having throw rugs at the top or bottom of the stairs. If you do have throw rugs, attach them to the floor with carpet tape. Make sure that you have a light switch at the top of the stairs and the bottom of the stairs. If you do not have them, ask someone to add them for you. What else can I do to help prevent falls? Wear shoes that: Do not have high heels. Have rubber bottoms. Are  comfortable and fit you well. Are closed at the toe. Do not wear sandals. If you use a stepladder: Make sure that it is fully opened. Do not climb a closed stepladder. Make sure that both sides of the stepladder are locked into place. Ask someone to hold it for you, if possible. Clearly mark and make sure that you can see: Any grab bars or handrails. First and last steps. Where the edge of each step is. Use tools that help you move around (mobility aids) if they are needed. These include: Canes. Walkers. Scooters. Crutches. Turn on the lights when you go into a dark area. Replace any light bulbs as soon as they burn out. Set up your furniture so you have a clear path. Avoid moving your furniture around. If any of your floors are uneven, fix them. If there are any pets around you, be aware of where they are. Review your medicines with your doctor. Some medicines can make you feel dizzy. This can increase your chance of falling. Ask your doctor what other things that you can do to help prevent falls. This information is not intended to replace advice given to you by your health care provider. Make sure you discuss any questions you have with your health care provider. Document Released: 04/12/2009 Document Revised: 11/22/2015 Document Reviewed: 07/21/2014 Elsevier Interactive Patient Education  2017 [employer].

## 2021-11-29 NOTE — Progress Notes (Signed)
Subjective:   Jill [name] is a 76 y.o. female who presents for Medicare Annual (Subsequent) preventive examination. Virtual Visit via Telephone Note  I connected with  Brown Human on 11/29/21 at  3:15 PM EDT by telephone and verified that I am speaking with the correct person using two identifiers.  Location: Patient: HOME Provider: [facility] Persons participating in the virtual visit: patient/Nurse Health Advisor   I discussed the limitations, risks, security and privacy concerns of performing an evaluation and management service by telephone and the availability of in person appointments. The patient expressed understanding and agreed to proceed.  Interactive audio and video telecommunications were attempted between this nurse and patient, however failed, due to patient having technical difficulties OR patient did not have access to video capability.  We continued and completed visit with audio only.  Some vital signs may be absent or patient reported.   Chriss Driver, LPN  Review of Systems     Cardiac Risk Factors include: advanced age (>7men, >54 women);hypertension;sedentary lifestyle;obesity (BMI >30kg/m2)     Objective:    Today's Vitals   11/29/21 1520  Weight: 194 lb (88 kg)  Height: 5\' 1"  (1.549 m)   Body mass index is 36.66 kg/m.     11/29/2021    3:31 PM 11/22/2020    8:29 AM 02/14/2016    7:23 AM 05/31/2015    8:02 AM 06/07/2014    9:59 PM  Advanced Directives  Does Patient Have a Medical Advance Directive? Yes Yes No Yes Yes  Type of Paramedic of [place];Living will [place];Living will  [place];Living will Living will;Healthcare Power of Attorney  Does patient want to make changes to medical advance directive?  No - Patient declined  No - Patient declined   Copy of [place] in Chart? No - copy requested No - copy requested  No - copy requested No - copy  requested    Current Medications (verified) Outpatient Encounter Medications as of 11/29/2021  Medication Sig   aspirin 81 MG tablet Take 81 mg by mouth daily.   BIOTIN PO Take 1 tablet by mouth daily.   diclofenac (VOLTAREN) 75 MG EC tablet TAKE 1 TABLET BY MOUTH TWICE A DAY   docusate sodium (COLACE) 100 MG capsule Take 200 mg by mouth at bedtime.    fluticasone (FLONASE) 50 MCG/ACT nasal spray Place 2 sprays into both nostrils daily.   hydrochlorothiazide (HYDRODIURIL) 25 MG tablet TAKE 1 TABLET (25 MG TOTAL) BY MOUTH DAILY.   levocetirizine (XYZAL) 5 MG tablet TAKE 1 TABLET BY MOUTH EVERY DAY IN THE EVENING   levothyroxine (SYNTHROID) 88 MCG tablet TAKE 1 TABLET BY MOUTH EVERY DAY   montelukast (SINGULAIR) 10 MG tablet Take 1 tablet (10 mg total) by mouth at bedtime.   Multiple Vitamins-Minerals (AIRBORNE PO) Take 2 tablets by mouth daily.   valsartan (DIOVAN) 160 MG tablet Take 1 tablet (160 mg total) by mouth daily. Stop losartan   zolpidem (AMBIEN) 10 MG tablet Take 1 tablet (10 mg total) by mouth at bedtime as needed for sleep.   No facility-administered encounter medications on file as of 11/29/2021.    Allergies (verified) Erythromycin base, Penicillins, Tylenol [acetaminophen], and Cortisone   History: Past Medical History:  Diagnosis Date   CKD (chronic kidney disease) stage 3, GFR 30-59 ml/min (HCC)    Hypertension    Thyroid disease    hypothyroid   Past Surgical  History:  Procedure Laterality Date   ABDOMINAL HYSTERECTOMY     CATARACT EXTRACTION     KNEE SURGERY Left    11 surgery    SHOULDER SURGERY Bilateral    Family History  Problem Relation Age of Onset   Heart disease Father    Heart disease Brother    Heart disease Paternal Grandfather    Heart disease Brother    Breast cancer Cousin    Social History   Socioeconomic History   Marital status: Married    Spouse name: Not on file   Number of children: Not on file   Years of education: Not on file    Highest education level: Not on file  Occupational History   Not on file  Tobacco Use   Smoking status: Never   Smokeless tobacco: Never  Substance and Sexual Activity   Alcohol use: Not on file   Drug use: Not on file   Sexual activity: Not on file  Other Topics Concern   Not on file  Social History Narrative   Not on file   Social Determinants of Health   Financial Resource Strain: Low Risk    Difficulty of Paying Living Expenses: Not hard at all  Food Insecurity: No Food Insecurity   Worried About Estate manager/land agent of Food in the Last Year: Never true   [place] in the Last Year: Never true  Transportation Needs: No Transportation Needs   Lack of Transportation (Medical): No   Lack of Transportation (Non-Medical): No  Physical Activity: Inactive   Days of Exercise per Week: 0 days   Minutes of Exercise per Session: 0 min  Stress: No Stress Concern Present   Feeling of Stress : Not at all  Social Connections: Socially Integrated   Frequency of Communication with Friends and Family: More than three times a week   Frequency of Social Gatherings with Friends and Family: More than three times a week   Attends Religious Services: More than 4 times per year   Active Member of [employer] or Organizations: Yes   Attends Music therapist: More than 4 times per year   Marital Status: Married    Tobacco Counseling Counseling given: Not Answered   Clinical Intake:  Pre-visit preparation completed: Yes  Pain : No/denies pain     BMI - recorded: 36.66 Nutritional Status: BMI > 30  Obese Nutritional Risks: None Diabetes: No  How often do you need to have someone help you when you read instructions, pamphlets, or other written materials from your doctor or pharmacy?: 1 - Never  Diabetic?NO  Interpreter Needed?: No  Information entered by :: mj Benjamin Casanas, lpn   Activities of Daily Living    11/29/2021    3:34 PM  In your present state of health, do you  have any difficulty performing the following activities:  Hearing? 0  Vision? 0  Difficulty concentrating or making decisions? 0  Walking or climbing stairs? 0  Dressing or bathing? 0  Doing errands, shopping? 0  Preparing Food and eating ? N  Using the Toilet? N  In the past six months, have you accidently leaked urine? N  Do you have problems with loss of bowel control? N  Managing your Medications? N  Managing your Finances? N  Housekeeping or managing your Housekeeping? N    Patient Care Team: Susy Frizzle, MD as PCP - General (Family Medicine)  Indicate any recent Medical Services you may have received from other  than [facility] providers in the past year (date may be approximate).     Assessment:   This is a routine wellness examination for [facility].  Hearing/Vision screen Hearing Screening - Comments:: No hearing issues. Vision Screening - Comments:: Readers. Dr. Prudencio Burly. 05/2021.  Dietary issues and exercise activities discussed: Current Exercise Habits: The patient does not participate in regular exercise at present, Exercise limited by: cardiac condition(s);orthopedic condition(s)   Goals Addressed             This Visit's Progress    Exercise 3x per week (30 min per time)       Increase exercise as tolerated.  Travel to [place] in Fall 2023.       Depression Screen    11/29/2021    3:27 PM 12/18/2020    8:22 AM 11/22/2020    8:31 AM 06/21/2019    8:01 AM 11/10/2017   11:14 AM 12/27/2014    3:46 PM  PHQ 2/9 Scores  PHQ - 2 Score 0 0 0 0 0 0    Fall Risk    11/29/2021    3:32 PM 12/18/2020    8:22 AM 11/22/2020    8:30 AM 06/21/2019    8:01 AM 11/10/2017   11:14 AM  Fall Risk   Falls in the past year? 1 1 1  0 No  Number falls in past yr: 0 0 0    Injury with Fall? 1 0 0    Risk for fall due to : History of fall(s);Impaired balance/gait;Impaired mobility Impaired balance/gait No Fall Risks    Follow up Falls prevention discussed Falls evaluation  completed Falls evaluation completed;Falls prevention discussed Falls evaluation completed     FALL RISK PREVENTION PERTAINING TO THE HOME:  Any stairs in or around the home? Yes  If so, are there any without handrails? No  Home free of loose throw rugs in walkways, pet beds, electrical cords, etc? Yes  Adequate lighting in your home to reduce risk of falls? Yes   ASSISTIVE DEVICES UTILIZED TO PREVENT FALLS:  Life alert? No  Use of a cane, walker or w/c? No  Grab bars in the bathroom? No  Shower chair or bench in shower? Yes  Elevated toilet seat or a handicapped toilet? Yes   TIMED UP AND GO:  Was the test performed? No .  Phone visit.  Cognitive Function:        11/29/2021    3:34 PM 11/22/2020    8:33 AM  6CIT Screen  What Year? 0 points 0 points  What month? 0 points 0 points  What time? 0 points 0 points  Count back from 20 0 points 0 points  Months in reverse 0 points 0 points  Repeat phrase 0 points 0 points  Total Score 0 points 0 points    Immunizations Immunization History  Administered Date(s) Administered   Fluad Quad(high Dose 65+) 04/21/2021   Influenza, High Dose Seasonal PF 04/06/2017, 04/14/2018, 04/14/2019, 03/15/2020   Influenza,inj,Quad PF,6+ Mos 06/20/2015, 06/25/2016   Influenza,inj,quad, With Preservative 04/14/2018   Influenza-Unspecified 03/30/2017   PFIZER(Purple Top)SARS-COV-2 Vaccination 09/16/2019, 10/07/2019, 04/05/2020, 10/01/2020   Pfizer Covid-19 Vaccine Bivalent Booster 38yrs & up 06/06/2021   Pneumococcal Conjugate-13 08/18/2013   Pneumococcal Polysaccharide-23 09/22/2007, 06/25/2016   Zoster Recombinat (Shingrix) 10/25/2019, 02/09/2020    TDAP status: Due, Education has been provided regarding the importance of this vaccine. Advised may receive this vaccine at local pharmacy or Health Dept. Aware to provide a copy of the vaccination record  if obtained from local pharmacy or Health Dept. Verbalized acceptance and  understanding.  Flu Vaccine status: Up to date  Pneumococcal vaccine status: Up to date  Covid-19 vaccine status: Completed vaccines  Qualifies for Shingles Vaccine? Yes   Zostavax completed Yes   Shingrix Completed?: Yes  Screening Tests Health Maintenance  Topic Date Due   TETANUS/TDAP  06/27/2022 (Originally 05/26/1965)   INFLUENZA VACCINE  01/28/2022   MAMMOGRAM  05/31/2022   Fecal DNA (Cologuard)  01/25/2024   Pneumonia Vaccine 5+ Years old  Completed   DEXA SCAN  Completed   COVID-19 Vaccine  Completed   Hepatitis C Screening  Completed   Zoster Vaccines- Shingrix  Completed   HPV VACCINES  Aged Out    Health Maintenance  There are no preventive care reminders to display for this patient.   Colorectal cancer screening: Type of screening: Cologuard. Completed 01/24/2021. Repeat every 3 years  Mammogram status: Completed 05/31/2021. Repeat every year  Bone Density status: Completed 01/08/2017. Results reflect: Bone density results: NORMAL. Repeat every 2 years.  Lung Cancer Screening: (Low Dose CT Chest recommended if Age 59-80 years, 30 pack-year currently smoking OR have quit w/in 15years.) does not qualify.   Additional Screening:  Hepatitis C Screening: does qualify; Completed 01/06/2017  Vision Screening: Recommended annual ophthalmology exams for early detection of glaucoma and other disorders of the eye. Is the patient up to date with their annual eye exam?  Yes  Who is the provider or what is the name of the office in which the patient attends annual eye exams? Dr. Prudencio Burly  If pt is not established with a provider, would they like to be referred to a provider to establish care? No .   Dental Screening: Recommended annual dental exams for proper oral hygiene  Community Resource Referral / Chronic Care Management: CRR required this visit?  No   CCM required this visit?  No      Plan:     I have personally reviewed and noted the following in the  patient's chart:   Medical and social history Use of alcohol, tobacco or illicit drugs  Current medications and supplements including opioid prescriptions.  Functional ability and status Nutritional status Physical activity Advanced directives List of other physicians Hospitalizations, surgeries, and ER visits in previous 12 months Vitals Screenings to include cognitive, depression, and falls Referrals and appointments  In addition, I have reviewed and discussed with patient certain preventive protocols, quality metrics, and best practice recommendations. A written personalized care plan for preventive services as well as general preventive health recommendations were provided to patient.     Chriss Driver, LPN   [id number]   Nurse Notes: Pt is up to date on health maintenance and vaccines.

## 2021-12-02 DIAGNOSIS — R531 Weakness: Secondary | ICD-10-CM | POA: Diagnosis not present

## 2021-12-02 DIAGNOSIS — M25661 Stiffness of right knee, not elsewhere classified: Secondary | ICD-10-CM | POA: Diagnosis not present

## 2021-12-02 DIAGNOSIS — M25561 Pain in right knee: Secondary | ICD-10-CM | POA: Diagnosis not present

## 2021-12-02 DIAGNOSIS — R2689 Other abnormalities of gait and mobility: Secondary | ICD-10-CM | POA: Diagnosis not present

## 2021-12-03 DIAGNOSIS — Z4789 Encounter for other orthopedic aftercare: Secondary | ICD-10-CM | POA: Diagnosis not present

## 2021-12-03 DIAGNOSIS — M25561 Pain in right knee: Secondary | ICD-10-CM | POA: Diagnosis not present

## 2021-12-10 ENCOUNTER — Encounter (HOSPITAL_BASED_OUTPATIENT_CLINIC_OR_DEPARTMENT_OTHER): Payer: Self-pay

## 2021-12-10 ENCOUNTER — Other Ambulatory Visit: Payer: Self-pay

## 2021-12-10 DIAGNOSIS — J069 Acute upper respiratory infection, unspecified: Secondary | ICD-10-CM | POA: Diagnosis not present

## 2021-12-10 DIAGNOSIS — Z20822 Contact with and (suspected) exposure to covid-19: Secondary | ICD-10-CM | POA: Insufficient documentation

## 2021-12-10 DIAGNOSIS — Z7982 Long term (current) use of aspirin: Secondary | ICD-10-CM | POA: Diagnosis not present

## 2021-12-10 DIAGNOSIS — B9789 Other viral agents as the cause of diseases classified elsewhere: Secondary | ICD-10-CM | POA: Diagnosis not present

## 2021-12-10 DIAGNOSIS — R059 Cough, unspecified: Secondary | ICD-10-CM | POA: Diagnosis not present

## 2021-12-10 DIAGNOSIS — Z79899 Other long term (current) drug therapy: Secondary | ICD-10-CM | POA: Insufficient documentation

## 2021-12-10 LAB — RESP PANEL BY RT-PCR (FLU A&B, COVID) ARPGX2
Influenza A by PCR: NEGATIVE
Influenza B by PCR: NEGATIVE
SARS Coronavirus 2 by RT PCR: NEGATIVE

## 2021-12-10 NOTE — ED Triage Notes (Signed)
Patient here POV from Home.  Endorses having Chills that began today. Also endorses Cough that has been present since February. PCP had XRAY Imaging completed which was Unremarkable. Also endorses Bilateral Knee Pain). No Recent Trauma or Injury.   Subjective Fever. Negative Home COVID-19 Test.   NAD Noted during Triage. A&Ox4. GCS 15. BIB Wheelchair.

## 2021-12-11 ENCOUNTER — Emergency Department (HOSPITAL_BASED_OUTPATIENT_CLINIC_OR_DEPARTMENT_OTHER): Payer: Medicare HMO

## 2021-12-11 ENCOUNTER — Emergency Department (HOSPITAL_BASED_OUTPATIENT_CLINIC_OR_DEPARTMENT_OTHER)
Admission: EM | Admit: 2021-12-11 | Discharge: 2021-12-11 | Disposition: A | Discharge status: Home / Self Care | Payer: Medicare HMO | Attending: Emergency Medicine | Admitting: Emergency Medicine

## 2021-12-11 DIAGNOSIS — J069 Acute upper respiratory infection, unspecified: Secondary | ICD-10-CM

## 2021-12-11 DIAGNOSIS — R059 Cough, unspecified: Secondary | ICD-10-CM | POA: Diagnosis not present

## 2021-12-11 DIAGNOSIS — R0602 Shortness of breath: Secondary | ICD-10-CM | POA: Diagnosis not present

## 2021-12-11 MED ORDER — BENZONATATE 100 MG PO CAPS: 100.0000 mg | ORAL_CAPSULE | Freq: Three times a day (TID) | ORAL | 0 refills | Status: DC

## 2021-12-11 MED ORDER — AZITHROMYCIN 250 MG PO TABS: 250.0000 mg | ORAL_TABLET | Freq: Every day | ORAL | 0 refills | Status: DC

## 2021-12-11 NOTE — ED Provider Notes (Signed)
[facility] EMERGENCY DEPT Provider Note   CSN: [id number] Arrival date & time: 12/10/21  2133     History  Chief Complaint  Patient presents with   Chills    Tonita PhoenixMargaret L [name] is a 76 y.o. female.  76 yo F with a chief complaints of subjective chills and cough.  The patient had been having issues with this for months now.  She has seen her family doctor for this is also been seen in [place].  She thinks symptoms got worse over the past couple days.  She is been in close contact with her daughter who has a similar cough.  Has had paroxysms of cough.        Home Medications Prior to Admission medications   Medication Sig Start Date End Date Taking? Authorizing Provider  azithromycin (ZITHROMAX) 250 MG tablet Take 1 tablet (250 mg total) by mouth daily. Take first 2 tablets together, then 1 every day until finished. 12/11/21  Yes Melene PlanFloyd, Anju Sereno, DO  benzonatate (TESSALON) 100 MG capsule Take 1 capsule (100 mg total) by mouth every 8 (eight) hours. 12/11/21  Yes Melene PlanFloyd, Morning Halberg, DO  aspirin 81 MG tablet Take 81 mg by mouth daily.    [provider]  BIOTIN PO Take 1 tablet by mouth daily.    [provider]  diclofenac (VOLTAREN) 75 MG EC tablet TAKE 1 TABLET BY MOUTH TWICE A DAY 11/19/21   Donita BrooksPickard, Warren T, MD  docusate sodium (COLACE) 100 MG capsule Take 200 mg by mouth at bedtime.     [provider]  fluticasone (FLONASE) 50 MCG/ACT nasal spray Place 2 sprays into both nostrils daily. 07/30/21   [provider]  hydrochlorothiazide (HYDRODIURIL) 25 MG tablet TAKE 1 TABLET (25 MG TOTAL) BY MOUTH DAILY. 10/22/21   Donita BrooksPickard, Warren T, MD  levocetirizine (XYZAL) 5 MG tablet TAKE 1 TABLET BY MOUTH EVERY DAY IN THE EVENING 10/22/21   Donita BrooksPickard, Warren T, MD  levothyroxine (SYNTHROID) 88 MCG tablet TAKE 1 TABLET BY MOUTH EVERY DAY 04/16/21   Donita BrooksPickard, Warren T, MD  montelukast (SINGULAIR) 10 MG tablet Take 1 tablet (10 mg total) by mouth at  bedtime. 11/28/21   Donita BrooksPickard, Warren T, MD  Multiple Vitamins-Minerals (AIRBORNE PO) Take 2 tablets by mouth daily.    [provider]  valsartan (DIOVAN) 160 MG tablet Take 1 tablet (160 mg total) by mouth daily. Stop losartan 06/05/21   Donita BrooksPickard, Warren T, MD  zolpidem (AMBIEN) 10 MG tablet Take 1 tablet (10 mg total) by mouth at bedtime as needed for sleep. 02/05/21 03/07/21  Donita BrooksPickard, Warren T, MD      Allergies    Erythromycin base, Penicillins, Tylenol [acetaminophen], and Cortisone    Review of Systems   Review of Systems  Physical Exam Updated Vital Signs BP (!) 135/53   Pulse 80   Temp 100.3 F (37.9 C) (Oral)   Resp 17   Ht 5\' 1"  (1.549 m)   Wt 88 kg   SpO2 97%   BMI 36.66 kg/m  Physical Exam Vitals and nursing note reviewed.  Constitutional:      General: She is not in acute distress.    Appearance: She is well-developed. She is not diaphoretic.  HENT:     Head: Normocephalic and atraumatic.  Eyes:     Pupils: Pupils are equal, round, and reactive to light.  Cardiovascular:     Rate and Rhythm: Normal rate and regular rhythm.     Heart sounds:  No murmur heard.    No friction rub. No gallop.  Pulmonary:     Effort: Pulmonary effort is normal.     Breath sounds: No wheezing or rales.  Abdominal:     General: There is no distension.     Palpations: Abdomen is soft.     Tenderness: There is no abdominal tenderness.  Musculoskeletal:        General: No tenderness.     Cervical back: Normal range of motion and neck supple.  Skin:    General: Skin is warm and dry.  Neurological:     Mental Status: She is alert and oriented to person, place, and time.  Psychiatric:        Behavior: Behavior normal.     ED Results / Procedures / Treatments   Labs (all labs ordered are listed, but only abnormal results are displayed) Labs Reviewed  RESP PANEL BY RT-PCR (FLU A&B, COVID) [id number]  CULTURE, BLOOD (ROUTINE X 2)  CULTURE, BLOOD (ROUTINE X 2)     EKG None  Radiology DG Chest Port 1 View  Result Date: 12/11/2021 CLINICAL DATA:  Cough, shortness of breath EXAM: PORTABLE CHEST 1 VIEW COMPARISON:  11/29/2021 FINDINGS: Normal heart and mediastinal contours. No confluent opacities or effusions. No acute bony abnormality. IMPRESSION: No active disease. Electronically Signed   By: Charlett Nose M.D.   On: 12/11/2021 01:05    Procedures Procedures    Medications Ordered in ED Medications - No data to display  ED Course/ Medical Decision Making/ A&P                           Medical Decision Making Amount and/or Complexity of Data Reviewed Radiology: ordered.  Risk Prescription drug management.   76 yo F with a chief complaint of a cough.  Most likely by history the patient is come down with a viral syndrome after being exposed to her daughter this past weekend.  This could be a ongoing cough from what she had been experiencing, could be pertussis based on history and paroxysms of cough.  With her having ongoing fevers off and on will send off blood cultures.  She is otherwise well-appearing and nontoxic.  COVID and flu testing are negative.  Chest x-ray independently interpreted by me without focal infiltrate.  Will treat with azithromycin for possible pertussis.  Tessalon Perles.  PCP follow-up.  2:31 AM:  I have discussed the diagnosis/risks/treatment options with the patient and family.  Evaluation and diagnostic testing in the emergency department does not suggest an emergent condition requiring admission or immediate intervention beyond what has been performed at this time.  They will follow up with  PCP. We also discussed returning to the ED immediately if new or worsening sx occur. We discussed the sx which are most concerning (e.g., sudden worsening pain, fever, inability to tolerate by mouth) that necessitate immediate return. Medications administered to the patient during their visit and any new prescriptions provided to the  patient are listed below.  Medications given during this visit Medications - No data to display   The patient appears reasonably screen and/or stabilized for discharge and I doubt any other medical condition or other [facility] requiring further screening, evaluation, or treatment in the ED at this time prior to discharge.          Final Clinical Impression(s) / ED Diagnoses Final diagnoses:  Viral URI with cough    Rx / DC Orders  ED Discharge Orders          Ordered    benzonatate (TESSALON) 100 MG capsule  Every 8 hours        12/11/21 0125    azithromycin (ZITHROMAX) 250 MG tablet  Daily        12/11/21 0125              Melene Plan, DO 12/11/21 0488

## 2021-12-11 NOTE — ED Notes (Signed)
Pt verbalizes understanding of discharge instructions. Opportunity for questioning and answers were provided. Pt discharged from ED to home with husband.    

## 2021-12-11 NOTE — Discharge Instructions (Signed)
Your x-ray did not show an obvious pneumonia.  I am going to start you on a Z-Pak which sometimes helps with pertussis.  I prescribed you cough medicine as well.  Please call your family doctor in the morning.

## 2021-12-12 ENCOUNTER — Other Ambulatory Visit: Payer: Medicare HMO

## 2021-12-12 ENCOUNTER — Other Ambulatory Visit: Payer: Self-pay

## 2021-12-12 DIAGNOSIS — E039 Hypothyroidism, unspecified: Secondary | ICD-10-CM

## 2021-12-12 DIAGNOSIS — E785 Hyperlipidemia, unspecified: Secondary | ICD-10-CM

## 2021-12-12 DIAGNOSIS — I10 Essential (primary) hypertension: Secondary | ICD-10-CM

## 2021-12-16 DIAGNOSIS — I10 Essential (primary) hypertension: Secondary | ICD-10-CM | POA: Diagnosis not present

## 2021-12-16 DIAGNOSIS — E039 Hypothyroidism, unspecified: Secondary | ICD-10-CM | POA: Diagnosis not present

## 2021-12-16 DIAGNOSIS — Z79891 Long term (current) use of opiate analgesic: Secondary | ICD-10-CM | POA: Diagnosis not present

## 2021-12-16 DIAGNOSIS — Z7982 Long term (current) use of aspirin: Secondary | ICD-10-CM | POA: Diagnosis not present

## 2021-12-16 DIAGNOSIS — M1990 Unspecified osteoarthritis, unspecified site: Secondary | ICD-10-CM | POA: Diagnosis not present

## 2021-12-16 DIAGNOSIS — Z6836 Body mass index (BMI) 36.0-36.9, adult: Secondary | ICD-10-CM | POA: Diagnosis not present

## 2021-12-16 DIAGNOSIS — Z8249 Family history of ischemic heart disease and other diseases of the circulatory system: Secondary | ICD-10-CM | POA: Diagnosis not present

## 2021-12-16 DIAGNOSIS — J309 Allergic rhinitis, unspecified: Secondary | ICD-10-CM | POA: Diagnosis not present

## 2021-12-16 LAB — CULTURE, BLOOD (ROUTINE X 2)
Culture: NO GROWTH
Culture: NO GROWTH
Special Requests: ADEQUATE
Special Requests: ADEQUATE

## 2021-12-19 ENCOUNTER — Ambulatory Visit (INDEPENDENT_AMBULATORY_CARE_PROVIDER_SITE_OTHER): Payer: Medicare HMO | Admitting: Family Medicine

## 2021-12-19 VITALS — BP 112/64 | HR 66 | Temp 98.0°F | Ht 61.0 in | Wt 189.0 lb

## 2021-12-19 DIAGNOSIS — R053 Chronic cough: Secondary | ICD-10-CM

## 2021-12-19 DIAGNOSIS — E039 Hypothyroidism, unspecified: Secondary | ICD-10-CM

## 2021-12-19 DIAGNOSIS — I10 Essential (primary) hypertension: Secondary | ICD-10-CM | POA: Diagnosis not present

## 2021-12-19 DIAGNOSIS — E785 Hyperlipidemia, unspecified: Secondary | ICD-10-CM

## 2021-12-19 MED ORDER — PANTOPRAZOLE SODIUM 40 MG PO TBEC: 40.0000 mg | DELAYED_RELEASE_TABLET | Freq: Every day | ORAL | 3 refills | Status: DC

## 2021-12-19 NOTE — Progress Notes (Signed)
Subjective:    [name], female    DOB: Feb 07, 1946, 76 y.o.   MRN: [id number]  Patient continues to suffer with a chronic cough.  She states she will occasionally bring up green phlegm.  She went to an emergency room and they repeated a chest x-ray.  She had 2 negative chest x-rays in the month of June.  She denies any sinus drainage or head congestion or rhinorrhea.  She denies any acid reflux.  She denies any wheezing.  She has no history of smoking exposure.  She has no history of asthma.  Patient works in Scientist, research (medical).  She was not around dust or asbestos.  She denies any hemoptysis.  Recently she been having dizziness which sounds like orthostatic hypotension although I cannot rule out vertigo. Past Medical History:  Diagnosis Date   CKD (chronic kidney disease) stage 3, GFR 30-59 ml/min (HCC)    Hypertension    Thyroid disease    hypothyroid   Past Surgical History:  Procedure Laterality Date   ABDOMINAL HYSTERECTOMY     CATARACT EXTRACTION     KNEE SURGERY Left    11 surgery    SHOULDER SURGERY Bilateral    Current Outpatient Medications on File Prior to Visit  Medication Sig Dispense Refill   aspirin 81 MG tablet Take 81 mg by mouth daily.     azithromycin (ZITHROMAX) 250 MG tablet Take 1 tablet (250 mg total) by mouth daily. Take first 2 tablets together, then 1 every day until finished. 6 tablet 0   benzonatate (TESSALON) 100 MG capsule Take 1 capsule (100 mg total) by mouth every 8 (eight) hours. 21 capsule 0   BIOTIN PO Take 1 tablet by mouth daily.     diclofenac (VOLTAREN) 75 MG EC tablet TAKE 1 TABLET BY MOUTH TWICE A DAY 60 tablet 0   docusate sodium (COLACE) 100 MG capsule Take 200 mg by mouth at bedtime.      fluticasone (FLONASE) 50 MCG/ACT nasal spray Place 2 sprays into both nostrils daily.     hydrochlorothiazide (HYDRODIURIL) 25 MG tablet TAKE 1 TABLET (25 MG TOTAL) BY MOUTH DAILY. 90 tablet 1   levocetirizine (XYZAL) 5 MG tablet TAKE 1 TABLET BY  MOUTH EVERY DAY IN THE EVENING 90 tablet 1   levothyroxine (SYNTHROID) 88 MCG tablet TAKE 1 TABLET BY MOUTH EVERY DAY 90 tablet 3   montelukast (SINGULAIR) 10 MG tablet Take 1 tablet (10 mg total) by mouth at bedtime. 30 tablet 3   Multiple Vitamins-Minerals (AIRBORNE PO) Take 2 tablets by mouth daily.     valsartan (DIOVAN) 160 MG tablet Take 1 tablet (160 mg total) by mouth daily. Stop losartan 90 tablet 3   zolpidem (AMBIEN) 10 MG tablet Take 1 tablet (10 mg total) by mouth at bedtime as needed for sleep. 15 tablet 4   No current facility-administered medications on file prior to visit.   Allergies  Allergen Reactions   Erythromycin Base Other (See Comments)   Penicillins    Tylenol [Acetaminophen]    Cortisone Other (See Comments) and Rash    given injections-skin peels/redness   Social History   Socioeconomic History   Marital status: Married    Spouse name: Not on file   Number of children: Not on file   Years of education: Not on file   Highest education level: Not on file  Occupational History   Not on file  Tobacco Use   Smoking status: Never  Smokeless tobacco: Never  Substance and Sexual Activity   Alcohol use: Never   Drug use: Never   Sexual activity: Not on file  Other Topics Concern   Not on file  Social History Narrative   Not on file   Social Determinants of Health   Financial Resource Strain: Low Risk  (11/29/2021)   Overall Financial Resource Strain (CARDIA)    Difficulty of Paying Living Expenses: Not hard at all  Food Insecurity: No Food Insecurity (11/29/2021)   Hunger Vital Sign    Worried About Running Out of Food in the Last Year: Never true    Ran Out of Food in the Last Year: Never true  Transportation Needs: No Transportation Needs (11/29/2021)   PRAPARE - Administrator, Civil Service (Medical): No    Lack of Transportation (Non-Medical): No  Physical Activity: Inactive (11/29/2021)   Exercise Vital Sign    Days of Exercise per  Week: 0 days    Minutes of Exercise per Session: 0 min  Stress: No Stress Concern Present (11/29/2021)   [employer] of Occupational Health - Occupational Stress Questionnaire    Feeling of Stress : Not at all  Social Connections: Socially Integrated (11/29/2021)   Social Connection and Isolation Panel [NHANES]    Frequency of Communication with Friends and Family: More than three times a week    Frequency of Social Gatherings with Friends and Family: More than three times a week    Attends Religious Services: More than 4 times per year    Active Member of [employer] or Organizations: Yes    Attends Banker Meetings: More than 4 times per year    Marital Status: Married  Catering manager Violence: Not At Risk (11/29/2021)   Humiliation, Afraid, Rape, and Kick questionnaire    Fear of Current or Ex-Partner: No    Emotionally Abused: No    Physically Abused: No    Sexually Abused: No      Review of Systems  All other systems reviewed and are negative.     Objective:   Physical Exam Vitals reviewed.  Constitutional:      Appearance: She is well-developed.  HENT:     Nose: Nose normal.  Neck:     Thyroid: No thyromegaly.  Cardiovascular:     Rate and Rhythm: Normal rate and regular rhythm.     Heart sounds: Normal heart sounds.  Pulmonary:     Effort: Pulmonary effort is normal. No respiratory distress.     Breath sounds: Normal breath sounds. No wheezing or rales.  Abdominal:     General: Bowel sounds are normal. There is no distension.     Palpations: Abdomen is soft.     Tenderness: There is no abdominal tenderness. There is no guarding or rebound.  Musculoskeletal:     Cervical back: Neck supple.  Lymphadenopathy:     Cervical: No cervical adenopathy.          Assessment & Plan:  Hypothyroidism, unspecified type - Plan: CBC with Differential/Platelet, Lipid panel, COMPLETE METABOLIC PANEL WITH GFR, TSH  Benign essential HTN - Plan: CBC with  Differential/Platelet, Lipid panel, COMPLETE METABOLIC PANEL WITH GFR, TSH  Hyperlipidemia, unspecified hyperlipidemia type - Plan: CBC with Differential/Platelet, Lipid panel, COMPLETE METABOLIC PANEL WITH GFR, TSH  Chronic cough Given the chronic cough I will consult pulmonology however I suspect laryngal esophageal reflux.  I recommended trying Protonix 40 mg daily.  She can continue her allergy medicine.  I  recommended that she elevate the head of her bed 2 to 3 inches.  If the cough is improving, we can cancel pulmonology referral.  Blood pressure is excellent.  Discontinue valsartan temporarily due to orthostatic hypotension.  Check CBC CMP lipid panel and TSH

## 2021-12-20 DIAGNOSIS — R531 Weakness: Secondary | ICD-10-CM | POA: Diagnosis not present

## 2021-12-20 DIAGNOSIS — M25561 Pain in right knee: Secondary | ICD-10-CM | POA: Diagnosis not present

## 2021-12-20 DIAGNOSIS — R2689 Other abnormalities of gait and mobility: Secondary | ICD-10-CM | POA: Diagnosis not present

## 2021-12-20 DIAGNOSIS — M25661 Stiffness of right knee, not elsewhere classified: Secondary | ICD-10-CM | POA: Diagnosis not present

## 2021-12-20 LAB — COMPLETE METABOLIC PANEL WITH GFR
AG Ratio: 1.3 (calc) (ref 1.0–2.5)
ALT: 12 U/L (ref 6–29)
AST: 17 U/L (ref 10–35)
Albumin: 3.8 g/dL (ref 3.6–5.1)
Alkaline phosphatase (APISO): 52 U/L (ref 37–153)
BUN/Creatinine Ratio: 14 (calc) (ref 6–22)
BUN: 16 mg/dL (ref 7–25)
CO2: 26 mmol/L (ref 20–32)
Calcium: 9.6 mg/dL (ref 8.6–10.4)
Chloride: 102 mmol/L (ref 98–110)
Creat: 1.18 mg/dL — ABNORMAL HIGH (ref 0.60–1.00)
Globulin: 2.9 g/dL (calc) (ref 1.9–3.7)
Glucose, Bld: 107 mg/dL — ABNORMAL HIGH (ref 65–99)
Potassium: 4.4 mmol/L (ref 3.5–5.3)
Sodium: 140 mmol/L (ref 135–146)
Total Bilirubin: 0.5 mg/dL (ref 0.2–1.2)
Total Protein: 6.7 g/dL (ref 6.1–8.1)
eGFR: 48 mL/min/{1.73_m2} — ABNORMAL LOW (ref >=60)

## 2021-12-20 LAB — CBC WITH DIFFERENTIAL/PLATELET
Absolute Monocytes: 509 cells/uL (ref 200–950)
Basophils Absolute: 61 cells/uL (ref 0–200)
Basophils Relative: 0.8 %
Eosinophils Absolute: 281 cells/uL (ref 15–500)
Eosinophils Relative: 3.7 %
HCT: 40.5 % (ref 35.0–45.0)
Hemoglobin: 13.8 g/dL (ref 11.7–15.5)
Lymphs Abs: 2652 cells/uL (ref 850–3900)
MCH: 31.2 pg (ref 27.0–33.0)
MCHC: 34.1 g/dL (ref 32.0–36.0)
MCV: 91.4 fL (ref 80.0–100.0)
MPV: 10.3 fL (ref 7.5–12.5)
Monocytes Relative: 6.7 %
Neutro Abs: 4096 cells/uL (ref 1500–7800)
Neutrophils Relative %: 53.9 %
Platelets: 314 10*3/uL (ref 140–400)
RBC: 4.43 10*6/uL (ref 3.80–5.10)
RDW: 11.6 % (ref 11.0–15.0)
Total Lymphocyte: 34.9 %
WBC: 7.6 10*3/uL (ref 3.8–10.8)

## 2021-12-20 LAB — LIPID PANEL
Cholesterol: 163 mg/dL (ref <200)
HDL: 44 mg/dL — ABNORMAL LOW (ref >=50)
LDL Cholesterol (Calc): 100 mg/dL (calc) — ABNORMAL HIGH
Non-HDL Cholesterol (Calc): 119 mg/dL (calc) (ref <130)
Total CHOL/HDL Ratio: 3.7 (calc) (ref <5.0)
Triglycerides: 99 mg/dL (ref <150)

## 2021-12-20 LAB — TSH: TSH: 1.45 mIU/L (ref 0.40–4.50)

## 2021-12-23 DIAGNOSIS — R2689 Other abnormalities of gait and mobility: Secondary | ICD-10-CM | POA: Diagnosis not present

## 2021-12-23 DIAGNOSIS — R531 Weakness: Secondary | ICD-10-CM | POA: Diagnosis not present

## 2021-12-23 DIAGNOSIS — M25661 Stiffness of right knee, not elsewhere classified: Secondary | ICD-10-CM | POA: Diagnosis not present

## 2021-12-23 DIAGNOSIS — M25561 Pain in right knee: Secondary | ICD-10-CM | POA: Diagnosis not present

## 2021-12-25 ENCOUNTER — Other Ambulatory Visit: Payer: Self-pay

## 2021-12-25 DIAGNOSIS — R2689 Other abnormalities of gait and mobility: Secondary | ICD-10-CM | POA: Diagnosis not present

## 2021-12-25 DIAGNOSIS — M25661 Stiffness of right knee, not elsewhere classified: Secondary | ICD-10-CM | POA: Diagnosis not present

## 2021-12-25 DIAGNOSIS — R531 Weakness: Secondary | ICD-10-CM | POA: Diagnosis not present

## 2021-12-25 DIAGNOSIS — M25561 Pain in right knee: Secondary | ICD-10-CM | POA: Diagnosis not present

## 2021-12-25 NOTE — Telephone Encounter (Signed)
LOV 06/20/2021 Last refill 02/05/21, #15, 4 refills  Please review, thanks!

## 2021-12-26 ENCOUNTER — Telehealth: Payer: Medicare HMO | Admitting: Physician Assistant

## 2021-12-26 ENCOUNTER — Encounter (HOSPITAL_COMMUNITY): Payer: Self-pay | Admitting: Emergency Medicine

## 2021-12-26 ENCOUNTER — Ambulatory Visit (HOSPITAL_COMMUNITY)
Admission: EM | Admit: 2021-12-26 | Discharge: 2021-12-26 | Disposition: A | Discharge status: Home / Self Care | Payer: Medicare HMO | Attending: Internal Medicine | Admitting: Internal Medicine

## 2021-12-26 DIAGNOSIS — R102 Pelvic and perineal pain: Secondary | ICD-10-CM

## 2021-12-26 DIAGNOSIS — N3001 Acute cystitis with hematuria: Secondary | ICD-10-CM

## 2021-12-26 DIAGNOSIS — R300 Dysuria: Secondary | ICD-10-CM

## 2021-12-26 LAB — POCT URINALYSIS DIPSTICK, ED / UC
Bilirubin Urine: NEGATIVE
Glucose, UA: NEGATIVE mg/dL
Ketones, ur: NEGATIVE mg/dL
Nitrite: POSITIVE — AB
Protein, ur: 100 mg/dL — AB
Specific Gravity, Urine: 1.02 (ref 1.005–1.030)
Urobilinogen, UA: 0.2 mg/dL (ref 0.0–1.0)
pH: 5 (ref 5.0–8.0)

## 2021-12-26 MED ORDER — ZOLPIDEM TARTRATE 10 MG PO TABS: 10.0000 mg | ORAL_TABLET | Freq: Every evening | ORAL | 4 refills | Status: DC | PRN

## 2021-12-26 MED ORDER — CEFDINIR 300 MG PO CAPS: 300.0000 mg | ORAL_CAPSULE | Freq: Two times a day (BID) | ORAL | 0 refills | Status: AC

## 2021-12-26 NOTE — ED Triage Notes (Signed)
Pt is present today with concerns for UTI. Pt sx include dysuria and urinary frequency. Pt sx started x2 days ago

## 2021-12-26 NOTE — Discharge Instructions (Addendum)
Increase oral fluid intake Please take medications as prescribed We will call you with recommendations if labs are abnormal Return to urgent care if symptoms worsen.

## 2021-12-26 NOTE — Progress Notes (Signed)
Because of concern more so for UTI and comment of vaginal pain, coupled with risk of more complicated UTI (age > 35) needing a urine culture, I feel your condition warrants further evaluation and I recommend that you be seen in a face to face visit.   NOTE: There will be NO CHARGE for this eVisit   If you are having a true medical emergency please call 911.      For an urgent face to face visit, [facility] has seven urgent care centers for your convenience:     [facility] Center at [facility] Directions [phone] [address] [place], [place] [address]    [facility] Health Urgent Care Center [facility]) Get Driving Directions [phone] [address] [place], [place] [address]  [facility]) Get Driving Directions [phone] [address] [place],  [place]  [address]  [facility]gent Care Center [facility] - at [employer] Directions  [phone] [address] [place],  [place] [address]   [facility] Health Urgent Care at [facility] Get Driving Directions [phone] [address], [address] [place], [place] [address]   [facility] Health Urgent Care at [facility] Get Driving Directions  [phone] [address] [place], [place] [address]   [facility] Health Urgent Care at [facility] Directions [phone] [address], [address] [place], [place] [address]  Your MyChart E-visit questionnaire answers were reviewed by a board certified advanced clinical practitioner to complete your personal care plan based on your specific symptoms.  Thank you for using e-Visits.

## 2021-12-27 NOTE — ED Provider Notes (Signed)
[facility]    CSN: [id number] Arrival date & time: 12/26/21  1758      History   Chief Complaint Chief Complaint  Patient presents with   Dysuria   Urinary Frequency    HPI Jill [name] is a 76 y.o. female comes to the urgent care with 2 day history of painful urination, urinary frequency and urgency of urination.  Patient's symptoms started yesterday with urinary frequency and urgency of urination but this morning she noticed that it was painful when she urinated herself visit to the urgent care to be evaluated.  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      Home Medications    Prior to Admission medications   Medication Sig Start Date End Date Taking? Authorizing Provider  cefdinir (OMNICEF) 300 MG capsule Take 1 capsule (300 mg total) by mouth 2 (two) times daily for 5 days. 12/26/21 12/31/21 Yes Kirsten Spearing, Britta Mccreedy, MD  aspirin 81 MG tablet Take 81 mg by mouth daily.    [provider]  azithromycin (ZITHROMAX) 250 MG tablet Take 1 tablet (250 mg total) by mouth daily.  Take first 2 tablets together, then 1 every day until finished. 12/11/21   Melene Plan, DO  benzonatate (TESSALON) 100 MG capsule Take 1 capsule (100 mg total) by mouth every 8 (eight) hours. 12/11/21   Melene Plan, DO  BIOTIN PO Take 1 tablet by mouth daily.    [provider]  diclofenac (VOLTAREN) 75 MG EC tablet TAKE 1 TABLET BY MOUTH TWICE A DAY 11/19/21   Donita Brooks, MD  docusate sodium (COLACE) 100 MG capsule Take 200 mg by mouth at bedtime.     [provider]  fluticasone (FLONASE) 50 MCG/ACT nasal spray Place 2 sprays into both nostrils daily. 07/30/21   [provider]  hydrochlorothiazide (HYDRODIURIL) 25 MG tablet TAKE 1 TABLET (25 MG TOTAL) BY MOUTH DAILY. 10/22/21   Donita Brooks, MD  levocetirizine (XYZAL) 5 MG tablet TAKE 1 TABLET BY MOUTH EVERY DAY IN THE EVENING 10/22/21   Donita Brooks, MD  levothyroxine (SYNTHROID) 88 MCG tablet TAKE 1 TABLET BY MOUTH EVERY DAY 04/16/21   Donita Brooks, MD  montelukast (SINGULAIR) 10 MG tablet Take 1 tablet (10 mg total) by mouth at bedtime. 11/28/21   Donita Brooks, MD  Multiple Vitamins-Minerals (AIRBORNE PO) Take 2 tablets by mouth daily.    [provider]  pantoprazole (PROTONIX) 40 MG tablet Take 1 tablet (40 mg total)  by mouth daily. 12/19/21   Donita Brooks, MD  valsartan (DIOVAN) 160 MG tablet Take 1 tablet (160 mg total) by mouth daily. Stop losartan 06/05/21   Donita Brooks, MD  zolpidem (AMBIEN) 10 MG tablet Take 1 tablet (10 mg total) by mouth at bedtime as needed for sleep. 12/26/21 03/11/22  Donita Brooks, MD    Family History Family History  Problem Relation Age of Onset   Heart disease Father    Heart disease Brother    Heart disease Paternal Grandfather    Heart disease Brother    Breast cancer Cousin     Social History Social History   Tobacco Use   Smoking status: Never   Smokeless tobacco: Never  Substance Use Topics   Alcohol use: Never   Drug use:  Never     Allergies   Erythromycin base, Penicillins, Tylenol [acetaminophen], and Cortisone   Review of Systems Review of Systems  Constitutional: Negative.   Gastrointestinal: Negative.   Genitourinary:  Positive for dysuria, frequency and urgency. Negative for genital sores, hematuria and vaginal discharge.     Physical Exam Triage Vital Signs ED Triage Vitals  Enc Vitals Group     BP 12/26/21 1948 (!) 144/80     Pulse Rate 12/26/21 1948 74     Resp 12/26/21 1948 17     Temp 12/26/21 1948 98 F (36.7 C)     Temp src --      SpO2 12/26/21 1948 94 %     Weight --      Height --      Head Circumference --      Peak Flow --      Pain Score 12/26/21 1947 0     Pain Loc --      Pain Edu? --      Excl. in GC? --    No data found.  Updated Vital Signs BP (!) 144/80   Pulse 74   Temp 98 F (36.7 C)   Resp 17   SpO2 94%   Visual Acuity Right Eye Distance:   Left Eye Distance:   Bilateral Distance:    Right Eye Near:   Left Eye Near:    Bilateral Near:     Physical Exam Vitals and nursing note reviewed.  Constitutional:      Appearance: Normal appearance.  Cardiovascular:     Rate and Rhythm: Normal rate and regular rhythm.     Pulses: Normal pulses.     Heart sounds: Normal heart sounds.  Pulmonary:     Effort: Pulmonary effort is normal.     Breath sounds: Normal breath sounds.  Abdominal:     General: Bowel sounds are normal.     Palpations: Abdomen is soft.  Musculoskeletal:        General: Normal range of motion.  Neurological:     Mental Status: She is alert.      UC Treatments / Results  Labs (all labs ordered are listed, but only abnormal results are displayed) Labs Reviewed  POCT URINALYSIS DIPSTICK, ED / UC - Abnormal; Notable for the following components:      Result Value   Hgb urine dipstick LARGE (*)    Protein, ur 100 (*)    Nitrite POSITIVE (*)    Leukocytes,Ua SMALL (*)    All other components within normal limits  URINE  CULTURE    EKG   Radiology No results found.  Procedures Procedures (including critical care  time)  Medications Ordered in UC Medications - No data to display  Initial Impression / Assessment and Plan / UC Course  I have reviewed the triage vital signs and the nursing notes.  Pertinent labs & imaging results that were available during my care of the patient were reviewed by me and considered in my medical decision making (see chart for details).     1.  Acute cystitis with hematuria: Point-of-care urinalysis is positive for leukocyte Estrace, nitrite and hemoglobin Urine cultures have been sent Cefdinir 300 mg twice daily for 5 days Patient is advised to increase oral fluid intake Tylenol Motrin as needed for pain We will call patient with recommendations if labs are abnormal Return precautions given. Final Clinical Impressions(s) / UC Diagnoses   Final diagnoses:  Acute cystitis with hematuria     Discharge Instructions      Increase oral fluid intake Please take medications as prescribed We will call you with recommendations if labs are abnormal Return to urgent care if symptoms worsen.   ED Prescriptions     Medication Sig Dispense Auth. Provider   cefdinir (OMNICEF) 300 MG capsule Take 1 capsule (300 mg total) by mouth 2 (two) times daily for 5 days. 10 capsule Davontay Watlington, Myrene Galas, MD      PDMP not reviewed this encounter.   Chase Picket, MD 12/27/21 [phone]

## 2021-12-29 LAB — URINE CULTURE: Culture: >=100000 — AB

## 2021-12-30 DIAGNOSIS — R531 Weakness: Secondary | ICD-10-CM | POA: Diagnosis not present

## 2021-12-30 DIAGNOSIS — R2689 Other abnormalities of gait and mobility: Secondary | ICD-10-CM | POA: Diagnosis not present

## 2021-12-30 DIAGNOSIS — M25561 Pain in right knee: Secondary | ICD-10-CM | POA: Diagnosis not present

## 2021-12-30 DIAGNOSIS — M25661 Stiffness of right knee, not elsewhere classified: Secondary | ICD-10-CM | POA: Diagnosis not present

## 2022-01-01 DIAGNOSIS — R531 Weakness: Secondary | ICD-10-CM | POA: Diagnosis not present

## 2022-01-01 DIAGNOSIS — M25561 Pain in right knee: Secondary | ICD-10-CM | POA: Diagnosis not present

## 2022-01-01 DIAGNOSIS — M25661 Stiffness of right knee, not elsewhere classified: Secondary | ICD-10-CM | POA: Diagnosis not present

## 2022-01-01 DIAGNOSIS — R2689 Other abnormalities of gait and mobility: Secondary | ICD-10-CM | POA: Diagnosis not present

## 2022-01-03 ENCOUNTER — Telehealth: Payer: Self-pay

## 2022-01-03 NOTE — Telephone Encounter (Signed)
Pt called stating that since pt has stopped taking the Valsartan about 2weeks ago, pt notice that she has been having light headaches and b/p's has been running around 144-150's.  Ie this morning is was t 144/93. Please advice

## 2022-01-06 ENCOUNTER — Other Ambulatory Visit: Payer: Self-pay

## 2022-01-06 DIAGNOSIS — R531 Weakness: Secondary | ICD-10-CM | POA: Diagnosis not present

## 2022-01-06 DIAGNOSIS — M25561 Pain in right knee: Secondary | ICD-10-CM | POA: Diagnosis not present

## 2022-01-06 DIAGNOSIS — M25661 Stiffness of right knee, not elsewhere classified: Secondary | ICD-10-CM | POA: Diagnosis not present

## 2022-01-06 DIAGNOSIS — R2689 Other abnormalities of gait and mobility: Secondary | ICD-10-CM | POA: Diagnosis not present

## 2022-01-06 MED ORDER — VALSARTAN 80 MG PO TABS: 80.0000 mg | ORAL_TABLET | Freq: Every day | ORAL | 3 refills | Status: DC

## 2022-01-06 NOTE — Telephone Encounter (Signed)
Pt aware of Rx sent to pharmacy

## 2022-01-09 DIAGNOSIS — R531 Weakness: Secondary | ICD-10-CM | POA: Diagnosis not present

## 2022-01-09 DIAGNOSIS — M25561 Pain in right knee: Secondary | ICD-10-CM | POA: Diagnosis not present

## 2022-01-09 DIAGNOSIS — M25661 Stiffness of right knee, not elsewhere classified: Secondary | ICD-10-CM | POA: Diagnosis not present

## 2022-01-09 DIAGNOSIS — R2689 Other abnormalities of gait and mobility: Secondary | ICD-10-CM | POA: Diagnosis not present

## 2022-01-13 DIAGNOSIS — M25661 Stiffness of right knee, not elsewhere classified: Secondary | ICD-10-CM | POA: Diagnosis not present

## 2022-01-13 DIAGNOSIS — M25561 Pain in right knee: Secondary | ICD-10-CM | POA: Diagnosis not present

## 2022-01-13 DIAGNOSIS — R2689 Other abnormalities of gait and mobility: Secondary | ICD-10-CM | POA: Diagnosis not present

## 2022-01-13 DIAGNOSIS — R531 Weakness: Secondary | ICD-10-CM | POA: Diagnosis not present

## 2022-01-14 DIAGNOSIS — Z4789 Encounter for other orthopedic aftercare: Secondary | ICD-10-CM | POA: Diagnosis not present

## 2022-01-14 DIAGNOSIS — M1711 Unilateral primary osteoarthritis, right knee: Secondary | ICD-10-CM | POA: Diagnosis not present

## 2022-01-23 ENCOUNTER — Other Ambulatory Visit: Payer: Self-pay

## 2022-01-23 DIAGNOSIS — E039 Hypothyroidism, unspecified: Secondary | ICD-10-CM

## 2022-01-23 MED ORDER — LEVOTHYROXINE SODIUM 88 MCG PO TABS: 88.0000 ug | ORAL_TABLET | Freq: Every day | ORAL | 3 refills | Status: DC

## 2022-01-24 ENCOUNTER — Telehealth: Payer: Self-pay

## 2022-01-24 DIAGNOSIS — E039 Hypothyroidism, unspecified: Secondary | ICD-10-CM

## 2022-01-24 MED ORDER — LEVOTHYROXINE SODIUM 88 MCG PO TABS: 88.0000 ug | ORAL_TABLET | Freq: Every day | ORAL | 3 refills | Status: DC

## 2022-01-24 NOTE — Telephone Encounter (Signed)
Pharmacy faxed a refill request for levothyroxine (SYNTHROID) 88 MCG tablet [[id number]]    Order Details Dose: 88 mcg Route: Oral Frequency: Daily  Dispense Quantity: 90 tablet Refills: 3        Sig: Take 1 tablet (88 mcg total) by mouth daily.       Start Date: 01/23/22 End Date: --  Written Date: 01/23/22 Expiration Date: 01/23/23

## 2022-02-03 DIAGNOSIS — H40012 Open angle with borderline findings, low risk, left eye: Secondary | ICD-10-CM | POA: Diagnosis not present

## 2022-02-03 DIAGNOSIS — H26491 Other secondary cataract, right eye: Secondary | ICD-10-CM | POA: Diagnosis not present

## 2022-02-03 DIAGNOSIS — Z961 Presence of intraocular lens: Secondary | ICD-10-CM | POA: Diagnosis not present

## 2022-02-03 DIAGNOSIS — H524 Presbyopia: Secondary | ICD-10-CM | POA: Diagnosis not present

## 2022-02-06 ENCOUNTER — Ambulatory Visit: Payer: Medicare HMO | Admitting: Family Medicine

## 2022-02-18 ENCOUNTER — Institutional Professional Consult (permissible substitution): Payer: Medicare HMO | Admitting: Pulmonary Disease

## 2022-02-24 ENCOUNTER — Other Ambulatory Visit: Payer: Self-pay | Admitting: Family Medicine

## 2022-02-25 ENCOUNTER — Other Ambulatory Visit: Payer: Self-pay | Admitting: Family Medicine

## 2022-02-25 DIAGNOSIS — I10 Essential (primary) hypertension: Secondary | ICD-10-CM

## 2022-02-25 NOTE — Telephone Encounter (Signed)
Requested medication (s) are due for refill today: no  Requested medication (s) are on the active medication list: yes  Last refill:  11/28/21  Future visit scheduled: yes  Notes to clinic:  pharmacy requesting 90 day refill   Requested Prescriptions  Pending Prescriptions Disp Refills   montelukast (SINGULAIR) 10 MG tablet [Pharmacy Med Name: MONTELUKAST SOD 10 MG TABLET] 90 tablet 1    Sig: TAKE 1 TABLET BY MOUTH EVERYDAY AT BEDTIME     Pulmonology:  Leukotriene Inhibitors Passed - 02/24/2022  2:47 AM      Passed - Valid encounter within last 12 months    Recent Outpatient Visits           2 months ago Chronic cough   [facility] Medicine Donita Brooks, MD   8 months ago Hypothyroidism, unspecified type   [facility] Pickard, Priscille Heidelberg, MD   1 year ago Plantar keratosis, acquired   [facility] Donita Brooks, MD   1 year ago Benign essential HTN   [facility] Tanya Nones, Priscille Heidelberg, MD   1 year ago Benign essential HTN   [facility] Pickard, Priscille Heidelberg, MD       Future Appointments             In 3 months Pickard, Priscille Heidelberg, MD [facility], [facility]

## 2022-02-26 ENCOUNTER — Other Ambulatory Visit: Payer: Self-pay

## 2022-02-26 NOTE — Telephone Encounter (Signed)
[facility] called and spoke to [place], Pharmacy [facility]ican about the refill(s) levocetirizine 5 mg requested. Advised it was sent on 10/22/21 #90/1 refill(s). She says the last refill was on 02/15/22, so no more on file. Pharmacy faxed a request.

## 2022-02-26 NOTE — Telephone Encounter (Signed)
Pharmacy faxed a refill request for levocetirizine (XYZAL) 5 MG tablet [[id number]]    Order Details Dose, Route, Frequency: As Directed  Dispense Quantity: 90 tablet Refills: 1        Sig: TAKE 1 TABLET BY MOUTH EVERY DAY IN THE EVENING       Start Date: 10/22/21 End Date: --  Written Date: 10/22/21 Expiration Date: 10/22/22  Original Order:  levocetirizine (XYZAL) 5 MG tablet [[id number]]   

## 2022-02-26 NOTE — Telephone Encounter (Signed)
Requested Prescriptions  Pending Prescriptions Disp Refills  . hydrochlorothiazide (HYDRODIURIL) 25 MG tablet [Pharmacy Med Name: HYDROCHLOROTHIAZIDE 25 MG TAB] 90 tablet 0    Sig: TAKE 1 TABLET (25 MG TOTAL) BY MOUTH DAILY.     Cardiovascular: Diuretics - Thiazide Failed - 02/25/2022  5:45 PM      Failed - Cr in normal range and within 180 days    Creat  Date Value Ref Range Status  12/19/2021 1.18 (H) 0.60 - 1.00 mg/dL Final         Failed - Last BP in normal range    BP Readings from Last 1 Encounters:  12/26/21 (!) 144/80         Failed - Valid encounter within last 6 months    Recent Outpatient Visits          3 months ago Chronic cough   [facility] Tanya Nones, Priscille Heidelberg, MD   8 months ago Hypothyroidism, unspecified type   [facility] Pickard, Priscille Heidelberg, MD   1 year ago Plantar keratosis, acquired   [facility] Pickard, Priscille Heidelberg, MD   1 year ago Benign essential HTN   [facility] Tanya Nones, Priscille Heidelberg, MD   1 year ago Benign essential HTN   [facility] Donita Brooks, MD      Future Appointments            In 3 months Pickard, Priscille Heidelberg, MD [facility], [facility]           Passed - K in normal range and within 180 days    Potassium  Date Value Ref Range Status  12/19/2021 4.4 3.5 - 5.3 mmol/L Final         Passed - Na in normal range and within 180 days    Sodium  Date Value Ref Range Status  12/19/2021 140 135 - 146 mmol/L Final

## 2022-02-27 NOTE — Telephone Encounter (Signed)
last RF 10/22/21 #90 1 RF  Requested Prescriptions  Refused Prescriptions Disp Refills  . levocetirizine (XYZAL) 5 MG tablet 90 tablet 1    Sig: every evening.     Ear, Nose, and Throat:  Antihistamines - levocetirizine dihydrochloride Failed - 02/26/2022  2:49 PM      Failed - Cr in normal range and within 360 days    Creat  Date Value Ref Range Status  12/19/2021 1.18 (H) 0.60 - 1.00 mg/dL Final         Passed - eGFR is 10 or above and within 360 days    GFR, Est African American  Date Value Ref Range Status  12/11/2020 64 > OR = 60 mL/min/1.31m Final   GFR, Est Non African American  Date Value Ref Range Status  12/11/2020 55 (L) > OR = 60 mL/min/1.751mFinal   eGFR  Date Value Ref Range Status  12/19/2021 48 (L) > OR = 60 mL/min/1.7370minal    Comment:    The eGFR is based on the CKD-EPI 2021 equation. To calculate  the new eGFR from a previous Creatinine or Cystatin C result, go to https://www.kidney.org/professionals/ kdoqi/gfr%5Fcalculator          Passed - Valid encounter within last 12 months    Recent Outpatient Visits          3 months ago [facility] cough   [facility]   8 months ago Hypothyroidism, unspecified type   [facility] [place]   1 year ago Plantar keratosis, acquired   [facility]   1 year ago Benign essential HTN   [facility] [place]   1 year ago Benign essential HTN   [facility] [facility] [place] [place]      Future Appointments            In 3 months Pickard, [place]ie McgeeD [place]

## 2022-03-05 ENCOUNTER — Other Ambulatory Visit: Payer: Self-pay

## 2022-03-05 NOTE — Telephone Encounter (Signed)
Pharmacy faxed a refill request for levocetirizine (XYZAL) 5 MG tablet [[id number]]    Order Details Dose, Route, Frequency: As Directed  Dispense Quantity: 90 tablet Refills: 1        Sig: TAKE 1 TABLET BY MOUTH EVERY DAY IN THE EVENING       Start Date: 10/22/21 End Date: --  Written Date: 10/22/21 Expiration Date: 10/22/22  Original Order:  levocetirizine (XYZAL) 5 MG ta

## 2022-03-06 NOTE — Telephone Encounter (Signed)
Call to pharmacy- Rx picked up 02/15/22 #90- probable future fill request Requested Prescriptions  Pending Prescriptions Disp Refills  . levocetirizine (XYZAL) 5 MG tablet 90 tablet 1    Sig: every evening.     Ear, Nose, and Throat:  Antihistamines - levocetirizine dihydrochloride Failed - 03/05/2022  1:05 PM      Failed - Cr in normal range and within 360 days    Creat  Date Value Ref Range Status  12/19/2021 1.18 (H) 0.60 - 1.00 mg/dL Final         Passed - eGFR is 10 or above and within 360 days    GFR, Est African American  Date Value Ref Range Status  12/11/2020 64 > OR = 60 mL/min/1.40m2 Final   GFR, Est Non African American  Date Value Ref Range Status  12/11/2020 55 (L) > OR = 60 mL/min/1.58m2 Final   eGFR  Date Value Ref Range Status  12/19/2021 48 (L) > OR = 60 mL/min/1.34m2 Final    Comment:    The eGFR is based on the CKD-EPI 2021 equation. To calculate  the new eGFR from a previous Creatinine or Cystatin C result, go to https://www.kidney.org/professionals/ kdoqi/gfr%5Fcalculator          Passed - Valid encounter within last 12 months    Recent Outpatient Visits          3 months ago Chronic cough   [place] Dennard Schaumann, Cammie Mcgee, MD   8 months ago Hypothyroidism, unspecified type   [place] Pickard, Cammie Mcgee, MD   1 year ago Plantar keratosis, acquired   [place], Cammie Mcgee, MD   1 year ago Benign essential HTN   [place] Pickard, Cammie Mcgee, MD   1 year ago Benign essential HTN   [place] Pickard, Cammie Mcgee, MD      Future Appointments            In 3 months Pickard, Cammie Mcgee, MD [place]

## 2022-03-16 ENCOUNTER — Other Ambulatory Visit: Payer: Self-pay | Admitting: Family Medicine

## 2022-03-17 NOTE — Telephone Encounter (Signed)
Requested Prescriptions  Pending Prescriptions Disp Refills  . pantoprazole (PROTONIX) 40 MG tablet [Pharmacy Med Name: PANTOPRAZOLE SOD DR 40 MG TAB] 90 tablet 1    Sig: TAKE 1 TABLET BY MOUTH EVERY DAY     Gastroenterology: Proton Pump Inhibitors Passed - 03/16/2022  9:50 AM      Passed - Valid encounter within last 12 months    Recent Outpatient Visits          3 months ago Chronic cough   [place] Susy Frizzle, MD   9 months ago Hypothyroidism, unspecified type   [place] Pickard, Cammie Mcgee, MD   1 year ago Plantar keratosis, acquired   [place], Warren T, MD   1 year ago Benign essential HTN   [place] Pickard, Cammie Mcgee, MD   1 year ago Benign essential HTN   [place], Cammie Mcgee, MD      Future Appointments            In 3 months Pickard, Cammie Mcgee, MD [place]

## 2022-04-04 ENCOUNTER — Ambulatory Visit (INDEPENDENT_AMBULATORY_CARE_PROVIDER_SITE_OTHER): Payer: Medicare HMO | Admitting: Family Medicine

## 2022-04-04 VITALS — BP 120/72 | HR 69 | Temp 97.9°F | Ht 61.0 in | Wt 190.6 lb

## 2022-04-04 DIAGNOSIS — J329 Chronic sinusitis, unspecified: Secondary | ICD-10-CM

## 2022-04-04 MED ORDER — AMOXICILLIN-POT CLAVULANATE 875-125 MG PO TABS: 1.0000 | ORAL_TABLET | Freq: Two times a day (BID) | ORAL | 0 refills | Status: DC

## 2022-04-04 NOTE — Progress Notes (Signed)
Subjective:    [name], female    DOB: 06-08-1946, 76 y.o.   MRN: [id number]   Patient has been battling symptoms for months.  Symptoms include postnasal drip, rhinorrhea, head congestion, frontal sinus headache, pressure and pain behind her eyes.  She reports a cough due to postnasal drip.  She denies any fever.  She has been trying Xyzal and Singulair without relief.  She is also been taking Mucinex.  Symptoms have been going on now for approximately 4 weeks Past Medical History:  Diagnosis Date   CKD (chronic kidney disease) stage 3, GFR 30-59 ml/min (HCC)    Hypertension    Thyroid disease    hypothyroid   Past Surgical History:  Procedure Laterality Date   ABDOMINAL HYSTERECTOMY     CATARACT EXTRACTION     KNEE SURGERY Left    11 surgery    SHOULDER SURGERY Bilateral    Current Outpatient Medications on File Prior to Visit  Medication Sig Dispense Refill   aspirin 81 MG tablet Take 81 mg by mouth daily.     azithromycin (ZITHROMAX) 250 MG tablet Take 1 tablet (250 mg total) by mouth daily. Take first 2 tablets together, then 1 every day until finished. 6 tablet 0   benzonatate (TESSALON) 100 MG capsule Take 1 capsule (100 mg total) by mouth every 8 (eight) hours. 21 capsule 0   BIOTIN PO Take 1 tablet by mouth daily.     diclofenac (VOLTAREN) 75 MG EC tablet TAKE 1 TABLET BY MOUTH TWICE A DAY 60 tablet 0   docusate sodium (COLACE) 100 MG capsule Take 200 mg by mouth at bedtime.      fluticasone (FLONASE) 50 MCG/ACT nasal spray Place 2 sprays into both nostrils daily.     hydrochlorothiazide (HYDRODIURIL) 25 MG tablet TAKE 1 TABLET (25 MG TOTAL) BY MOUTH DAILY. 90 tablet 0   levocetirizine (XYZAL) 5 MG tablet TAKE 1 TABLET BY MOUTH EVERY DAY IN THE EVENING 90 tablet 1   levothyroxine (SYNTHROID) 88 MCG tablet Take 1 tablet (88 mcg total) by mouth daily. 90 tablet 3   montelukast (SINGULAIR) 10 MG tablet TAKE 1 TABLET BY MOUTH EVERYDAY AT BEDTIME 90 tablet  1   Multiple Vitamins-Minerals (AIRBORNE PO) Take 2 tablets by mouth daily.     pantoprazole (PROTONIX) 40 MG tablet TAKE 1 TABLET BY MOUTH EVERY DAY 90 tablet 1   valsartan (DIOVAN) 80 MG tablet Take 1 tablet (80 mg total) by mouth daily. 90 tablet 3   zolpidem (AMBIEN) 10 MG tablet Take 1 tablet (10 mg total) by mouth at bedtime as needed for sleep. 15 tablet 4   No current facility-administered medications on file prior to visit.   Allergies  Allergen Reactions   Erythromycin Base Other (See Comments)   Penicillins    Tylenol [Acetaminophen]    Cortisone Other (See Comments) and Rash    given injections-skin peels/redness   Social History   Socioeconomic History   Marital status: Married    Spouse name: Not on file   Number of children: Not on file   Years of education: Not on file   Highest education level: Not on file  Occupational History   Not on file  Tobacco Use   Smoking status: Never   Smokeless tobacco: Never  Substance and Sexual Activity   Alcohol use: Never   Drug use: Never   Sexual activity: Not on file  Other Topics Concern   Not on  file  Social History Narrative   Not on file   Social Determinants of Health   Financial Resource Strain: Low Risk  (11/29/2021)   Overall Financial Resource Strain (CARDIA)    Difficulty of Paying Living Expenses: Not hard at all  Food Insecurity: No Food Insecurity (11/29/2021)   Hunger Vital Sign    Worried About Running Out of Food in the Last Year: Never true    Ran Out of Food in the Last Year: Never true  Transportation Needs: No Transportation Needs (11/29/2021)   PRAPARE - Hydrologist (Medical): No    Lack of Transportation (Non-Medical): No  Physical Activity: Inactive (11/29/2021)   Exercise Vital Sign    Days of Exercise per Week: 0 days    Minutes of Exercise per Session: 0 min  Stress: No Stress Concern Present (11/29/2021)   [place]    Feeling of Stress : Not at all  Social Connections: [place] (11/29/2021)   Social Connection and Isolation Panel [NHANES]    Frequency of Communication with Friends and Family: More than three times a week    Frequency of Social Gatherings with Friends and Family: More than three times a week    Attends Religious Services: More than 4 times per year    Active Member of [employer] or Organizations: Yes    Attends Archivist Meetings: More than 4 times per year    Marital Status: Married  [name] resources officer Violence: Not At Risk (11/29/2021)   Humiliation, Afraid, Rape, and Kick questionnaire    Fear of Current or Ex-Partner: No    Emotionally Abused: No    Physically Abused: No    Sexually Abused: No      Review of Systems  All other systems reviewed and are negative.      Objective:   Physical Exam Vitals reviewed.  Constitutional:      Appearance: She is well-developed.  HENT:     Right Ear: Tympanic membrane and ear canal normal.     Left Ear: Tympanic membrane and ear canal normal.     Nose: Mucosal edema, congestion and rhinorrhea present.     Right Sinus: Frontal sinus tenderness present.     Left Sinus: Frontal sinus tenderness present.  Neck:     Thyroid: No thyromegaly.  Cardiovascular:     Rate and Rhythm: Normal rate and regular rhythm.     Heart sounds: Normal heart sounds.  Pulmonary:     Effort: Pulmonary effort is normal. No respiratory distress.     Breath sounds: Normal breath sounds. No wheezing or rales.  Abdominal:     General: Bowel sounds are normal. There is no distension.     Palpations: Abdomen is soft.     Tenderness: There is no abdominal tenderness. There is no guarding or rebound.  Musculoskeletal:     Cervical back: Neck supple.  Lymphadenopathy:     Cervical: No cervical adenopathy.           Assessment & Plan:  Rhinosinusitis Try the patient on Augmentin 875 mg twice daily for 10 days.  If  not improving, would recommend prednisone as well

## 2022-04-08 ENCOUNTER — Telehealth: Payer: Self-pay

## 2022-04-08 NOTE — Telephone Encounter (Signed)
Pt called to advised that the  Augmentin she was prescribed on 10/6 is not helping. Pt states that Prednisone was discussed. Can this be sent in for her? Thank you.  Pt uses [id number] in [place].

## 2022-04-10 ENCOUNTER — Other Ambulatory Visit: Payer: Self-pay

## 2022-04-10 ENCOUNTER — Other Ambulatory Visit: Payer: Self-pay | Admitting: Family Medicine

## 2022-04-10 MED ORDER — PREDNISONE 20 MG PO TABS: ORAL_TABLET | ORAL | 0 refills | Status: DC

## 2022-04-10 MED ORDER — LEVOCETIRIZINE DIHYDROCHLORIDE 5 MG PO TABS: ORAL_TABLET | ORAL | 2 refills | Status: DC

## 2022-04-10 NOTE — Telephone Encounter (Signed)
Requested Prescriptions  Pending Prescriptions Disp Refills  . levocetirizine (XYZAL) 5 MG tablet 90 tablet 2    Sig: TAKE 1 TABLET BY MOUTH EVERY DAY IN THE EVENING     Ear, Nose, and Throat:  Antihistamines - levocetirizine dihydrochloride Failed - 04/10/2022  1:20 PM      Failed - Cr in normal range and within 360 days    Creat  Date Value Ref Range Status  12/19/2021 1.18 (H) 0.60 - 1.00 mg/dL Final         Passed - eGFR is 10 or above and within 360 days    GFR, Est African American  Date Value Ref Range Status  12/11/2020 64 > OR = 60 mL/min/1.36m2 Final   GFR, Est Non African American  Date Value Ref Range Status  12/11/2020 55 (L) > OR = 60 mL/min/1.65m2 Final   eGFR  Date Value Ref Range Status  12/19/2021 48 (L) > OR = 60 mL/min/1.30m2 Final    Comment:    The eGFR is based on the CKD-EPI 2021 equation. To calculate  the new eGFR from a previous Creatinine or Cystatin C result, go to https://www.kidney.org/professionals/ kdoqi/gfr%5Fcalculator          Passed - Valid encounter within last 12 months    Recent Outpatient Visits          4 months ago Chronic cough   [place] Dennard Schaumann, Cammie Mcgee, MD   9 months ago Hypothyroidism, unspecified type   [place] Pickard, Cammie Mcgee, MD   1 year ago Plantar keratosis, acquired   [place], Cammie Mcgee, MD   1 year ago Benign essential HTN   [place] Pickard, Cammie Mcgee, MD   1 year ago Benign essential HTN   [place] Pickard, Cammie Mcgee, MD      Future Appointments            In 2 months Pickard, Cammie Mcgee, MD [place]

## 2022-04-10 NOTE — Telephone Encounter (Signed)
Pharmacy faxed a refill request for levocetirizine (XYZAL) 5 MG tablet [[id number]]    Order Details Dose, Route, Frequency: As Directed  Dispense Quantity: 90 tablet Refills: 1        Sig: TAKE 1 TABLET BY MOUTH EVERY DAY IN THE EVENING       Start Date: 10/22/21 End Date: --  Written Date: 10/22/21 Expiration Date: 10/22/22  Original Order:  levocetirizine (XYZAL) 5 MG tablet [[id number]]

## 2022-04-22 DIAGNOSIS — R9431 Abnormal electrocardiogram [ECG] [EKG]: Secondary | ICD-10-CM | POA: Diagnosis not present

## 2022-04-22 DIAGNOSIS — Z419 Encounter for procedure for purposes other than remedying health state, unspecified: Secondary | ICD-10-CM | POA: Diagnosis not present

## 2022-04-22 DIAGNOSIS — Z01818 Encounter for other preprocedural examination: Secondary | ICD-10-CM | POA: Diagnosis not present

## 2022-04-22 DIAGNOSIS — Z0000 Encounter for general adult medical examination without abnormal findings: Secondary | ICD-10-CM | POA: Diagnosis not present

## 2022-04-22 DIAGNOSIS — R200 Anesthesia of skin: Secondary | ICD-10-CM | POA: Diagnosis not present

## 2022-05-05 DIAGNOSIS — I10 Essential (primary) hypertension: Secondary | ICD-10-CM | POA: Diagnosis not present

## 2022-05-05 DIAGNOSIS — E785 Hyperlipidemia, unspecified: Secondary | ICD-10-CM | POA: Diagnosis not present

## 2022-05-05 DIAGNOSIS — G8918 Other acute postprocedural pain: Secondary | ICD-10-CM | POA: Diagnosis not present

## 2022-05-05 DIAGNOSIS — Z471 Aftercare following joint replacement surgery: Secondary | ICD-10-CM | POA: Diagnosis not present

## 2022-05-05 DIAGNOSIS — M1711 Unilateral primary osteoarthritis, right knee: Secondary | ICD-10-CM | POA: Diagnosis not present

## 2022-05-05 DIAGNOSIS — E039 Hypothyroidism, unspecified: Secondary | ICD-10-CM | POA: Diagnosis not present

## 2022-05-05 DIAGNOSIS — Z96651 Presence of right artificial knee joint: Secondary | ICD-10-CM | POA: Diagnosis not present

## 2022-05-05 DIAGNOSIS — Z79890 Hormone replacement therapy (postmenopausal): Secondary | ICD-10-CM | POA: Diagnosis not present

## 2022-05-06 DIAGNOSIS — E785 Hyperlipidemia, unspecified: Secondary | ICD-10-CM | POA: Diagnosis not present

## 2022-05-06 DIAGNOSIS — M1711 Unilateral primary osteoarthritis, right knee: Secondary | ICD-10-CM | POA: Diagnosis not present

## 2022-05-06 DIAGNOSIS — E039 Hypothyroidism, unspecified: Secondary | ICD-10-CM | POA: Diagnosis not present

## 2022-05-06 DIAGNOSIS — Z79890 Hormone replacement therapy (postmenopausal): Secondary | ICD-10-CM | POA: Diagnosis not present

## 2022-05-06 DIAGNOSIS — I10 Essential (primary) hypertension: Secondary | ICD-10-CM | POA: Diagnosis not present

## 2022-05-07 DIAGNOSIS — Z471 Aftercare following joint replacement surgery: Secondary | ICD-10-CM | POA: Diagnosis not present

## 2022-05-07 DIAGNOSIS — M25561 Pain in right knee: Secondary | ICD-10-CM | POA: Diagnosis not present

## 2022-05-08 DIAGNOSIS — M25561 Pain in right knee: Secondary | ICD-10-CM | POA: Diagnosis not present

## 2022-05-08 DIAGNOSIS — R262 Difficulty in walking, not elsewhere classified: Secondary | ICD-10-CM | POA: Diagnosis not present

## 2022-05-08 DIAGNOSIS — M25661 Stiffness of right knee, not elsewhere classified: Secondary | ICD-10-CM | POA: Diagnosis not present

## 2022-05-08 DIAGNOSIS — Z96651 Presence of right artificial knee joint: Secondary | ICD-10-CM | POA: Diagnosis not present

## 2022-05-08 DIAGNOSIS — M6281 Muscle weakness (generalized): Secondary | ICD-10-CM | POA: Diagnosis not present

## 2022-05-12 DIAGNOSIS — M25561 Pain in right knee: Secondary | ICD-10-CM | POA: Diagnosis not present

## 2022-05-12 DIAGNOSIS — Z96651 Presence of right artificial knee joint: Secondary | ICD-10-CM | POA: Diagnosis not present

## 2022-05-12 DIAGNOSIS — M6281 Muscle weakness (generalized): Secondary | ICD-10-CM | POA: Diagnosis not present

## 2022-05-12 DIAGNOSIS — R262 Difficulty in walking, not elsewhere classified: Secondary | ICD-10-CM | POA: Diagnosis not present

## 2022-05-12 DIAGNOSIS — M25661 Stiffness of right knee, not elsewhere classified: Secondary | ICD-10-CM | POA: Diagnosis not present

## 2022-05-14 DIAGNOSIS — M25561 Pain in right knee: Secondary | ICD-10-CM | POA: Diagnosis not present

## 2022-05-14 DIAGNOSIS — R262 Difficulty in walking, not elsewhere classified: Secondary | ICD-10-CM | POA: Diagnosis not present

## 2022-05-14 DIAGNOSIS — M25661 Stiffness of right knee, not elsewhere classified: Secondary | ICD-10-CM | POA: Diagnosis not present

## 2022-05-14 DIAGNOSIS — M6281 Muscle weakness (generalized): Secondary | ICD-10-CM | POA: Diagnosis not present

## 2022-05-14 DIAGNOSIS — Z96651 Presence of right artificial knee joint: Secondary | ICD-10-CM | POA: Diagnosis not present

## 2022-05-16 DIAGNOSIS — Z96651 Presence of right artificial knee joint: Secondary | ICD-10-CM | POA: Diagnosis not present

## 2022-05-16 DIAGNOSIS — M25661 Stiffness of right knee, not elsewhere classified: Secondary | ICD-10-CM | POA: Diagnosis not present

## 2022-05-16 DIAGNOSIS — M6281 Muscle weakness (generalized): Secondary | ICD-10-CM | POA: Diagnosis not present

## 2022-05-16 DIAGNOSIS — M25561 Pain in right knee: Secondary | ICD-10-CM | POA: Diagnosis not present

## 2022-05-16 DIAGNOSIS — R262 Difficulty in walking, not elsewhere classified: Secondary | ICD-10-CM | POA: Diagnosis not present

## 2022-05-19 DIAGNOSIS — M25561 Pain in right knee: Secondary | ICD-10-CM | POA: Diagnosis not present

## 2022-05-19 DIAGNOSIS — M6281 Muscle weakness (generalized): Secondary | ICD-10-CM | POA: Diagnosis not present

## 2022-05-19 DIAGNOSIS — M25661 Stiffness of right knee, not elsewhere classified: Secondary | ICD-10-CM | POA: Diagnosis not present

## 2022-05-19 DIAGNOSIS — R262 Difficulty in walking, not elsewhere classified: Secondary | ICD-10-CM | POA: Diagnosis not present

## 2022-05-19 DIAGNOSIS — Z96651 Presence of right artificial knee joint: Secondary | ICD-10-CM | POA: Diagnosis not present

## 2022-05-21 DIAGNOSIS — Z96651 Presence of right artificial knee joint: Secondary | ICD-10-CM | POA: Diagnosis not present

## 2022-05-21 DIAGNOSIS — M25561 Pain in right knee: Secondary | ICD-10-CM | POA: Diagnosis not present

## 2022-05-21 DIAGNOSIS — R262 Difficulty in walking, not elsewhere classified: Secondary | ICD-10-CM | POA: Diagnosis not present

## 2022-05-21 DIAGNOSIS — M6281 Muscle weakness (generalized): Secondary | ICD-10-CM | POA: Diagnosis not present

## 2022-05-21 DIAGNOSIS — M25661 Stiffness of right knee, not elsewhere classified: Secondary | ICD-10-CM | POA: Diagnosis not present

## 2022-05-23 DIAGNOSIS — M25661 Stiffness of right knee, not elsewhere classified: Secondary | ICD-10-CM | POA: Diagnosis not present

## 2022-05-23 DIAGNOSIS — R262 Difficulty in walking, not elsewhere classified: Secondary | ICD-10-CM | POA: Diagnosis not present

## 2022-05-23 DIAGNOSIS — M25561 Pain in right knee: Secondary | ICD-10-CM | POA: Diagnosis not present

## 2022-05-23 DIAGNOSIS — Z96651 Presence of right artificial knee joint: Secondary | ICD-10-CM | POA: Diagnosis not present

## 2022-05-23 DIAGNOSIS — M2351 Chronic instability of knee, right knee: Secondary | ICD-10-CM | POA: Diagnosis not present

## 2022-05-23 DIAGNOSIS — M6281 Muscle weakness (generalized): Secondary | ICD-10-CM | POA: Diagnosis not present

## 2022-05-26 DIAGNOSIS — Z96651 Presence of right artificial knee joint: Secondary | ICD-10-CM | POA: Diagnosis not present

## 2022-05-26 DIAGNOSIS — R262 Difficulty in walking, not elsewhere classified: Secondary | ICD-10-CM | POA: Diagnosis not present

## 2022-05-26 DIAGNOSIS — M25661 Stiffness of right knee, not elsewhere classified: Secondary | ICD-10-CM | POA: Diagnosis not present

## 2022-05-26 DIAGNOSIS — M25561 Pain in right knee: Secondary | ICD-10-CM | POA: Diagnosis not present

## 2022-05-26 DIAGNOSIS — M6281 Muscle weakness (generalized): Secondary | ICD-10-CM | POA: Diagnosis not present

## 2022-05-28 ENCOUNTER — Other Ambulatory Visit: Payer: Self-pay | Admitting: Family Medicine

## 2022-05-28 DIAGNOSIS — M25661 Stiffness of right knee, not elsewhere classified: Secondary | ICD-10-CM | POA: Diagnosis not present

## 2022-05-28 DIAGNOSIS — Z96651 Presence of right artificial knee joint: Secondary | ICD-10-CM | POA: Diagnosis not present

## 2022-05-28 DIAGNOSIS — R262 Difficulty in walking, not elsewhere classified: Secondary | ICD-10-CM | POA: Diagnosis not present

## 2022-05-28 DIAGNOSIS — Z1231 Encounter for screening mammogram for malignant neoplasm of breast: Secondary | ICD-10-CM

## 2022-05-28 DIAGNOSIS — M6281 Muscle weakness (generalized): Secondary | ICD-10-CM | POA: Diagnosis not present

## 2022-05-28 DIAGNOSIS — M25561 Pain in right knee: Secondary | ICD-10-CM | POA: Diagnosis not present

## 2022-05-29 DIAGNOSIS — Z96651 Presence of right artificial knee joint: Secondary | ICD-10-CM | POA: Diagnosis not present

## 2022-05-29 DIAGNOSIS — M6281 Muscle weakness (generalized): Secondary | ICD-10-CM | POA: Diagnosis not present

## 2022-05-29 DIAGNOSIS — M25561 Pain in right knee: Secondary | ICD-10-CM | POA: Diagnosis not present

## 2022-05-29 DIAGNOSIS — M25661 Stiffness of right knee, not elsewhere classified: Secondary | ICD-10-CM | POA: Diagnosis not present

## 2022-05-29 DIAGNOSIS — R262 Difficulty in walking, not elsewhere classified: Secondary | ICD-10-CM | POA: Diagnosis not present

## 2022-06-02 DIAGNOSIS — M25661 Stiffness of right knee, not elsewhere classified: Secondary | ICD-10-CM | POA: Diagnosis not present

## 2022-06-02 DIAGNOSIS — M25561 Pain in right knee: Secondary | ICD-10-CM | POA: Diagnosis not present

## 2022-06-02 DIAGNOSIS — Z96651 Presence of right artificial knee joint: Secondary | ICD-10-CM | POA: Diagnosis not present

## 2022-06-02 DIAGNOSIS — M6281 Muscle weakness (generalized): Secondary | ICD-10-CM | POA: Diagnosis not present

## 2022-06-02 DIAGNOSIS — R262 Difficulty in walking, not elsewhere classified: Secondary | ICD-10-CM | POA: Diagnosis not present

## 2022-06-04 DIAGNOSIS — M6281 Muscle weakness (generalized): Secondary | ICD-10-CM | POA: Diagnosis not present

## 2022-06-04 DIAGNOSIS — Z96651 Presence of right artificial knee joint: Secondary | ICD-10-CM | POA: Diagnosis not present

## 2022-06-04 DIAGNOSIS — M25561 Pain in right knee: Secondary | ICD-10-CM | POA: Diagnosis not present

## 2022-06-04 DIAGNOSIS — M25661 Stiffness of right knee, not elsewhere classified: Secondary | ICD-10-CM | POA: Diagnosis not present

## 2022-06-04 DIAGNOSIS — R262 Difficulty in walking, not elsewhere classified: Secondary | ICD-10-CM | POA: Diagnosis not present

## 2022-06-06 DIAGNOSIS — M25661 Stiffness of right knee, not elsewhere classified: Secondary | ICD-10-CM | POA: Diagnosis not present

## 2022-06-06 DIAGNOSIS — M25561 Pain in right knee: Secondary | ICD-10-CM | POA: Diagnosis not present

## 2022-06-06 DIAGNOSIS — R262 Difficulty in walking, not elsewhere classified: Secondary | ICD-10-CM | POA: Diagnosis not present

## 2022-06-06 DIAGNOSIS — Z96651 Presence of right artificial knee joint: Secondary | ICD-10-CM | POA: Diagnosis not present

## 2022-06-06 DIAGNOSIS — M6281 Muscle weakness (generalized): Secondary | ICD-10-CM | POA: Diagnosis not present

## 2022-06-09 DIAGNOSIS — M25561 Pain in right knee: Secondary | ICD-10-CM | POA: Diagnosis not present

## 2022-06-09 DIAGNOSIS — R262 Difficulty in walking, not elsewhere classified: Secondary | ICD-10-CM | POA: Diagnosis not present

## 2022-06-09 DIAGNOSIS — M25661 Stiffness of right knee, not elsewhere classified: Secondary | ICD-10-CM | POA: Diagnosis not present

## 2022-06-09 DIAGNOSIS — M6281 Muscle weakness (generalized): Secondary | ICD-10-CM | POA: Diagnosis not present

## 2022-06-09 DIAGNOSIS — Z96651 Presence of right artificial knee joint: Secondary | ICD-10-CM | POA: Diagnosis not present

## 2022-06-12 DIAGNOSIS — M6281 Muscle weakness (generalized): Secondary | ICD-10-CM | POA: Diagnosis not present

## 2022-06-12 DIAGNOSIS — Z96651 Presence of right artificial knee joint: Secondary | ICD-10-CM | POA: Diagnosis not present

## 2022-06-12 DIAGNOSIS — M25661 Stiffness of right knee, not elsewhere classified: Secondary | ICD-10-CM | POA: Diagnosis not present

## 2022-06-12 DIAGNOSIS — M25561 Pain in right knee: Secondary | ICD-10-CM | POA: Diagnosis not present

## 2022-06-12 DIAGNOSIS — R262 Difficulty in walking, not elsewhere classified: Secondary | ICD-10-CM | POA: Diagnosis not present

## 2022-06-13 ENCOUNTER — Other Ambulatory Visit: Payer: Medicare HMO

## 2022-06-13 DIAGNOSIS — M6281 Muscle weakness (generalized): Secondary | ICD-10-CM | POA: Diagnosis not present

## 2022-06-13 DIAGNOSIS — M25561 Pain in right knee: Secondary | ICD-10-CM | POA: Diagnosis not present

## 2022-06-13 DIAGNOSIS — I10 Essential (primary) hypertension: Secondary | ICD-10-CM

## 2022-06-13 DIAGNOSIS — R262 Difficulty in walking, not elsewhere classified: Secondary | ICD-10-CM | POA: Diagnosis not present

## 2022-06-13 DIAGNOSIS — E785 Hyperlipidemia, unspecified: Secondary | ICD-10-CM

## 2022-06-13 DIAGNOSIS — E039 Hypothyroidism, unspecified: Secondary | ICD-10-CM

## 2022-06-13 DIAGNOSIS — M25661 Stiffness of right knee, not elsewhere classified: Secondary | ICD-10-CM | POA: Diagnosis not present

## 2022-06-13 DIAGNOSIS — N183 Chronic kidney disease, stage 3 unspecified: Secondary | ICD-10-CM

## 2022-06-13 DIAGNOSIS — Z96651 Presence of right artificial knee joint: Secondary | ICD-10-CM | POA: Diagnosis not present

## 2022-06-14 LAB — CBC WITH DIFFERENTIAL/PLATELET
Absolute Monocytes: 463 cells/uL (ref 200–950)
Basophils Absolute: 62 cells/uL (ref 0–200)
Basophils Relative: 1.2 %
Eosinophils Absolute: 244 cells/uL (ref 15–500)
Eosinophils Relative: 4.7 %
HCT: 38.6 % (ref 35.0–45.0)
Hemoglobin: 12.9 g/dL (ref 11.7–15.5)
Lymphs Abs: 2038 cells/uL (ref 850–3900)
MCH: 30.7 pg (ref 27.0–33.0)
MCHC: 33.4 g/dL (ref 32.0–36.0)
MCV: 91.9 fL (ref 80.0–100.0)
MPV: 9.5 fL (ref 7.5–12.5)
Monocytes Relative: 8.9 %
Neutro Abs: 2392 cells/uL (ref 1500–7800)
Neutrophils Relative %: 46 %
Platelets: 317 10*3/uL (ref 140–400)
RBC: 4.2 10*6/uL (ref 3.80–5.10)
RDW: 12.2 % (ref 11.0–15.0)
Total Lymphocyte: 39.2 %
WBC: 5.2 10*3/uL (ref 3.8–10.8)

## 2022-06-14 LAB — COMPLETE METABOLIC PANEL WITH GFR
AG Ratio: 1.3 (calc) (ref 1.0–2.5)
ALT: 9 U/L (ref 6–29)
AST: 16 U/L (ref 10–35)
Albumin: 3.9 g/dL (ref 3.6–5.1)
Alkaline phosphatase (APISO): 60 U/L (ref 37–153)
BUN: 14 mg/dL (ref 7–25)
CO2: 28 mmol/L (ref 20–32)
Calcium: 9.3 mg/dL (ref 8.6–10.4)
Chloride: 102 mmol/L (ref 98–110)
Creat: 0.96 mg/dL (ref 0.60–1.00)
Globulin: 3.1 g/dL (calc) (ref 1.9–3.7)
Glucose, Bld: 100 mg/dL — ABNORMAL HIGH (ref 65–99)
Potassium: 3.9 mmol/L (ref 3.5–5.3)
Sodium: 141 mmol/L (ref 135–146)
Total Bilirubin: 0.6 mg/dL (ref 0.2–1.2)
Total Protein: 7 g/dL (ref 6.1–8.1)
eGFR: 61 mL/min/{1.73_m2} (ref >=60)

## 2022-06-14 LAB — LIPID PANEL
Cholesterol: 185 mg/dL (ref <200)
HDL: 55 mg/dL (ref >=50)
LDL Cholesterol (Calc): 110 mg/dL (calc) — ABNORMAL HIGH
Non-HDL Cholesterol (Calc): 130 mg/dL (calc) — ABNORMAL HIGH (ref <130)
Total CHOL/HDL Ratio: 3.4 (calc) (ref <5.0)
Triglycerides: 101 mg/dL (ref <150)

## 2022-06-14 LAB — TSH: TSH: 3.45 mIU/L (ref 0.40–4.50)

## 2022-06-16 DIAGNOSIS — R262 Difficulty in walking, not elsewhere classified: Secondary | ICD-10-CM | POA: Diagnosis not present

## 2022-06-16 DIAGNOSIS — Z96651 Presence of right artificial knee joint: Secondary | ICD-10-CM | POA: Diagnosis not present

## 2022-06-16 DIAGNOSIS — M6281 Muscle weakness (generalized): Secondary | ICD-10-CM | POA: Diagnosis not present

## 2022-06-16 DIAGNOSIS — M25661 Stiffness of right knee, not elsewhere classified: Secondary | ICD-10-CM | POA: Diagnosis not present

## 2022-06-16 DIAGNOSIS — M25561 Pain in right knee: Secondary | ICD-10-CM | POA: Diagnosis not present

## 2022-06-17 DIAGNOSIS — Z96651 Presence of right artificial knee joint: Secondary | ICD-10-CM | POA: Diagnosis not present

## 2022-06-17 DIAGNOSIS — Z9660 Presence of unspecified orthopedic joint implant: Secondary | ICD-10-CM | POA: Diagnosis not present

## 2022-06-18 ENCOUNTER — Other Ambulatory Visit: Payer: Self-pay

## 2022-06-18 DIAGNOSIS — I10 Essential (primary) hypertension: Secondary | ICD-10-CM

## 2022-06-18 MED ORDER — HYDROCHLOROTHIAZIDE 25 MG PO TABS: 25.0000 mg | ORAL_TABLET | Freq: Every day | ORAL | 3 refills | Status: DC

## 2022-06-19 DIAGNOSIS — M25661 Stiffness of right knee, not elsewhere classified: Secondary | ICD-10-CM | POA: Diagnosis not present

## 2022-06-19 DIAGNOSIS — Z96651 Presence of right artificial knee joint: Secondary | ICD-10-CM | POA: Diagnosis not present

## 2022-06-19 DIAGNOSIS — R262 Difficulty in walking, not elsewhere classified: Secondary | ICD-10-CM | POA: Diagnosis not present

## 2022-06-19 DIAGNOSIS — M6281 Muscle weakness (generalized): Secondary | ICD-10-CM | POA: Diagnosis not present

## 2022-06-19 DIAGNOSIS — M25561 Pain in right knee: Secondary | ICD-10-CM | POA: Diagnosis not present

## 2022-06-20 ENCOUNTER — Encounter: Payer: Self-pay | Admitting: Family Medicine

## 2022-06-20 ENCOUNTER — Ambulatory Visit (INDEPENDENT_AMBULATORY_CARE_PROVIDER_SITE_OTHER): Payer: Medicare HMO | Admitting: Family Medicine

## 2022-06-20 VITALS — BP 124/72 | HR 69 | Ht 61.0 in | Wt 191.4 lb

## 2022-06-20 DIAGNOSIS — E039 Hypothyroidism, unspecified: Secondary | ICD-10-CM

## 2022-06-20 DIAGNOSIS — N183 Chronic kidney disease, stage 3 unspecified: Secondary | ICD-10-CM

## 2022-06-20 DIAGNOSIS — I10 Essential (primary) hypertension: Secondary | ICD-10-CM | POA: Diagnosis not present

## 2022-06-20 NOTE — Progress Notes (Signed)
Subjective:    [name], female    DOB: 11/12/1945, 76 y.o.   MRN: [id number]  Patient is here today for follow-up.  She denies any chest pain shortness of breath or dyspnea on exertion.  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  She denies any orthopnea or paroxysmal nocturnal dyspnea.  Her blood pressure is well-controlled today.  Her chronic kidney disease is actually improved with her GFR now greater than 60.  Her thyroid test is normal.  Her LDL cholesterol is acceptable although greater than 100 No visits with results within 1 Week(s) from this visit.  Latest known visit with results is:  Lab on 06/13/2022  Component Date Value Ref Range Status   WBC 06/13/2022 5.2  3.8 - 10.8 Thousand/uL Final   RBC 06/13/2022 4.20  3.80 - 5.10 Million/uL Final   Hemoglobin 06/13/2022 12.9  11.7 - 15.5 g/dL Final   HCT 06/13/2022 38.6  35.0 - 45.0 % Final   MCV 06/13/2022 91.9  80.0 - 100.0 fL Final   MCH 06/13/2022 30.7  27.0 - 33.0 pg Final   MCHC 06/13/2022 33.4  32.0 - 36.0 g/dL Final   RDW 06/13/2022 12.2  11.0 - 15.0 % Final   Platelets 06/13/2022 317  140 - 400 Thousand/uL Final   MPV 06/13/2022 9.5  7.5 - 12.5 fL Final   Neutro Abs 06/13/2022 2,392  1,500 - 7,800 cells/uL Final   Lymphs Abs 06/13/2022 2,038  850 - 3,900 cells/uL Final   Absolute Monocytes 06/13/2022 463  200 - 950 cells/uL Final   Eosinophils Absolute 06/13/2022 244  15 - 500 cells/uL Final   Basophils Absolute 06/13/2022 62  0 - 200 cells/uL Final   Neutrophils Relative % 06/13/2022 46  % Final   Total Lymphocyte 06/13/2022 39.2  % Final   Monocytes Relative 06/13/2022 8.9  % Final   Eosinophils Relative 06/13/2022 4.7  % Final   Basophils Relative 06/13/2022 1.2  % Final   Glucose, Bld 06/13/2022 100 (H)  65 - 99 mg/dL Final   Comment: .            Fasting reference interval . For someone without known diabetes, a glucose value between 100 and 125 mg/dL is consistent  with prediabetes and should be confirmed with a follow-up test. .    BUN 06/13/2022 14  7 - 25 mg/dL Final   Creat 06/13/2022 0.96  0.60 - 1.00 mg/dL Final   eGFR 06/13/2022 61  > OR = 60 mL/min/1.65m Final   BUN/Creatinine Ratio 06/13/2022 SEE NOTE:  6 - 22 (calc) Final   Comment:    Not Reported: BUN and Creatinine are within    reference range. .    Sodium 06/13/2022 141  135 - 146 mmol/L Final   Potassium 06/13/2022 3.9  3.5 - 5.3 mmol/L Final   Chloride 06/13/2022 102  98 - 110 mmol/L Final   CO2 06/13/2022 28  20 - 32 mmol/L Final   Calcium 06/13/2022 9.3  8.6 - 10.4 mg/dL Final   Total Protein 06/13/2022 7.0  6.1 - 8.1 g/dL Final   Albumin 06/13/2022 3.9  3.6 - 5.1 g/dL Final   Globulin 06/13/2022 3.1  1.9 - 3.7 g/dL (calc) Final   AG Ratio 06/13/2022 1.3  1.0 - 2.5 (calc) Final   Total Bilirubin 06/13/2022 0.6  0.2 - 1.2 mg/dL Final   Alkaline phosphatase (APISO) 06/13/2022 60  37 - 153 U/L Final   AST  06/13/2022 16  10 - 35 U/L Final   ALT 06/13/2022 9  6 - 29 U/L Final   Cholesterol 06/13/2022 185  <200 mg/dL Final   HDL 06/13/2022 55  > OR = 50 mg/dL Final   Triglycerides 06/13/2022 101  <150 mg/dL Final   LDL Cholesterol (Calc) 06/13/2022 110 (H)  mg/dL (calc) Final   Comment: Reference range: <100 . Desirable range <100 mg/dL for primary prevention;   <70 mg/dL for patients with CHD or diabetic patients  with > or = 2 CHD risk factors. Marland Kitchen LDL-C is now calculated using the Martin-Hopkins  calculation, which is a validated novel method providing  better accuracy than the Friedewald equation in the  estimation of LDL-C.  Cresenciano Genre et al. Annamaria Helling. 2409;735(32): 2061-2068  (http://education.[facility].com/faq/FAQ164)    Total CHOL/HDL Ratio 06/13/2022 3.4  <5.0 (calc) Final   Non-HDL Cholesterol (Calc) 06/13/2022 130 (H)  <130 mg/dL (calc) Final   Comment: For patients with diabetes plus 1 major ASCVD risk  factor, treating to a non-HDL-C goal of <100 mg/dL   (LDL-C of <70 mg/dL) is considered a therapeutic  option.    TSH 06/13/2022 3.45  0.40 - 4.50 mIU/L Final    Past Medical History:  Diagnosis Date   CKD (chronic kidney disease) stage 3, GFR 30-59 ml/min (HCC)    Hypertension    Thyroid disease    hypothyroid   Past Surgical History:  Procedure Laterality Date   ABDOMINAL HYSTERECTOMY     CATARACT EXTRACTION     KNEE SURGERY Left    11 surgery    SHOULDER SURGERY Bilateral    Current Outpatient Medications on File Prior to Visit  Medication Sig Dispense Refill   amoxicillin-clavulanate (AUGMENTIN) 875-125 MG tablet Take 1 tablet by mouth 2 (two) times daily. 20 tablet 0   aspirin 81 MG tablet Take 81 mg by mouth daily.     benzonatate (TESSALON) 100 MG capsule Take 1 capsule (100 mg total) by mouth every 8 (eight) hours. 21 capsule 0   BIOTIN PO Take 1 tablet by mouth daily.     diclofenac (VOLTAREN) 75 MG EC tablet TAKE 1 TABLET BY MOUTH TWICE A DAY 60 tablet 0   docusate sodium (COLACE) 100 MG capsule Take 200 mg by mouth at bedtime.      FLUAD QUADRIVALENT 0.5 ML injection      fluticasone (FLONASE) 50 MCG/ACT nasal spray Place 2 sprays into both nostrils daily.     hydrochlorothiazide (HYDRODIURIL) 25 MG tablet Take 1 tablet (25 mg total) by mouth daily. 90 tablet 3   levocetirizine (XYZAL) 5 MG tablet TAKE 1 TABLET BY MOUTH EVERY DAY IN THE EVENING 90 tablet 2   levothyroxine (SYNTHROID) 88 MCG tablet Take 1 tablet (88 mcg total) by mouth daily. 90 tablet 3   montelukast (SINGULAIR) 10 MG tablet TAKE 1 TABLET BY MOUTH EVERYDAY AT BEDTIME 90 tablet 1   Multiple Vitamins-Minerals (AIRBORNE PO) Take 2 tablets by mouth daily.     pantoprazole (PROTONIX) 40 MG tablet TAKE 1 TABLET BY MOUTH EVERY DAY 90 tablet 1   predniSONE (DELTASONE) 20 MG tablet 3 tabs poqday 1-2, 2 tabs poqday 3-4, 1 tab poqday 5-6 12 tablet 0   valsartan (DIOVAN) 80 MG tablet Take 1 tablet (80 mg total) by mouth daily. 90 tablet 3   zolpidem (AMBIEN) 10  MG tablet Take 1 tablet (10 mg total) by mouth at bedtime as needed for sleep. 15 tablet 4   No  current facility-administered medications on file prior to visit.   Allergies  Allergen Reactions   Erythromycin Base Other (See Comments)   Penicillins    Tylenol [Acetaminophen]    Cortisone Other (See Comments) and Rash    given injections-skin peels/redness   Social History   Socioeconomic History   Marital status: Married    Spouse name: Not on file   Number of children: Not on file   Years of education: Not on file   Highest education level: Not on file  Occupational History   Not on file  Tobacco Use   Smoking status: Never   Smokeless tobacco: Never  Substance and Sexual Activity   Alcohol use: Never   Drug use: Never   Sexual activity: Not on file  Other Topics Concern   Not on file  Social History Narrative   Not on file   Social Determinants of Health   Financial Resource Strain: Low Risk  (11/29/2021)   Overall Financial Resource Strain (CARDIA)    Difficulty of Paying Living Expenses: Not hard at all  Food Insecurity: No Food Insecurity (11/29/2021)   Hunger Vital Sign    Worried About Running Out of Food in the Last Year: Never true    Ran Out of Food in the Last Year: Never true  Transportation Needs: No Transportation Needs (11/29/2021)   PRAPARE - Hydrologist (Medical): No    Lack of Transportation (Non-Medical): No  Physical Activity: Inactive (11/29/2021)   Exercise Vital Sign    Days of Exercise per Week: 0 days    Minutes of Exercise per Session: 0 min  Stress: No Stress Concern Present (11/29/2021)   [place]    Feeling of Stress : Not at all  Social Connections: [place] (11/29/2021)   Social Connection and Isolation Panel [NHANES]    Frequency of Communication with Friends and Family: More than three times a week    Frequency of Social Gatherings  with Friends and Family: More than three times a week    Attends Religious Services: More than 4 times per year    Active Member of [employer] or Organizations: Yes    Attends Archivist Meetings: More than 4 times per year    Marital Status: Married  [name] resources officer Violence: Not At Risk (11/29/2021)   Humiliation, Afraid, Rape, and Kick questionnaire    Fear of Current or Ex-Partner: No    Emotionally Abused: No    Physically Abused: No    Sexually Abused: No      Review of Systems  All other systems reviewed and are negative.      Objective:   Physical Exam Vitals reviewed.  Constitutional:      Appearance: She is well-developed.  HENT:     Nose: Nose normal.  Neck:     Thyroid: No thyromegaly.  Cardiovascular:     Rate and Rhythm: Normal rate and regular rhythm.     Heart sounds: Normal heart sounds.  Pulmonary:     Effort: Pulmonary effort is normal. No respiratory distress.     Breath sounds: Normal breath sounds. No wheezing or rales.  Abdominal:     General: Bowel sounds are normal. There is no distension.     Palpations: Abdomen is soft.     Tenderness: There is no abdominal tenderness. There is no guarding or rebound.  Musculoskeletal:     Cervical back: Neck supple.  Lymphadenopathy:     Cervical: No cervical adenopathy.           Assessment & Plan:  Benign essential HTN  Hypothyroidism, unspecified type  Stage 3 chronic kidney disease, unspecified whether stage 3a or 3b CKD ([place]) Patient's blood work looks excellent.  Her blood pressure is outstanding.  Her kidney function has improved.  I will make no changes in her medications at the present time and reassess the patient in 6 months

## 2022-06-24 DIAGNOSIS — M6281 Muscle weakness (generalized): Secondary | ICD-10-CM | POA: Diagnosis not present

## 2022-06-24 DIAGNOSIS — M25661 Stiffness of right knee, not elsewhere classified: Secondary | ICD-10-CM | POA: Diagnosis not present

## 2022-06-24 DIAGNOSIS — Z96651 Presence of right artificial knee joint: Secondary | ICD-10-CM | POA: Diagnosis not present

## 2022-06-24 DIAGNOSIS — R262 Difficulty in walking, not elsewhere classified: Secondary | ICD-10-CM | POA: Diagnosis not present

## 2022-06-24 DIAGNOSIS — M25561 Pain in right knee: Secondary | ICD-10-CM | POA: Diagnosis not present

## 2022-06-26 DIAGNOSIS — M25561 Pain in right knee: Secondary | ICD-10-CM | POA: Diagnosis not present

## 2022-06-26 DIAGNOSIS — M25661 Stiffness of right knee, not elsewhere classified: Secondary | ICD-10-CM | POA: Diagnosis not present

## 2022-06-26 DIAGNOSIS — M6281 Muscle weakness (generalized): Secondary | ICD-10-CM | POA: Diagnosis not present

## 2022-06-26 DIAGNOSIS — R262 Difficulty in walking, not elsewhere classified: Secondary | ICD-10-CM | POA: Diagnosis not present

## 2022-06-26 DIAGNOSIS — Z96651 Presence of right artificial knee joint: Secondary | ICD-10-CM | POA: Diagnosis not present

## 2022-06-27 ENCOUNTER — Other Ambulatory Visit: Payer: Self-pay | Admitting: Family Medicine

## 2022-09-15 ENCOUNTER — Ambulatory Visit
Admission: RE | Admit: 2022-09-15 | Discharge: 2022-09-15 | Disposition: A | Discharge status: Home / Self Care | Payer: Medicare HMO | Source: Ambulatory Visit | Attending: Family Medicine | Admitting: Family Medicine

## 2022-09-15 DIAGNOSIS — Z1231 Encounter for screening mammogram for malignant neoplasm of breast: Secondary | ICD-10-CM | POA: Diagnosis not present

## 2022-09-16 DIAGNOSIS — Z96653 Presence of artificial knee joint, bilateral: Secondary | ICD-10-CM | POA: Diagnosis not present

## 2022-12-09 ENCOUNTER — Other Ambulatory Visit: Payer: Medicare HMO

## 2022-12-09 DIAGNOSIS — I10 Essential (primary) hypertension: Secondary | ICD-10-CM | POA: Diagnosis not present

## 2022-12-09 DIAGNOSIS — N183 Chronic kidney disease, stage 3 unspecified: Secondary | ICD-10-CM

## 2022-12-09 DIAGNOSIS — E039 Hypothyroidism, unspecified: Secondary | ICD-10-CM

## 2022-12-09 DIAGNOSIS — E785 Hyperlipidemia, unspecified: Secondary | ICD-10-CM

## 2022-12-10 LAB — COMPLETE METABOLIC PANEL WITH GFR
AG Ratio: 1.8 (calc) (ref 1.0–2.5)
ALT: 13 U/L (ref 6–29)
AST: 16 U/L (ref 10–35)
Albumin: 4.2 g/dL (ref 3.6–5.1)
Alkaline phosphatase (APISO): 56 U/L (ref 37–153)
BUN/Creatinine Ratio: 20 (calc) (ref 6–22)
BUN: 20 mg/dL (ref 7–25)
CO2: 26 mmol/L (ref 20–32)
Calcium: 9.5 mg/dL (ref 8.6–10.4)
Chloride: 103 mmol/L (ref 98–110)
Creat: 1.01 mg/dL — ABNORMAL HIGH (ref 0.60–1.00)
Globulin: 2.3 g/dL (calc) (ref 1.9–3.7)
Glucose, Bld: 104 mg/dL — ABNORMAL HIGH (ref 65–99)
Potassium: 3.9 mmol/L (ref 3.5–5.3)
Sodium: 140 mmol/L (ref 135–146)
Total Bilirubin: 0.4 mg/dL (ref 0.2–1.2)
Total Protein: 6.5 g/dL (ref 6.1–8.1)
eGFR: 58 mL/min/{1.73_m2} — ABNORMAL LOW (ref >=60)

## 2022-12-10 LAB — TSH: TSH: 3.41 mIU/L (ref 0.40–4.50)

## 2022-12-10 LAB — CBC WITH DIFFERENTIAL/PLATELET
Absolute Monocytes: 400 cells/uL (ref 200–950)
Basophils Absolute: 59 cells/uL (ref 0–200)
Basophils Relative: 1.1 %
Eosinophils Absolute: 319 cells/uL (ref 15–500)
Eosinophils Relative: 5.9 %
HCT: 42 % (ref 35.0–45.0)
Hemoglobin: 13.9 g/dL (ref 11.7–15.5)
Lymphs Abs: 1642 cells/uL (ref 850–3900)
MCH: 30.6 pg (ref 27.0–33.0)
MCHC: 33.1 g/dL (ref 32.0–36.0)
MCV: 92.5 fL (ref 80.0–100.0)
MPV: 9.9 fL (ref 7.5–12.5)
Monocytes Relative: 7.4 %
Neutro Abs: 2981 cells/uL (ref 1500–7800)
Neutrophils Relative %: 55.2 %
Platelets: 269 10*3/uL (ref 140–400)
RBC: 4.54 10*6/uL (ref 3.80–5.10)
RDW: 12.4 % (ref 11.0–15.0)
Total Lymphocyte: 30.4 %
WBC: 5.4 10*3/uL (ref 3.8–10.8)

## 2022-12-10 LAB — LIPID PANEL
Cholesterol: 187 mg/dL (ref <200)
HDL: 64 mg/dL (ref >=50)
LDL Cholesterol (Calc): 104 mg/dL (calc) — ABNORMAL HIGH
Non-HDL Cholesterol (Calc): 123 mg/dL (calc) (ref <130)
Total CHOL/HDL Ratio: 2.9 (calc) (ref <5.0)
Triglycerides: 96 mg/dL (ref <150)

## 2022-12-15 ENCOUNTER — Ambulatory Visit (INDEPENDENT_AMBULATORY_CARE_PROVIDER_SITE_OTHER): Payer: Medicare HMO | Admitting: Family Medicine

## 2022-12-15 ENCOUNTER — Encounter: Payer: Self-pay | Admitting: Family Medicine

## 2022-12-15 VITALS — BP 122/72 | HR 89 | Temp 97.6°F | Ht 61.0 in | Wt 195.2 lb

## 2022-12-15 DIAGNOSIS — E039 Hypothyroidism, unspecified: Secondary | ICD-10-CM

## 2022-12-15 DIAGNOSIS — I10 Essential (primary) hypertension: Secondary | ICD-10-CM

## 2022-12-15 NOTE — Progress Notes (Signed)
Subjective:    [name], female    DOB: Nov 06, 1945, 77 y.o.   MRN: [id number]  Patient is here today for follow-up.  She is doing well.  She denies any chest pain shortness of breath or dyspnea on exertion.  She denies any lightheadedness.  She denies any nausea or vomiting or abdominal pain.  Her most recent lab work is listed below.  Her blood pressure today is excellent Lab on 12/09/2022  Component Date Value Ref Range Status   TSH 12/09/2022 3.41  0.40 - 4.50 mIU/L Final   WBC 12/09/2022 5.4  3.8 - 10.8 Thousand/uL Final   RBC 12/09/2022 4.54  3.80 - 5.10 Million/uL Final   Hemoglobin 12/09/2022 13.9  11.7 - 15.5 g/dL Final   HCT 78/29/5621 42.0  35.0 - 45.0 % Final   MCV 12/09/2022 92.5  80.0 - 100.0 fL Final   MCH 12/09/2022 30.6  27.0 - 33.0 pg Final   MCHC 12/09/2022 33.1  32.0 - 36.0 g/dL Final   RDW 30/86/5784 12.4  11.0 - 15.0 % Final   Platelets 12/09/2022 269  140 - 400 Thousand/uL Final   MPV 12/09/2022 9.9  7.5 - 12.5 fL Final   Neutro Abs 12/09/2022 2,981  1,500 - 7,800 cells/uL Final   Lymphs Abs 12/09/2022 1,642  850 - 3,900 cells/uL Final   Absolute Monocytes 12/09/2022 400  200 - 950 cells/uL Final   Eosinophils Absolute 12/09/2022 319  15 - 500 cells/uL Final   Basophils Absolute 12/09/2022 59  0 - 200 cells/uL Final   Neutrophils Relative % 12/09/2022 55.2  % Final   Total Lymphocyte 12/09/2022 30.4  % Final   Monocytes Relative 12/09/2022 7.4  % Final   Eosinophils Relative 12/09/2022 5.9  % Final   Basophils Relative 12/09/2022 1.1  % Final   Glucose, Bld 12/09/2022 104 (H)  65 - 99 mg/dL Final   Comment: .            Fasting reference interval . For someone without known diabetes, a glucose value between 100 and 125 mg/dL is consistent with prediabetes and should be confirmed with a follow-up test. .    BUN 12/09/2022 20  7 - 25 mg/dL Final   Creat 69/62/9528 1.01 (H)  0.60 - 1.00 mg/dL Final   eGFR 41/32/4401 58 (L)  > OR = 60  mL/min/1.7m2 Final   BUN/Creatinine Ratio 12/09/2022 20  6 - 22 (calc) Final   Sodium 12/09/2022 140  135 - 146 mmol/L Final   Potassium 12/09/2022 3.9  3.5 - 5.3 mmol/L Final   Chloride 12/09/2022 103  98 - 110 mmol/L Final   CO2 12/09/2022 26  20 - 32 mmol/L Final   Calcium 12/09/2022 9.5  8.6 - 10.4 mg/dL Final   Total Protein 02/72/5366 6.5  6.1 - 8.1 g/dL Final   Albumin 44/08/4740 4.2  3.6 - 5.1 g/dL Final   Globulin 59/56/3875 2.3  1.9 - 3.7 g/dL (calc) Final   AG Ratio 12/09/2022 1.8  1.0 - 2.5 (calc) Final   Total Bilirubin 12/09/2022 0.4  0.2 - 1.2 mg/dL Final   Alkaline phosphatase (APISO) 12/09/2022 56  37 - 153 U/L Final   AST 12/09/2022 16  10 - 35 U/L Final   ALT 12/09/2022 13  6 - 29 U/L Final   Cholesterol 12/09/2022 187  <200 mg/dL Final   HDL 64/33/2951 64  > OR = 50 mg/dL Final   Triglycerides 88/41/6606 96  <150  mg/dL Final   LDL Cholesterol (Calc) 12/09/2022 104 (H)  mg/dL (calc) Final   Comment: Reference range: <100 . Desirable range <100 mg/dL for primary prevention;   <70 mg/dL for patients with CHD or diabetic patients  with > or = 2 CHD risk factors. Marland Kitchen LDL-C is now calculated using the Martin-Hopkins  calculation, which is a validated novel method providing  better accuracy than the Friedewald equation in the  estimation of LDL-C.  Horald Pollen et al. Lenox Ahr. 4098;119(14): 2061-2068  (http://education.[facility].com/faq/FAQ164)    Total CHOL/HDL Ratio 12/09/2022 2.9  <7.8 (calc) Final   Non-HDL Cholesterol (Calc) 12/09/2022 123  <130 mg/dL (calc) Final   Comment: For patients with diabetes plus 1 major ASCVD risk  factor, treating to a non-HDL-C goal of <100 mg/dL  (LDL-C of <29 mg/dL) is considered a therapeutic  option.     Past Medical History:  Diagnosis Date   CKD (chronic kidney disease) stage 3, GFR 30-59 ml/min (HCC)    Hypertension    Thyroid disease    hypothyroid   Past Surgical History:  Procedure Laterality Date   ABDOMINAL  HYSTERECTOMY     CATARACT EXTRACTION     KNEE SURGERY Left    11 surgery    SHOULDER SURGERY Bilateral    Current Outpatient Medications on File Prior to Visit  Medication Sig Dispense Refill   aspirin 81 MG tablet Take 81 mg by mouth daily.     BIOTIN PO Take 1 tablet by mouth daily.     docusate sodium (COLACE) 100 MG capsule Take 200 mg by mouth at bedtime.      hydrochlorothiazide (HYDRODIURIL) 25 MG tablet Take 1 tablet (25 mg total) by mouth daily. 90 tablet 3   levocetirizine (XYZAL) 5 MG tablet TAKE 1 TABLET BY MOUTH EVERY DAY IN THE EVENING 90 tablet 2   levothyroxine (SYNTHROID) 88 MCG tablet Take 1 tablet (88 mcg total) by mouth daily. 90 tablet 3   montelukast (SINGULAIR) 10 MG tablet TAKE 1 TABLET BY MOUTH EVERYDAY AT BEDTIME 90 tablet 1   valsartan (DIOVAN) 80 MG tablet Take 1 tablet (80 mg total) by mouth daily. 90 tablet 3   No current facility-administered medications on file prior to visit.   Allergies  Allergen Reactions   Erythromycin Base Other (See Comments)   Penicillins    Tylenol [Acetaminophen]    Cortisone Other (See Comments) and Rash    given injections-skin peels/redness   Social History   Socioeconomic History   Marital status: Married    Spouse name: Not on file   Number of children: Not on file   Years of education: Not on file   Highest education level: Not on file  Occupational History   Not on file  Tobacco Use   Smoking status: Never   Smokeless tobacco: Never  Substance and Sexual Activity   Alcohol use: Never   Drug use: Never   Sexual activity: Not on file  Other Topics Concern   Not on file  Social History Narrative   Not on file   Social Determinants of Health   Financial Resource Strain: Low Risk  (11/29/2021)   Overall Financial Resource Strain (CARDIA)    Difficulty of Paying Living Expenses: Not hard at all  Food Insecurity: No Food Insecurity (11/29/2021)   Hunger Vital Sign    Worried About Running Out of Food in the  Last Year: Never true    Ran Out of Food in the Last Year: Never  true  Transportation Needs: No Transportation Needs (11/29/2021)   PRAPARE - Administrator, Civil Service (Medical): No    Lack of Transportation (Non-Medical): No  Physical Activity: Inactive (11/29/2021)   Exercise Vital Sign    Days of Exercise per Week: 0 days    Minutes of Exercise per Session: 0 min  Stress: No Stress Concern Present (11/29/2021)   [employer] of Occupational Health - Occupational Stress Questionnaire    Feeling of Stress : Not at all  Social Connections: Socially Integrated (11/29/2021)   Social Connection and Isolation Panel [NHANES]    Frequency of Communication with Friends and Family: More than three times a week    Frequency of Social Gatherings with Friends and Family: More than three times a week    Attends Religious Services: More than 4 times per year    Active Member of [employer] or Organizations: Yes    Attends Banker Meetings: More than 4 times per year    Marital Status: Married  Catering manager Violence: Not At Risk (11/29/2021)   Humiliation, Afraid, Rape, and Kick questionnaire    Fear of Current or Ex-Partner: No    Emotionally Abused: No    Physically Abused: No    Sexually Abused: No      Review of Systems  All other systems reviewed and are negative.      Objective:   Physical Exam Vitals reviewed.  Constitutional:      Appearance: She is well-developed.  HENT:     Nose: Nose normal.  Neck:     Thyroid: No thyromegaly.  Cardiovascular:     Rate and Rhythm: Normal rate and regular rhythm.     Heart sounds: Normal heart sounds.  Pulmonary:     Effort: Pulmonary effort is normal. No respiratory distress.     Breath sounds: Normal breath sounds. No wheezing or rales.  Abdominal:     General: Bowel sounds are normal. There is no distension.     Palpations: Abdomen is soft.     Tenderness: There is no abdominal tenderness. There is no guarding  or rebound.  Musculoskeletal:     Cervical back: Neck supple.  Lymphadenopathy:     Cervical: No cervical adenopathy.           Assessment & Plan:  Benign essential HTN  Hypothyroidism, unspecified type Patient's blood work is acceptable.  Blood pressure is outstanding.  Make no changes in her medication at this time.

## 2022-12-19 ENCOUNTER — Other Ambulatory Visit: Payer: Self-pay | Admitting: Family Medicine

## 2023-01-11 ENCOUNTER — Other Ambulatory Visit: Payer: Self-pay | Admitting: Family Medicine

## 2023-01-12 NOTE — Telephone Encounter (Signed)
Requested Prescriptions  Pending Prescriptions Disp Refills   valsartan (DIOVAN) 80 MG tablet [Pharmacy Med Name: VALSARTAN 80 MG TABLET] 90 tablet 1    Sig: TAKE 1 TABLET BY MOUTH EVERY DAY     Cardiovascular:  Angiotensin Receptor Blockers Failed - 01/11/2023 11:30 AM      Failed - Cr in normal range and within 180 days    Creat  Date Value Ref Range Status  12/09/2022 1.01 (H) 0.60 - 1.00 mg/dL Final         Failed - Valid encounter within last 6 months    Recent Outpatient Visits           1 year ago Chronic cough   [facility] Tanya Nones, Priscille Heidelberg, MD   1 year ago Hypothyroidism, unspecified type   [facility] Pickard, Priscille Heidelberg, MD   1 year ago Plantar keratosis, acquired   [facility] Donita Brooks, MD   2 years ago Benign essential HTN   [facility] Donita Brooks, MD   2 years ago Benign essential HTN   [facility] Pickard, Priscille Heidelberg, MD       Future Appointments             In 5 months Tanya Nones, Priscille Heidelberg, MD [facility], [facility]            Passed - K in normal range and within 180 days    Potassium  Date Value Ref Range Status  12/09/2022 3.9 3.5 - 5.3 mmol/L Final         Passed - Patient is not pregnant      Passed - Last BP in normal range    BP Readings from Last 1 Encounters:  12/15/22 122/72

## 2023-02-09 ENCOUNTER — Other Ambulatory Visit: Payer: Self-pay | Admitting: Family Medicine

## 2023-02-09 DIAGNOSIS — Z961 Presence of intraocular lens: Secondary | ICD-10-CM | POA: Diagnosis not present

## 2023-02-09 DIAGNOSIS — H21232 Degeneration of iris (pigmentary), left eye: Secondary | ICD-10-CM | POA: Diagnosis not present

## 2023-02-09 DIAGNOSIS — H26491 Other secondary cataract, right eye: Secondary | ICD-10-CM | POA: Diagnosis not present

## 2023-02-09 DIAGNOSIS — E039 Hypothyroidism, unspecified: Secondary | ICD-10-CM

## 2023-02-09 NOTE — Telephone Encounter (Signed)
Prescription Request  02/09/2023  LOV: 12/15/2022  What is the name of the medication or equipment? levothyroxine (SYNTHROID) 88 MCG tablet   Have you contacted your pharmacy to request a refill? Yes   Which pharmacy would you like this sent to?  [id number] [place], [place] - [address] [place] [address] Phone: [phone] Fax: [phone]    Patient notified that their request is being sent to the clinical staff for review and that they should receive a response within 2 business days.   Please advise at [facility] [phone]

## 2023-02-10 ENCOUNTER — Other Ambulatory Visit: Payer: Self-pay

## 2023-02-10 DIAGNOSIS — E039 Hypothyroidism, unspecified: Secondary | ICD-10-CM

## 2023-02-10 DIAGNOSIS — J329 Chronic sinusitis, unspecified: Secondary | ICD-10-CM

## 2023-02-10 MED ORDER — LEVOTHYROXINE SODIUM 88 MCG PO TABS: 88.0000 ug | ORAL_TABLET | Freq: Every day | ORAL | 0 refills | Status: DC

## 2023-02-10 MED ORDER — LEVOCETIRIZINE DIHYDROCHLORIDE 5 MG PO TABS: ORAL_TABLET | ORAL | 0 refills | Status: DC

## 2023-02-11 NOTE — Telephone Encounter (Signed)
Request was refilled 02/10/23, duplicate request.E-Prescribing Status: Receipt confirmed by pharmacy (02/10/2023 11:29 AM EDT).  Requested Prescriptions  Pending Prescriptions Disp Refills   levothyroxine (SYNTHROID) 88 MCG tablet 90 tablet 3    Sig: Take 1 tablet (88 mcg total) by mouth daily.     Endocrinology:  Hypothyroid Agents Failed - 02/09/2023  1:58 PM      Failed - Valid encounter within last 12 months    Recent Outpatient Visits           1 year ago Chronic cough   [facility] Tanya Nones, Priscille Heidelberg, MD   1 year ago Hypothyroidism, unspecified type   [facility] Tanya Nones, Priscille Heidelberg, MD   2 years ago Plantar keratosis, acquired   [facility] Donita Brooks, MD   2 years ago Benign essential HTN   [facility] Donita Brooks, MD   2 years ago Benign essential HTN   [facility] Pickard, Priscille Heidelberg, MD       Future Appointments             In 4 months Pickard, Priscille Heidelberg, MD  [facility], [facility]            Passed - TSH in normal range and within 360 days    TSH  Date Value Ref Range Status  12/09/2022 3.41 0.40 - 4.50 mIU/L Final

## 2023-02-23 ENCOUNTER — Other Ambulatory Visit: Payer: Self-pay | Admitting: Family Medicine

## 2023-02-23 NOTE — Telephone Encounter (Signed)
Prescription Request  02/23/2023  LOV: 12/15/2022  What is the name of the medication or equipment?   montelukast (SINGULAIR) 10 MG tablet  **90 day script requested**  Have you contacted your pharmacy to request a refill? Yes   Which pharmacy would you like this sent to?  [id number] [place], [place] - [address] [address] Phone: [phone] Fax: [phone]    Patient notified that their request is being sent to the clinical staff for review and that they should receive a response within 2 business days.   Please advise pharmacist.

## 2023-02-24 MED ORDER — MONTELUKAST SODIUM 10 MG PO TABS: 10.0000 mg | ORAL_TABLET | Freq: Every day | ORAL | 1 refills | Status: DC

## 2023-02-24 NOTE — Telephone Encounter (Signed)
Requested Prescriptions  Pending Prescriptions Disp Refills   montelukast (SINGULAIR) 10 MG tablet 90 tablet 1    Sig: Take 1 tablet (10 mg total) by mouth at bedtime.     Pulmonology:  Leukotriene Inhibitors Failed - 02/24/2023 11:48 AM      Failed - Valid encounter within last 12 months    Recent Outpatient Visits           1 year ago Chronic cough   [facility] Tanya Nones, Priscille Heidelberg, MD   1 year ago Hypothyroidism, unspecified type   [facility] Tanya Nones Priscille Heidelberg, MD   2 years ago Plantar keratosis, acquired   [facility] Medicine Donita Brooks, MD   2 years ago Benign essential HTN   [facility] Donita Brooks, MD   2 years ago Benign essential HTN   [facility] Pickard, Priscille Heidelberg, MD       Future Appointments             In 3 months Pickard, Priscille Heidelberg, MD [facility], [facility]

## 2023-02-24 NOTE — Telephone Encounter (Signed)
Patient has one pill left. Requesting for refill to be sent asap. Thank you.

## 2023-02-25 ENCOUNTER — Other Ambulatory Visit: Payer: Self-pay

## 2023-02-25 DIAGNOSIS — J209 Acute bronchitis, unspecified: Secondary | ICD-10-CM

## 2023-02-25 DIAGNOSIS — J329 Chronic sinusitis, unspecified: Secondary | ICD-10-CM

## 2023-02-25 MED ORDER — MONTELUKAST SODIUM 10 MG PO TABS: 10.0000 mg | ORAL_TABLET | Freq: Every day | ORAL | 1 refills | Status: DC

## 2023-02-26 DIAGNOSIS — H26491 Other secondary cataract, right eye: Secondary | ICD-10-CM | POA: Diagnosis not present

## 2023-04-08 ENCOUNTER — Other Ambulatory Visit: Payer: Self-pay | Admitting: Family Medicine

## 2023-04-08 DIAGNOSIS — E039 Hypothyroidism, unspecified: Secondary | ICD-10-CM

## 2023-04-08 MED ORDER — LEVOTHYROXINE SODIUM 88 MCG PO TABS: 88.0000 ug | ORAL_TABLET | Freq: Every day | ORAL | 0 refills | Status: DC

## 2023-04-08 NOTE — Telephone Encounter (Signed)
Prescription Request  04/08/2023  LOV: 12/15/2022  What is the name of the medication or equipment?   levothyroxine (SYNTHROID) 88 MCG tablet  **90 day script requested**  Have you contacted your pharmacy to request a refill? Yes   Which pharmacy would you like this sent to?    [id number] [place], [place] - [address] [facility] [address] [place] [address] Phone: [phone] Fax: [phone]   Patient notified that their request is being sent to the clinical staff for review and that they should receive a response within 2 business days.   Please advise pharmacist.

## 2023-04-08 NOTE — Telephone Encounter (Signed)
Requested Prescriptions  Pending Prescriptions Disp Refills   levothyroxine (SYNTHROID) 88 MCG tablet 90 tablet 0    Sig: Take 1 tablet (88 mcg total) by mouth daily.     Endocrinology:  Hypothyroid Agents Failed - 04/08/2023  3:54 PM      Failed - Valid encounter within last 12 months    Recent Outpatient Visits           1 year ago Chronic cough   [facility] Tanya Nones, Priscille Heidelberg, MD   1 year ago Hypothyroidism, unspecified type   [facility] Medicine Tanya Nones, Priscille Heidelberg, MD   2 years ago Plantar keratosis, acquired   [facility] Donita Brooks, MD   2 years ago Benign essential HTN   [facility] Donita Brooks, MD   2 years ago Benign essential HTN   [facility] Pickard, Priscille Heidelberg, MD       Future Appointments             In 2 months Pickard, Priscille Heidelberg, MD [facility]            Passed - TSH in normal range and within 360 days    TSH  Date Value Ref Range Status  12/09/2022 3.41 0.40 - 4.50 mIU/L Final

## 2023-05-07 ENCOUNTER — Other Ambulatory Visit: Payer: Self-pay | Admitting: Family Medicine

## 2023-05-07 DIAGNOSIS — J329 Chronic sinusitis, unspecified: Secondary | ICD-10-CM

## 2023-05-07 NOTE — Telephone Encounter (Signed)
Requested Prescriptions  Pending Prescriptions Disp Refills   levocetirizine (XYZAL) 5 MG tablet [Pharmacy Med Name: LEVOCETIRIZINE 5 MG TABLET] 90 tablet 0    Sig: TAKE 1 TABLET BY MOUTH EVERY DAY IN THE EVENING     Ear, Nose, and Throat:  Antihistamines - levocetirizine dihydrochloride Failed - 05/07/2023  2:52 AM      Failed - Cr in normal range and within 360 days    Creat  Date Value Ref Range Status  12/09/2022 1.01 (H) 0.60 - 1.00 mg/dL Final         Failed - Valid encounter within last 12 months    Recent Outpatient Visits           1 year ago Chronic cough   [facility] Pickard, Priscille Heidelberg, MD   1 year ago Hypothyroidism, unspecified type   [facility] Pickard, Priscille Heidelberg, MD   2 years ago Plantar keratosis, acquired   [facility] Donita Brooks, MD   2 years ago Benign essential HTN   [facility] Medicine Tanya Nones, Priscille Heidelberg, MD   2 years ago Benign essential HTN   [facility] Pickard, Priscille Heidelberg, MD       Future Appointments             In 1 month Pickard, Priscille Heidelberg, MD [facility], [facility]            Passed - eGFR is 10 or above and within 360 days    GFR, Est African American  Date Value Ref Range Status  12/11/2020 64 > OR = 60 mL/min/1.98m2 Final   GFR, Est Non African American  Date Value Ref Range Status  12/11/2020 55 (L) > OR = 60 mL/min/1.66m2 Final   eGFR  Date Value Ref Range Status  12/09/2022 58 (L) > OR = 60 mL/min/1.31m2 Final

## 2023-05-16 ENCOUNTER — Other Ambulatory Visit: Payer: Self-pay | Admitting: Family Medicine

## 2023-05-16 DIAGNOSIS — I10 Essential (primary) hypertension: Secondary | ICD-10-CM

## 2023-05-18 NOTE — Telephone Encounter (Signed)
Requested Prescriptions  Pending Prescriptions Disp Refills   valsartan (DIOVAN) 80 MG tablet [Pharmacy Med Name: VALSARTAN 80 MG TABLET] 90 tablet 0    Sig: TAKE 1 TABLET BY MOUTH EVERY DAY     Cardiovascular:  Angiotensin Receptor Blockers Failed - 05/16/2023  9:19 AM      Failed - Cr in normal range and within 180 days    Creat  Date Value Ref Range Status  12/09/2022 1.01 (H) 0.60 - 1.00 mg/dL Final         Failed - Valid encounter within last 6 months    Recent Outpatient Visits           1 year ago Chronic cough   [facility] Pickard, Priscille Heidelberg, MD   1 year ago Hypothyroidism, unspecified type   [facility] Pickard, Priscille Heidelberg, MD   2 years ago Plantar keratosis, acquired   [facility] Medicine Donita Brooks, MD   2 years ago Benign essential HTN   [facility] Medicine Tanya Nones, Priscille Heidelberg, MD   2 years ago Benign essential HTN   [facility] Medicine Pickard, Priscille Heidelberg, MD       Future Appointments             In 4 weeks Tanya Nones, Priscille Heidelberg, MD [facility]            Passed - K in normal range and within 180 days    Potassium  Date Value Ref Range Status  12/09/2022 3.9 3.5 - 5.3 mmol/L Final         Passed - Patient is not pregnant      Passed - Last BP in normal range    BP Readings from Last 1 Encounters:  12/15/22 122/72          hydrochlorothiazide (HYDRODIURIL) 25 MG tablet [Pharmacy Med Name: HYDROCHLOROTHIAZIDE 25 MG TAB] 90 tablet 0    Sig: TAKE 1 TABLET (25 MG TOTAL) BY MOUTH DAILY.     Cardiovascular: Diuretics - Thiazide Failed - 05/16/2023  9:19 AM      Failed - Cr in normal range and within 180 days    Creat  Date Value Ref Range Status  12/09/2022 1.01 (H) 0.60 - 1.00 mg/dL Final         Failed - Valid encounter within last 6 months    Recent Outpatient Visits           1 year ago Chronic cough   [facility] Tanya Nones, Priscille Heidelberg, MD   1 year ago Hypothyroidism, unspecified type   [facility] Medicine Donita Brooks, MD   2 years ago Plantar keratosis, acquired   [facility] Medicine Donita Brooks, MD   2 years ago Benign essential HTN   [facility] Medicine Donita Brooks, MD   2 years ago Benign essential HTN   [facility] Medicine Pickard, Priscille Heidelberg, MD       Future Appointments             In 4 weeks Pickard, Priscille Heidelberg, MD [facility]            Passed - K in normal range and within 180 days    Potassium  Date Value Ref Range Status  12/09/2022 3.9 3.5 - 5.3 mmol/L Final  Passed - Na in normal range and within 180 days    Sodium  Date Value Ref Range Status  12/09/2022 140 135 - 146 mmol/L Final         Passed - Last BP in normal range    BP Readings from Last 1 Encounters:  12/15/22 122/72

## 2023-05-20 ENCOUNTER — Telehealth: Payer: Self-pay | Admitting: Family Medicine

## 2023-05-20 ENCOUNTER — Other Ambulatory Visit: Payer: Self-pay

## 2023-05-20 DIAGNOSIS — E039 Hypothyroidism, unspecified: Secondary | ICD-10-CM

## 2023-05-20 MED ORDER — LEVOTHYROXINE SODIUM 88 MCG PO TABS: 88.0000 ug | ORAL_TABLET | Freq: Every day | ORAL | 1 refills | Status: DC

## 2023-05-20 NOTE — Telephone Encounter (Signed)
Prescription Request  05/20/2023  LOV: 12/15/2022  What is the name of the medication or equipment?   levothyroxine (SYNTHROID) 88 MCG tablet  **90 day script requested**  Have you contacted your pharmacy to request a refill? Yes   Which pharmacy would you like this sent to?  [id number] - [place], [place] - [address] [place] [address] Phone: [phone] Fax: [phone]    Patient notified that their request is being sent to the clinical staff for review and that they should receive a response within 2 business days.   Please advise pharmacist.

## 2023-05-26 DIAGNOSIS — Z471 Aftercare following joint replacement surgery: Secondary | ICD-10-CM | POA: Diagnosis not present

## 2023-05-26 DIAGNOSIS — Z96651 Presence of right artificial knee joint: Secondary | ICD-10-CM | POA: Diagnosis not present

## 2023-05-27 DIAGNOSIS — M25561 Pain in right knee: Secondary | ICD-10-CM | POA: Diagnosis not present

## 2023-06-01 DIAGNOSIS — M25561 Pain in right knee: Secondary | ICD-10-CM | POA: Diagnosis not present

## 2023-06-03 DIAGNOSIS — M25561 Pain in right knee: Secondary | ICD-10-CM | POA: Diagnosis not present

## 2023-06-05 DIAGNOSIS — M25561 Pain in right knee: Secondary | ICD-10-CM | POA: Diagnosis not present

## 2023-06-08 DIAGNOSIS — M25561 Pain in right knee: Secondary | ICD-10-CM | POA: Diagnosis not present

## 2023-06-10 DIAGNOSIS — M25561 Pain in right knee: Secondary | ICD-10-CM | POA: Diagnosis not present

## 2023-06-12 ENCOUNTER — Other Ambulatory Visit: Payer: Self-pay

## 2023-06-12 ENCOUNTER — Other Ambulatory Visit: Payer: Medicare HMO

## 2023-06-12 DIAGNOSIS — E785 Hyperlipidemia, unspecified: Secondary | ICD-10-CM | POA: Diagnosis not present

## 2023-06-12 DIAGNOSIS — I10 Essential (primary) hypertension: Secondary | ICD-10-CM | POA: Diagnosis not present

## 2023-06-12 DIAGNOSIS — M25561 Pain in right knee: Secondary | ICD-10-CM | POA: Diagnosis not present

## 2023-06-12 LAB — CBC WITH DIFFERENTIAL/PLATELET
Absolute Lymphocytes: 2118 {cells}/uL (ref 850–3900)
Absolute Monocytes: 518 {cells}/uL (ref 200–950)
Basophils Absolute: 58 {cells}/uL (ref 0–200)
Basophils Relative: 0.9 %
Eosinophils Absolute: 269 {cells}/uL (ref 15–500)
Eosinophils Relative: 4.2 %
HCT: 43.3 % (ref 35.0–45.0)
Hemoglobin: 14 g/dL (ref 11.7–15.5)
MCH: 30.3 pg (ref 27.0–33.0)
MCHC: 32.3 g/dL (ref 32.0–36.0)
MCV: 93.7 fL (ref 80.0–100.0)
MPV: 10 fL (ref 7.5–12.5)
Monocytes Relative: 8.1 %
Neutro Abs: 3437 {cells}/uL (ref 1500–7800)
Neutrophils Relative %: 53.7 %
Platelets: 260 10*3/uL (ref 140–400)
RBC: 4.62 10*6/uL (ref 3.80–5.10)
RDW: 12 % (ref 11.0–15.0)
Total Lymphocyte: 33.1 %
WBC: 6.4 10*3/uL (ref 3.8–10.8)

## 2023-06-12 NOTE — Addendum Note (Signed)
Addended by: Arta Silence on: 06/12/2023 08:40 AM   Modules accepted: Orders

## 2023-06-12 NOTE — Addendum Note (Signed)
Addended by: Arta Silence on: 06/12/2023 08:27 AM   Modules accepted: Orders

## 2023-06-13 LAB — COMPLETE METABOLIC PANEL WITH GFR
AG Ratio: 1.4 (calc) (ref 1.0–2.5)
ALT: 13 U/L (ref 6–29)
AST: 17 U/L (ref 10–35)
Albumin: 3.9 g/dL (ref 3.6–5.1)
Alkaline phosphatase (APISO): 55 U/L (ref 37–153)
BUN/Creatinine Ratio: 16 (calc) (ref 6–22)
BUN: 18 mg/dL (ref 7–25)
CO2: 29 mmol/L (ref 20–32)
Calcium: 9.5 mg/dL (ref 8.6–10.4)
Chloride: 103 mmol/L (ref 98–110)
Creat: 1.13 mg/dL — ABNORMAL HIGH (ref 0.60–1.00)
Globulin: 2.8 g/dL (ref 1.9–3.7)
Glucose, Bld: 113 mg/dL — ABNORMAL HIGH (ref 65–99)
Potassium: 4 mmol/L (ref 3.5–5.3)
Sodium: 141 mmol/L (ref 135–146)
Total Bilirubin: 0.3 mg/dL (ref 0.2–1.2)
Total Protein: 6.7 g/dL (ref 6.1–8.1)
eGFR: 50 mL/min/{1.73_m2} — ABNORMAL LOW (ref >=60)

## 2023-06-13 LAB — LIPID PANEL
Cholesterol: 195 mg/dL (ref <200)
HDL: 64 mg/dL (ref >=50)
LDL Cholesterol (Calc): 110 mg/dL — ABNORMAL HIGH
Non-HDL Cholesterol (Calc): 131 mg/dL — ABNORMAL HIGH (ref <130)
Total CHOL/HDL Ratio: 3 (calc) (ref <5.0)
Triglycerides: 103 mg/dL (ref <150)

## 2023-06-13 LAB — TSH: TSH: 6.63 m[IU]/L — ABNORMAL HIGH (ref 0.40–4.50)

## 2023-06-15 DIAGNOSIS — M25561 Pain in right knee: Secondary | ICD-10-CM | POA: Diagnosis not present

## 2023-06-16 ENCOUNTER — Ambulatory Visit (INDEPENDENT_AMBULATORY_CARE_PROVIDER_SITE_OTHER): Payer: Medicare HMO | Admitting: Family Medicine

## 2023-06-16 ENCOUNTER — Encounter: Payer: Self-pay | Admitting: Family Medicine

## 2023-06-16 VITALS — BP 120/72 | HR 75 | Temp 98.3°F | Ht 61.0 in | Wt 199.0 lb

## 2023-06-16 DIAGNOSIS — E039 Hypothyroidism, unspecified: Secondary | ICD-10-CM | POA: Diagnosis not present

## 2023-06-16 DIAGNOSIS — E785 Hyperlipidemia, unspecified: Secondary | ICD-10-CM | POA: Diagnosis not present

## 2023-06-16 DIAGNOSIS — I10 Essential (primary) hypertension: Secondary | ICD-10-CM | POA: Diagnosis not present

## 2023-06-16 DIAGNOSIS — Z23 Encounter for immunization: Secondary | ICD-10-CM

## 2023-06-16 MED ORDER — LEVOTHYROXINE SODIUM 100 MCG PO TABS: 100.0000 ug | ORAL_TABLET | Freq: Every day | ORAL | 3 refills | Status: DC

## 2023-06-16 MED ORDER — HYDROCODONE BIT-HOMATROP MBR 5-1.5 MG/5ML PO SOLN: 5.0000 mL | Freq: Three times a day (TID) | ORAL | 0 refills | Status: DC | PRN

## 2023-06-16 NOTE — Progress Notes (Signed)
Subjective:    [name], female    DOB: 14-Sep-1945, 77 y.o.   MRN: [id number]  Patient is here today for follow-up.  She is doing well.  She reports nonproductive cough x 2 days.  She denies any fever or chills.  She denies any shortness of breath.  She denies any pleurisy.  She does have some congestion.  She denies any sore throat or sinus pain.  She has not made any changes however her TSH has risen to 6.6.  Unfortunately her blood sugar is also risen to 113.  Her blood pressure today is well-controlled. Lab on 06/12/2023  Component Date Value Ref Range Status   TSH 06/12/2023 6.63 (H)  0.40 - 4.50 mIU/L Final   Glucose, Bld 06/12/2023 113 (H)  65 - 99 mg/dL Final   Comment: .            Fasting reference interval . For someone without known diabetes, a glucose value between 100 and 125 mg/dL is consistent with prediabetes and should be confirmed with a follow-up test. .    BUN 06/12/2023 18  7 - 25 mg/dL Final   Creat 46/96/2952 1.13 (H)  0.60 - 1.00 mg/dL Final   eGFR 84/13/2440 50 (L)  > OR = 60 mL/min/1.53m2 Final   BUN/Creatinine Ratio 06/12/2023 16  6 - 22 (calc) Final   Sodium 06/12/2023 141  135 - 146 mmol/L Final   Potassium 06/12/2023 4.0  3.5 - 5.3 mmol/L Final   Chloride 06/12/2023 103  98 - 110 mmol/L Final   CO2 06/12/2023 29  20 - 32 mmol/L Final   Calcium 06/12/2023 9.5  8.6 - 10.4 mg/dL Final   Total Protein 04/26/2535 6.7  6.1 - 8.1 g/dL Final   Albumin 64/40/3474 3.9  3.6 - 5.1 g/dL Final   Globulin 25/95/6387 2.8  1.9 - 3.7 g/dL (calc) Final   AG Ratio 06/12/2023 1.4  1.0 - 2.5 (calc) Final   Total Bilirubin 06/12/2023 0.3  0.2 - 1.2 mg/dL Final   Alkaline phosphatase (APISO) 06/12/2023 55  37 - 153 U/L Final   AST 06/12/2023 17  10 - 35 U/L Final   ALT 06/12/2023 13  6 - 29 U/L Final   Cholesterol 06/12/2023 195  <200 mg/dL Final   HDL 56/43/3295 64  > OR = 50 mg/dL Final   Triglycerides 18/84/1660 103  <150 mg/dL Final   LDL  Cholesterol (Calc) 06/12/2023 110 (H)  mg/dL (calc) Final   Comment: Reference range: <100 . Desirable range <100 mg/dL for primary prevention;   <70 mg/dL for patients with CHD or diabetic patients  with > or = 2 CHD risk factors. Marland Kitchen LDL-C is now calculated using the Martin-Hopkins  calculation, which is a validated novel method providing  better accuracy than the Friedewald equation in the  estimation of LDL-C.  Horald Pollen et al. Lenox Ahr. 6301;601(09): 2061-2068  (http://education.[facility].com/faq/FAQ164)    Total CHOL/HDL Ratio 06/12/2023 3.0  <3.2 (calc) Final   Non-HDL Cholesterol (Calc) 06/12/2023 131 (H)  <130 mg/dL (calc) Final   Comment: For patients with diabetes plus 1 major ASCVD risk  factor, treating to a non-HDL-C goal of <100 mg/dL  (LDL-C of <35 mg/dL) is considered a therapeutic  option.   Orders Only on 06/12/2023  Component Date Value Ref Range Status   WBC 06/12/2023 6.4  3.8 - 10.8 Thousand/uL Final   RBC 06/12/2023 4.62  3.80 - 5.10 Million/uL Final   Hemoglobin 06/12/2023 14.0  11.7 - 15.5 g/dL Final   HCT 04/26/2535 43.3  35.0 - 45.0 % Final   MCV 06/12/2023 93.7  80.0 - 100.0 fL Final   MCH 06/12/2023 30.3  27.0 - 33.0 pg Final   MCHC 06/12/2023 32.3  32.0 - 36.0 g/dL Final   Comment: For adults, a slight decrease in the calculated MCHC value (in the range of 30 to 32 g/dL) is most likely not clinically significant; however, it should be interpreted with caution in correlation with other red cell parameters and the patient's clinical condition.    RDW 06/12/2023 12.0  11.0 - 15.0 % Final   Platelets 06/12/2023 260  140 - 400 Thousand/uL Final   MPV 06/12/2023 10.0  7.5 - 12.5 fL Final   Neutro Abs 06/12/2023 3,437  1,500 - 7,800 cells/uL Final   Absolute Lymphocytes 06/12/2023 2,118  850 - 3,900 cells/uL Final   Absolute Monocytes 06/12/2023 518  200 - 950 cells/uL Final   Eosinophils Absolute 06/12/2023 269  15 - 500 cells/uL Final   Basophils  Absolute 06/12/2023 58  0 - 200 cells/uL Final   Neutrophils Relative % 06/12/2023 53.7  % Final   Total Lymphocyte 06/12/2023 33.1  % Final   Monocytes Relative 06/12/2023 8.1  % Final   Eosinophils Relative 06/12/2023 4.2  % Final   Basophils Relative 06/12/2023 0.9  % Final    Past Medical History:  Diagnosis Date   CKD (chronic kidney disease) stage 3, GFR 30-59 ml/min (HCC)    Hypertension    Thyroid disease    hypothyroid   Past Surgical History:  Procedure Laterality Date   ABDOMINAL HYSTERECTOMY     CATARACT EXTRACTION     KNEE SURGERY Left    11 surgery    SHOULDER SURGERY Bilateral    Current Outpatient Medications on File Prior to Visit  Medication Sig Dispense Refill   aspirin 81 MG tablet Take 81 mg by mouth daily.     BIOTIN PO Take 1 tablet by mouth daily.     docusate sodium (COLACE) 100 MG capsule Take 200 mg by mouth at bedtime.      hydrochlorothiazide (HYDRODIURIL) 25 MG tablet TAKE 1 TABLET (25 MG TOTAL) BY MOUTH DAILY. 90 tablet 0   levocetirizine (XYZAL) 5 MG tablet TAKE 1 TABLET BY MOUTH EVERY DAY IN THE EVENING 90 tablet 0   levothyroxine (SYNTHROID) 88 MCG tablet Take 1 tablet (88 mcg total) by mouth daily. 90 tablet 1   montelukast (SINGULAIR) 10 MG tablet Take 1 tablet (10 mg total) by mouth at bedtime. 90 tablet 1   valsartan (DIOVAN) 80 MG tablet TAKE 1 TABLET BY MOUTH EVERY DAY 90 tablet 0   No current facility-administered medications on file prior to visit.   Allergies  Allergen Reactions   Erythromycin Base Other (See Comments)   Penicillins    Tylenol [Acetaminophen]    Cortisone Other (See Comments) and Rash    given injections-skin peels/redness   Social History   Socioeconomic History   Marital status: Married    Spouse name: Not on file   Number of children: Not on file   Years of education: Not on file   Highest education level: Not on file  Occupational History   Not on file  Tobacco Use   Smoking status: Never    Smokeless tobacco: Never  Substance and Sexual Activity   Alcohol use: Never   Drug use: Never   Sexual activity: Not on file  Other  Topics Concern   Not on file  Social History Narrative   Not on file   Social Drivers of Health   Financial Resource Strain: Low Risk  (05/06/2022)   [facility]   Overall Financial Resource Strain (CARDIA)    Difficulty of Paying Living Expenses: Not hard at all  Food Insecurity: No Food Insecurity (05/06/2022)   [facility]   Food Insecurity    Within the past 12 months, you worried that your food would run out before you got the money to buy more.: 1    Within the past 12 months, the food you bought just didn't last and you didn't have money to get more.: 1  Transportation Needs: No Transportation Needs (05/06/2022)   [facility]   Transportation Needs    In the past 12 months, has lack of transportation kept you from medical appointments or from getting medications?: 2    In the past 12 months, has lack of transportation kept you from meetings, work, or from getting things needed for daily living?: 2  Physical Activity: Inactive (11/29/2021)   Exercise Vital Sign    Days of Exercise per Week: 0 days    Minutes of Exercise per Session: 0 min  Stress: No Stress Concern Present (11/29/2021)   [employer] of Occupational Health - Occupational Stress Questionnaire    Feeling of Stress : Not at all  Social Connections: Socially Integrated (11/29/2021)   Social Connection and Isolation Panel [NHANES]    Frequency of Communication with Friends and Family: More than three times a week    Frequency of Social Gatherings with Friends and Family: More than three times a week    Attends Religious Services: More than 4 times per year    Active Member of [employer] or Organizations: Yes    Attends Banker Meetings: More than 4 times per year    Marital Status: Married   Catering manager Violence: Not At Risk (02/14/2022)   [facility]   [facility] Safety    Threatened: Not on file    Insulted: Not on file    Physically Hurt : Not on file    Scream: Not on file      Review of Systems  All other systems reviewed and are negative.      Objective:   Physical Exam Vitals reviewed.  Constitutional:      Appearance: She is well-developed.  HENT:     Nose: Nose normal.  Neck:     Thyroid: No thyromegaly.  Cardiovascular:     Rate and Rhythm: Normal rate and regular rhythm.     Heart sounds: Normal heart sounds.  Pulmonary:     Effort: Pulmonary effort is normal. No respiratory distress.     Breath sounds: Normal breath sounds. No wheezing or rales.  Abdominal:     General: Bowel sounds are normal. There is no distension.     Palpations: Abdomen is soft.     Tenderness: There is no abdominal tenderness. There is no guarding or rebound.  Musculoskeletal:     Cervical back: Neck supple.  Lymphadenopathy:     Cervical: No cervical adenopathy.           Assessment & Plan:  Essential hypertension  Hyperlipidemia, unspecified hyperlipidemia type  Hypothyroidism, unspecified type Patient has a viral upper respiratory infection.  Treat her cough with Hycodan 1 teaspoon every 6 hours as needed for cough and tincture of time.  Blood pressure today is excellent.  Cholesterol is acceptable.  Increase levothyroxine to 100 mcg a day and recheck in 6 months.  Recommended a low carbohydrate diet, exercise, and weight loss to address her hyperglycemia

## 2023-06-16 NOTE — Addendum Note (Signed)
Addended by: Venia Carbon K on: 06/16/2023 11:10 AM   Modules accepted: Orders

## 2023-06-17 DIAGNOSIS — M25561 Pain in right knee: Secondary | ICD-10-CM | POA: Diagnosis not present

## 2023-06-19 DIAGNOSIS — M25561 Pain in right knee: Secondary | ICD-10-CM | POA: Diagnosis not present

## 2023-06-22 DIAGNOSIS — M25561 Pain in right knee: Secondary | ICD-10-CM | POA: Diagnosis not present

## 2023-06-23 DIAGNOSIS — M25561 Pain in right knee: Secondary | ICD-10-CM | POA: Diagnosis not present

## 2023-06-29 DIAGNOSIS — M25561 Pain in right knee: Secondary | ICD-10-CM | POA: Diagnosis not present

## 2023-06-30 DIAGNOSIS — M25561 Pain in right knee: Secondary | ICD-10-CM | POA: Diagnosis not present

## 2023-07-02 DIAGNOSIS — M25561 Pain in right knee: Secondary | ICD-10-CM | POA: Diagnosis not present

## 2023-07-03 DIAGNOSIS — M25561 Pain in right knee: Secondary | ICD-10-CM | POA: Diagnosis not present

## 2023-08-12 ENCOUNTER — Other Ambulatory Visit: Payer: Self-pay

## 2023-08-12 DIAGNOSIS — J329 Chronic sinusitis, unspecified: Secondary | ICD-10-CM

## 2023-08-12 MED ORDER — LEVOCETIRIZINE DIHYDROCHLORIDE 5 MG PO TABS: ORAL_TABLET | ORAL | 1 refills | Status: DC

## 2023-08-13 ENCOUNTER — Other Ambulatory Visit: Payer: Self-pay | Admitting: Family Medicine

## 2023-08-13 DIAGNOSIS — I10 Essential (primary) hypertension: Secondary | ICD-10-CM

## 2023-08-13 MED ORDER — LEVOTHYROXINE SODIUM 100 MCG PO TABS: 100.0000 ug | ORAL_TABLET | Freq: Every day | ORAL | 3 refills | Status: AC

## 2023-08-13 NOTE — Telephone Encounter (Addendum)
Prescription Request  08/13/2023  LOV: 06/16/2023  What is the name of the medication or equipment?   levothyroxine (SYNTHROID) 100 MCG tablet   hydrochlorothiazide (HYDRODIURIL) 25 MG tablet   Have you contacted your pharmacy to request a refill? Yes   Which pharmacy would you like this sent to?  [id number] [place], [place] - [id number] [place] [address] Phone: [phone] Fax: [phone]    Patient notified that their request is being sent to the clinical staff for review and that they should receive a response within 2 business days.   Please advise pharmacist.

## 2023-08-17 MED ORDER — HYDROCHLOROTHIAZIDE 25 MG PO TABS: 25.0000 mg | ORAL_TABLET | Freq: Every day | ORAL | 1 refills | Status: DC

## 2023-08-17 NOTE — Telephone Encounter (Signed)
Requested Prescriptions  Pending Prescriptions Disp Refills   hydrochlorothiazide (HYDRODIURIL) 25 MG tablet 90 tablet 1    Sig: Take 1 tablet (25 mg total) by mouth daily.     Cardiovascular: Diuretics - Thiazide Failed - 08/17/2023  9:55 AM      Failed - Cr in normal range and within 180 days    Creat  Date Value Ref Range Status  06/12/2023 1.13 (H) 0.60 - 1.00 mg/dL Final         Failed - Valid encounter within last 6 months    Recent Outpatient Visits           1 year ago Chronic cough   [facility] Donita Brooks, MD   2 years ago Hypothyroidism, unspecified type   [facility] Donita Brooks, MD   2 years ago Plantar keratosis, acquired   [facility] Donita Brooks, MD   2 years ago Benign essential HTN   [facility] Donita Brooks, MD   3 years ago Benign essential HTN   [facility] Pickard, Priscille Heidelberg, MD              Passed - K in normal range and within 180 days    Potassium  Date Value Ref Range Status  06/12/2023 4.0 3.5 - 5.3 mmol/L Final         Passed - Na in normal range and within 180 days    Sodium  Date Value Ref Range Status  06/12/2023 141 135 - 146 mmol/L Final         Passed - Last BP in normal range    BP Readings from Last 1 Encounters:  06/16/23 120/72         Signed Prescriptions Disp Refills   levothyroxine (SYNTHROID) 100 MCG tablet 90 tablet 3    Sig: Take 1 tablet (100 mcg total) by mouth daily.     There is no refill protocol information for this order

## 2023-08-17 NOTE — Addendum Note (Signed)
Addended by: Wilford Corner on: 08/17/2023 09:54 AM   Modules accepted: Orders

## 2023-09-03 ENCOUNTER — Other Ambulatory Visit: Payer: Self-pay | Admitting: Family Medicine

## 2023-09-03 ENCOUNTER — Encounter

## 2023-09-03 DIAGNOSIS — Z0000 Encounter for general adult medical examination without abnormal findings: Secondary | ICD-10-CM

## 2023-09-16 ENCOUNTER — Ambulatory Visit
Admission: RE | Admit: 2023-09-16 | Discharge: 2023-09-16 | Disposition: A | Discharge status: Home / Self Care | Source: Ambulatory Visit | Attending: Family Medicine | Admitting: Family Medicine

## 2023-09-16 DIAGNOSIS — Z1231 Encounter for screening mammogram for malignant neoplasm of breast: Secondary | ICD-10-CM | POA: Diagnosis not present

## 2023-09-16 DIAGNOSIS — Z0000 Encounter for general adult medical examination without abnormal findings: Secondary | ICD-10-CM

## 2023-09-25 ENCOUNTER — Other Ambulatory Visit: Payer: Self-pay

## 2023-09-25 ENCOUNTER — Telehealth: Payer: Self-pay | Admitting: Family Medicine

## 2023-09-25 MED ORDER — VALSARTAN 80 MG PO TABS: 80.0000 mg | ORAL_TABLET | Freq: Every day | ORAL | 0 refills | Status: DC

## 2023-09-25 NOTE — Telephone Encounter (Signed)
 Prescription Request  09/25/2023  LOV: 06/16/2023  What is the name of the medication or equipment?   valsartan (DIOVAN) 80 MG tablet   Have you contacted your pharmacy to request a refill? Yes   Which pharmacy would you like this sent to?  [id number] [place], [place] - [address] [place] [address] Phone: [phone] Fax: [phone]    Patient notified that their request is being sent to the clinical staff for review and that they should receive a response within 2 business days.   Please advise pharmacist.

## 2023-11-10 ENCOUNTER — Other Ambulatory Visit: Payer: Self-pay

## 2023-11-10 DIAGNOSIS — J209 Acute bronchitis, unspecified: Secondary | ICD-10-CM

## 2023-11-10 DIAGNOSIS — J329 Chronic sinusitis, unspecified: Secondary | ICD-10-CM

## 2023-11-10 NOTE — Telephone Encounter (Signed)
 Prescription Request  11/10/2023  LOV: 06/16/23  What is the name of the medication or equipment? montelukast  (SINGULAIR ) 10 MG tablet [[id number]]   Have you contacted your pharmacy to request a refill? Yes   Which pharmacy would you like this sent to?  [id number] [place], [place] - [address] [id number] [place] [address] Phone: [phone] Fax: [phone]    Patient notified that their request is being sent to the clinical staff for review and that they should receive a response within 2 business days.   Please advise at [facility] [phone]

## 2023-11-12 MED ORDER — MONTELUKAST SODIUM 10 MG PO TABS: 10.0000 mg | ORAL_TABLET | Freq: Every day | ORAL | 1 refills | Status: DC

## 2023-11-12 NOTE — Telephone Encounter (Signed)
 Requested Prescriptions  Pending Prescriptions Disp Refills   montelukast  (SINGULAIR ) 10 MG tablet 90 tablet 1    Sig: Take 1 tablet (10 mg total) by mouth at bedtime.     Pulmonology:  Leukotriene Inhibitors Passed - 11/12/2023  9:31 AM      Passed - Valid encounter within last 12 months    Recent Outpatient Visits           4 months ago Essential hypertension   [place] [facility] Austine Lefort, MD   11 months ago Benign essential HTN   [place] [facility] Pickard, Cisco Crest, MD   1 year ago Benign essential HTN   [place] [facility] Cheril Cork, Cisco Crest, MD   1 year ago Rhinosinusitis   [place] [facility] Austine Lefort, MD   1 year ago Hypothyroidism, unspecified type   [place] [facility] Pickard, Cisco Crest, MD

## 2023-11-17 DIAGNOSIS — Z96651 Presence of right artificial knee joint: Secondary | ICD-10-CM | POA: Diagnosis not present

## 2023-11-17 DIAGNOSIS — Z471 Aftercare following joint replacement surgery: Secondary | ICD-10-CM | POA: Diagnosis not present

## 2023-11-17 DIAGNOSIS — M7051 Other bursitis of knee, right knee: Secondary | ICD-10-CM | POA: Diagnosis not present

## 2023-11-19 DIAGNOSIS — M25562 Pain in left knee: Secondary | ICD-10-CM | POA: Diagnosis not present

## 2023-11-19 DIAGNOSIS — S8002XA Contusion of left knee, initial encounter: Secondary | ICD-10-CM | POA: Diagnosis not present

## 2023-11-19 DIAGNOSIS — Y92410 Unspecified street and highway as the place of occurrence of the external cause: Secondary | ICD-10-CM | POA: Diagnosis not present

## 2023-11-19 DIAGNOSIS — Z96652 Presence of left artificial knee joint: Secondary | ICD-10-CM | POA: Diagnosis not present

## 2023-12-01 ENCOUNTER — Telehealth: Payer: Self-pay

## 2023-12-01 NOTE — Telephone Encounter (Signed)
 Copied from CRM [id number]. Topic: Clinical - Request for Lab/Test Order >> Dec 01, 2023  2:55 PM Ivette P wrote: Reason for CRM: Pt called in because has upcoming appt on 06/09 and pt would [facility] lab work done before, Did not see lab work order, all showing are expired. Please put new lab order in and reach out to pt to schedule lab   [phone]

## 2023-12-03 ENCOUNTER — Other Ambulatory Visit

## 2023-12-03 DIAGNOSIS — N183 Chronic kidney disease, stage 3 unspecified: Secondary | ICD-10-CM

## 2023-12-03 DIAGNOSIS — E039 Hypothyroidism, unspecified: Secondary | ICD-10-CM

## 2023-12-03 DIAGNOSIS — E785 Hyperlipidemia, unspecified: Secondary | ICD-10-CM | POA: Diagnosis not present

## 2023-12-03 DIAGNOSIS — I10 Essential (primary) hypertension: Secondary | ICD-10-CM | POA: Diagnosis not present

## 2023-12-04 ENCOUNTER — Ambulatory Visit: Payer: Self-pay | Admitting: Family Medicine

## 2023-12-04 LAB — CBC WITH DIFFERENTIAL/PLATELET
Absolute Lymphocytes: 1918 {cells}/uL (ref 850–3900)
Absolute Monocytes: 444 {cells}/uL (ref 200–950)
Basophils Absolute: 71 {cells}/uL (ref 0–200)
Basophils Relative: 1.4 %
Eosinophils Absolute: 189 {cells}/uL (ref 15–500)
Eosinophils Relative: 3.7 %
HCT: 44.4 % (ref 35.0–45.0)
Hemoglobin: 14.2 g/dL (ref 11.7–15.5)
MCH: 30.3 pg (ref 27.0–33.0)
MCHC: 32 g/dL (ref 32.0–36.0)
MCV: 94.7 fL (ref 80.0–100.0)
MPV: 10.1 fL (ref 7.5–12.5)
Monocytes Relative: 8.7 %
Neutro Abs: 2479 {cells}/uL (ref 1500–7800)
Neutrophils Relative %: 48.6 %
Platelets: 273 10*3/uL (ref 140–400)
RBC: 4.69 10*6/uL (ref 3.80–5.10)
RDW: 12.2 % (ref 11.0–15.0)
Total Lymphocyte: 37.6 %
WBC: 5.1 10*3/uL (ref 3.8–10.8)

## 2023-12-04 LAB — COMPLETE METABOLIC PANEL WITHOUT GFR
AG Ratio: 1.6 (calc) (ref 1.0–2.5)
ALT: 14 U/L (ref 6–29)
AST: 18 U/L (ref 10–35)
Albumin: 4 g/dL (ref 3.6–5.1)
Alkaline phosphatase (APISO): 54 U/L (ref 37–153)
BUN/Creatinine Ratio: 14 (calc) (ref 6–22)
BUN: 15 mg/dL (ref 7–25)
CO2: 28 mmol/L (ref 20–32)
Calcium: 9.4 mg/dL (ref 8.6–10.4)
Chloride: 102 mmol/L (ref 98–110)
Creat: 1.04 mg/dL — ABNORMAL HIGH (ref 0.60–1.00)
Globulin: 2.5 g/dL (ref 1.9–3.7)
Glucose, Bld: 110 mg/dL — ABNORMAL HIGH (ref 65–99)
Potassium: 4 mmol/L (ref 3.5–5.3)
Sodium: 139 mmol/L (ref 135–146)
Total Bilirubin: 0.7 mg/dL (ref 0.2–1.2)
Total Protein: 6.5 g/dL (ref 6.1–8.1)

## 2023-12-04 LAB — LIPID PANEL
Cholesterol: 172 mg/dL (ref <200)
HDL: 59 mg/dL (ref >=50)
LDL Cholesterol (Calc): 95 mg/dL
Non-HDL Cholesterol (Calc): 113 mg/dL (ref <130)
Total CHOL/HDL Ratio: 2.9 (calc) (ref <5.0)
Triglycerides: 87 mg/dL (ref <150)

## 2023-12-04 LAB — TSH: TSH: 3.14 m[IU]/L (ref 0.40–4.50)

## 2023-12-07 ENCOUNTER — Ambulatory Visit: Admitting: Family Medicine

## 2023-12-07 VITALS — BP 142/70 | HR 70 | Temp 98.6°F | Resp 16 | Wt 194.0 lb

## 2023-12-07 DIAGNOSIS — E039 Hypothyroidism, unspecified: Secondary | ICD-10-CM

## 2023-12-07 DIAGNOSIS — I10 Essential (primary) hypertension: Secondary | ICD-10-CM | POA: Diagnosis not present

## 2023-12-07 MED ORDER — VALSARTAN 160 MG PO TABS: 160.0000 mg | ORAL_TABLET | Freq: Every day | ORAL | 3 refills | Status: DC

## 2023-12-07 NOTE — Progress Notes (Signed)
 Subjective:    [name], female    DOB: [birth year], 78 y.o.   MRN: [id number]  Patient is here today for follow-up.  She is doing well.  Blood pressure slightly elevated at 142/70.  Patient states her blood pressure at home is in the high 130s usually around 138.  She has mild stage IIIa chronic kidney disease.  She denies any chest pain shortness of breath or dyspnea on exertion. Lab on 12/03/2023  Component Date Value Ref Range Status   WBC 12/03/2023 5.1  3.8 - 10.8 Thousand/uL Final   RBC 12/03/2023 4.69  3.80 - 5.10 Million/uL Final   Hemoglobin 12/03/2023 14.2  11.7 - 15.5 g/dL Final   HCT 40/98/1191 44.4  35.0 - 45.0 % Final   MCV 12/03/2023 94.7  80.0 - 100.0 fL Final   MCH 12/03/2023 30.3  27.0 - 33.0 pg Final   MCHC 12/03/2023 32.0  32.0 - 36.0 g/dL Final   Comment: For adults, a slight decrease in the calculated MCHC value (in the range of 30 to 32 g/dL) is most likely not clinically significant; however, it should be interpreted with caution in correlation with other red cell parameters and the patient's clinical condition.    RDW 12/03/2023 12.2  11.0 - 15.0 % Final   Platelets 12/03/2023 273  140 - 400 Thousand/uL Final   MPV 12/03/2023 10.1  7.5 - 12.5 fL Final   Neutro Abs 12/03/2023 2,479  1,500 - 7,800 cells/uL Final   Absolute Lymphocytes 12/03/2023 1,918  850 - 3,900 cells/uL Final   Absolute Monocytes 12/03/2023 444  200 - 950 cells/uL Final   Eosinophils Absolute 12/03/2023 189  15 - 500 cells/uL Final   Basophils Absolute 12/03/2023 71  0 - 200 cells/uL Final   Neutrophils Relative % 12/03/2023 48.6  % Final   Total Lymphocyte 12/03/2023 37.6  % Final   Monocytes Relative 12/03/2023 8.7  % Final   Eosinophils Relative 12/03/2023 3.7  % Final   Basophils Relative 12/03/2023 1.4  % Final   Glucose, Bld 12/03/2023 110 (H)  65 - 99 mg/dL Final   Comment: .            Fasting reference interval . For someone without known diabetes, a  glucose value between 100 and 125 mg/dL is consistent with prediabetes and should be confirmed with a follow-up test. .    BUN 12/03/2023 15  7 - 25 mg/dL Final   Creat 47/82/9562 1.04 (H)  0.60 - 1.00 mg/dL Final   BUN/Creatinine Ratio 12/03/2023 14  6 - 22 (calc) Final   Sodium 12/03/2023 139  135 - 146 mmol/L Final   Potassium 12/03/2023 4.0  3.5 - 5.3 mmol/L Final   Chloride 12/03/2023 102  98 - 110 mmol/L Final   CO2 12/03/2023 28  20 - 32 mmol/L Final   Calcium 12/03/2023 9.4  8.6 - 10.4 mg/dL Final   Total Protein 13/01/6577 6.5  6.1 - 8.1 g/dL Final   Albumin 46/96/2952 4.0  3.6 - 5.1 g/dL Final   Globulin 84/13/2440 2.5  1.9 - 3.7 g/dL (calc) Final   AG Ratio 12/03/2023 1.6  1.0 - 2.5 (calc) Final   Total Bilirubin 12/03/2023 0.7  0.2 - 1.2 mg/dL Final   Alkaline phosphatase (APISO) 12/03/2023 54  37 - 153 U/L Final   AST 12/03/2023 18  10 - 35 U/L Final   ALT 12/03/2023 14  6 - 29 U/L Final   Cholesterol 12/03/2023  172  <200 mg/dL Final   HDL 16/03/9603 59  > OR = 50 mg/dL Final   Triglycerides 54/02/8118 87  <150 mg/dL Final   LDL Cholesterol (Calc) 12/03/2023 95  mg/dL (calc) Final   Comment: Reference range: <100 . Desirable range <100 mg/dL for primary prevention;   <70 mg/dL for patients with CHD or diabetic patients  with > or = 2 CHD risk factors. Aaron Aas LDL-C is now calculated using the Martin-Hopkins  calculation, which is a validated novel method providing  better accuracy than the Friedewald equation in the  estimation of LDL-C.  Melinda Sprawls et al. Erroll Heard. 1478;295(62): 2061-2068  (http://education.[facility].com/faq/FAQ164)    Total CHOL/HDL Ratio 12/03/2023 2.9  <1.3 (calc) Final   Non-HDL Cholesterol (Calc) 12/03/2023 113  <130 mg/dL (calc) Final   Comment: For patients with diabetes plus 1 major ASCVD risk  factor, treating to a non-HDL-C goal of <100 mg/dL  (LDL-C of <08 mg/dL) is considered a therapeutic  option.    TSH 12/03/2023 3.14  0.40 -  4.50 mIU/L Final    Past Medical History:  Diagnosis Date   CKD (chronic kidney disease) stage 3, GFR 30-59 ml/min (HCC)    Hypertension    Thyroid  disease    hypothyroid   Past Surgical History:  Procedure Laterality Date   ABDOMINAL HYSTERECTOMY     CATARACT EXTRACTION     KNEE SURGERY Left    11 surgery    SHOULDER SURGERY Bilateral    Current Outpatient Medications on File Prior to Visit  Medication Sig Dispense Refill   aspirin 81 MG tablet Take 81 mg by mouth daily.     BIOTIN PO Take 1 tablet by mouth daily.     docusate sodium (COLACE) 100 MG capsule Take 200 mg by mouth at bedtime.      hydrochlorothiazide  (HYDRODIURIL ) 25 MG tablet Take 1 tablet (25 mg total) by mouth daily. 90 tablet 1   HYDROcodone  bit-homatropine (HYCODAN) 5-1.5 MG/5ML syrup Take 5 mLs by mouth every 8 (eight) hours as needed for cough. 120 mL 0   levocetirizine (XYZAL ) 5 MG tablet TAKE 1 TABLET BY MOUTH EVERY DAY IN THE EVENING 90 tablet 1   levothyroxine  (SYNTHROID ) 100 MCG tablet Take 1 tablet (100 mcg total) by mouth daily. 90 tablet 3   montelukast  (SINGULAIR ) 10 MG tablet Take 1 tablet (10 mg total) by mouth at bedtime. 90 tablet 1   valsartan  (DIOVAN ) 80 MG tablet Take 1 tablet (80 mg total) by mouth daily. 90 tablet 0   No current facility-administered medications on file prior to visit.   Allergies  Allergen Reactions   Erythromycin Base Other (See Comments)   Penicillins    Tylenol  [Acetaminophen ]    Cortisone Other (See Comments) and Rash    given injections-skin peels/redness   Social History   Socioeconomic History   Marital status: Married    Spouse name: Not on file   Number of children: Not on file   Years of education: Not on file   Highest education level: Not on file  Occupational History   Not on file  Tobacco Use   Smoking status: Never   Smokeless tobacco: Never  Substance and Sexual Activity   Alcohol use: Never   Drug use: Never   Sexual activity: Not on  file  Other Topics Concern   Not on file  Social History Narrative   Not on file   Social Drivers of Health   Financial Resource Strain: Low Risk  (  05/06/2022)   [facility]   Overall Financial Resource Strain (CARDIA)    Difficulty of Paying Living Expenses: Not hard at all  Food Insecurity: No Food Insecurity (05/06/2022)   [facility]   Food Insecurity    Within the past 12 months, you worried that your food would run out before you got the money to buy more.: 1    Within the past 12 months, the food you bought just didn't last and you didn't have money to get more.: 1  Transportation Needs: No Transportation Needs (05/06/2022)   [facility]   Transportation Needs    In the past 12 months, has lack of transportation kept you from medical appointments or from getting medications?: 2    In the past 12 months, has lack of transportation kept you from meetings, work, or from getting things needed for daily living?: 2  Physical Activity: Inactive (11/29/2021)   Exercise Vital Sign    Days of Exercise per Week: 0 days    Minutes of Exercise per Session: 0 min  Stress: No Stress Concern Present (11/29/2021)   [employer] of Occupational Health - Occupational Stress Questionnaire    Feeling of Stress : Not at all  Social Connections: Socially Integrated (11/29/2021)   Social Connection and Isolation Panel [NHANES]    Frequency of Communication with Friends and Family: More than three times a week    Frequency of Social Gatherings with Friends and Family: More than three times a week    Attends Religious Services: More than 4 times per year    Active Member of [employer] or Organizations: Yes    Attends Banker Meetings: More than 4 times per year    Marital Status: Married  Catering manager Violence: Not At Risk (02/14/2022)   [facility]   [facility] Safety     Threatened: Not on file    Insulted: Not on file    Physically Hurt : Not on file    Scream: Not on file      Review of Systems  All other systems reviewed and are negative.      Objective:   Physical Exam Vitals reviewed.  Constitutional:      Appearance: She is well-developed.  HENT:     Nose: Nose normal.  Neck:     Thyroid : No thyromegaly.  Cardiovascular:     Rate and Rhythm: Normal rate and regular rhythm.     Heart sounds: Normal heart sounds.  Pulmonary:     Effort: Pulmonary effort is normal. No respiratory distress.     Breath sounds: Normal breath sounds. No wheezing or rales.  Abdominal:     General: Bowel sounds are normal. There is no distension.     Palpations: Abdomen is soft.     Tenderness: There is no abdominal tenderness. There is no guarding or rebound.  Musculoskeletal:     Cervical back: Neck supple.  Lymphadenopathy:     Cervical: No cervical adenopathy.           Assessment & Plan:  Essential hypertension  Hypothyroidism, unspecified type I am very happy with her lab work except for her blood sugar.  I have recommended a Mediterranean diet and weight loss.  Blood pressure slightly high so we will increase valsartan  to 160 mg daily to help prevent further kidney damage.  Otherwise no changes in medication at this time.

## 2023-12-12 ENCOUNTER — Encounter: Payer: Self-pay | Admitting: Family Medicine

## 2023-12-15 ENCOUNTER — Ambulatory Visit: Payer: Medicare HMO | Admitting: Family Medicine

## 2023-12-20 ENCOUNTER — Other Ambulatory Visit: Payer: Self-pay | Admitting: Family Medicine

## 2024-01-07 ENCOUNTER — Telehealth: Payer: Self-pay | Admitting: Family Medicine

## 2024-01-07 NOTE — Telephone Encounter (Signed)
 Patient dropped off application for disability license plate for provider to complete and sign.   Form placed in green folder on provider's desk.   Please advise patient at [phone] when form ready for pickup.

## 2024-02-12 ENCOUNTER — Telehealth: Payer: Self-pay | Admitting: Family Medicine

## 2024-02-12 ENCOUNTER — Other Ambulatory Visit: Payer: Self-pay

## 2024-02-12 DIAGNOSIS — J329 Chronic sinusitis, unspecified: Secondary | ICD-10-CM

## 2024-02-12 MED ORDER — LEVOCETIRIZINE DIHYDROCHLORIDE 5 MG PO TABS: ORAL_TABLET | ORAL | 1 refills | Status: DC

## 2024-02-12 NOTE — Telephone Encounter (Signed)
 Prescription Request  02/12/2024  LOV: 12/07/2023  What is the name of the medication or equipment?   levocetirizine (XYZAL ) 5 MG tablet  **90 day script requested**  Have you contacted your pharmacy to request a refill? Yes   Which pharmacy would you like this sent to?  [id number] [place] [place] - [address] [place] [address] Phone: [phone] Fax: [phone]    Patient notified that their request is being sent to the clinical staff for review and that they should receive a response within 2 business days.   Please advise pharmacist.

## 2024-03-02 DIAGNOSIS — H21232 Degeneration of iris (pigmentary), left eye: Secondary | ICD-10-CM | POA: Diagnosis not present

## 2024-03-02 DIAGNOSIS — Z961 Presence of intraocular lens: Secondary | ICD-10-CM | POA: Diagnosis not present

## 2024-03-11 ENCOUNTER — Telehealth: Payer: Self-pay | Admitting: Family Medicine

## 2024-03-11 ENCOUNTER — Other Ambulatory Visit: Payer: Self-pay

## 2024-03-11 DIAGNOSIS — I10 Essential (primary) hypertension: Secondary | ICD-10-CM

## 2024-03-11 DIAGNOSIS — E039 Hypothyroidism, unspecified: Secondary | ICD-10-CM

## 2024-03-11 DIAGNOSIS — Z23 Encounter for immunization: Secondary | ICD-10-CM

## 2024-03-11 DIAGNOSIS — N183 Chronic kidney disease, stage 3 unspecified: Secondary | ICD-10-CM

## 2024-03-11 MED ORDER — COVID-19 MRNA VACC (MODERNA) 50 MCG/0.5ML IM SUSY: 0.5000 mL | PREFILLED_SYRINGE | Freq: Once | INTRAMUSCULAR | 0 refills | Status: AC

## 2024-03-11 NOTE — Telephone Encounter (Unsigned)
 Copied from CRM #[id number]. Topic: Clinical - Medication Question >> Mar 11, 2024  3:41 PM Donee H wrote: Reason for CRM: Patient called to request prescription for Covid 19 vaccine. [facility] [address] [place] [address] Phone: [phone] Fax: [phone]

## 2024-03-31 ENCOUNTER — Ambulatory Visit (INDEPENDENT_AMBULATORY_CARE_PROVIDER_SITE_OTHER): Admitting: Family Medicine

## 2024-03-31 ENCOUNTER — Encounter: Payer: Self-pay | Admitting: Family Medicine

## 2024-03-31 VITALS — BP 120/72 | HR 72 | Temp 97.9°F | Ht 61.0 in | Wt 198.8 lb

## 2024-03-31 DIAGNOSIS — Q828 Other specified congenital malformations of skin: Secondary | ICD-10-CM

## 2024-03-31 NOTE — Progress Notes (Signed)
 Subjective:    Patient ID: Rollene LITTIE Fast, female    DOB: October 07, 1945, 78 y.o.   MRN: [id number]   Patient has a painful lesion on the plantar surface of her right foot.  On the plantar surface of the third MTP joint there is evidence of porokeratosis.  There is a clear keratinaceous pore that is extremely tender to palpation.  There is also hyperkeratotic lesion in the arch of her foot that is extremely painful to the touch.  This may be a plantars wart.  Both lesions hurt to put weight on.  Past Medical History:  Diagnosis Date   CKD (chronic kidney disease) stage 3, GFR 30-59 ml/min (HCC)    Hypertension    Thyroid  disease    hypothyroid   Past Surgical History:  Procedure Laterality Date   ABDOMINAL HYSTERECTOMY     CATARACT EXTRACTION     KNEE SURGERY Left    11 surgery    SHOULDER SURGERY Bilateral    Current Outpatient Medications on File Prior to Visit  Medication Sig Dispense Refill   aspirin 81 MG tablet Take 81 mg by mouth daily.     BIOTIN PO Take 1 tablet by mouth daily.     docusate sodium (COLACE) 100 MG capsule Take 200 mg by mouth at bedtime.      hydrochlorothiazide  (HYDRODIURIL ) 25 MG tablet Take 1 tablet (25 mg total) by mouth daily. 90 tablet 1   levocetirizine (XYZAL ) 5 MG tablet TAKE 1 TABLET BY MOUTH EVERY DAY IN THE EVENING 90 tablet 1   levothyroxine  (SYNTHROID ) 100 MCG tablet Take 1 tablet (100 mcg total) by mouth daily. 90 tablet 3   montelukast  (SINGULAIR ) 10 MG tablet Take 1 tablet (10 mg total) by mouth at bedtime. 90 tablet 1   valsartan  (DIOVAN ) 160 MG tablet Take 1 tablet (160 mg total) by mouth daily. 90 tablet 3   No current facility-administered medications on file prior to visit.   Allergies  Allergen Reactions   Erythromycin Base Other (See Comments)   Penicillins    Tylenol  [Acetaminophen ]    Cortisone Other (See Comments) and Rash    given injections-skin peels/redness   Social History   Socioeconomic History   Marital  status: Married    Spouse name: Not on file   Number of children: Not on file   Years of education: Not on file   Highest education level: Not on file  Occupational History   Not on file  Tobacco Use   Smoking status: Never   Smokeless tobacco: Never  Substance and Sexual Activity   Alcohol use: Never   Drug use: Never   Sexual activity: Not on file  Other Topics Concern   Not on file  Social History Narrative   Not on file   Social Drivers of Health   Financial Resource Strain: Low Risk  (05/06/2022)   Received from [facility]   Overall Financial Resource Strain (CARDIA)    Difficulty of Paying Living Expenses: Not hard at all  Food Insecurity: No Food Insecurity (05/06/2022)   Received from [facility]   Food Insecurity    Within the past 12 months, you worried that your food would run out before you got the money to buy more.: 1    Within the past 12 months, the food you bought just didn't last and you didn't have money to get more.: 1  Transportation Needs: No Transportation Needs (05/06/2022)   Received from [facility]   Transportation Needs  In the past 12 months, has lack of transportation kept you from medical appointments or from getting medications?: 2    In the past 12 months, has lack of transportation kept you from meetings, work, or from getting things needed for daily living?: 2  Physical Activity: Inactive (11/29/2021)   Exercise Vital Sign    Days of Exercise per Week: 0 days    Minutes of Exercise per Session: 0 min  Stress: No Stress Concern Present (11/29/2021)   [employer] of Occupational Health - Occupational Stress Questionnaire    Feeling of Stress : Not at all  Social Connections: Socially Integrated (11/29/2021)   Social Connection and Isolation Panel    Frequency of Communication with Friends and Family: More than three times a week    Frequency of Social Gatherings with Friends and Family: More than three times a week    Attends Religious  Services: More than 4 times per year    Active Member of [employer] or Organizations: Yes    Attends Banker Meetings: More than 4 times per year    Marital Status: Married  Catering manager Violence: Not At Risk (02/14/2022)   Received from [facility]   [facility] Safety    Threatened: Not on file    Insulted: Not on file    Physically Hurt : Not on file    Scream: Not on file      Review of Systems  All other systems reviewed and are negative.      Objective:   Physical Exam Vitals reviewed.  Constitutional:      Appearance: She is well-developed.  HENT:     Nose: Nose normal.  Neck:     Thyroid : No thyromegaly.  Cardiovascular:     Rate and Rhythm: Normal rate and regular rhythm.     Heart sounds: Normal heart sounds.  Pulmonary:     Effort: Pulmonary effort is normal. No respiratory distress.     Breath sounds: Normal breath sounds. No wheezing or rales.  Abdominal:     General: Bowel sounds are normal. There is no distension.     Palpations: Abdomen is soft.     Tenderness: There is no abdominal tenderness. There is no guarding or rebound.  Musculoskeletal:     Cervical back: Neck supple.       Feet:  Lymphadenopathy:     Cervical: No cervical adenopathy.           Assessment & Plan:  Porokeratosis - Plan: Ambulatory referral to Podiatry Recommended surgical debridement.  Patient would like to see podiatry to have these lesions removed.

## 2024-04-06 ENCOUNTER — Ambulatory Visit: Admitting: Podiatry

## 2024-04-06 DIAGNOSIS — Q828 Other specified congenital malformations of skin: Secondary | ICD-10-CM | POA: Diagnosis not present

## 2024-04-06 NOTE — Progress Notes (Signed)
  Subjective:  [name], female    DOB: [birth year],  MRN: [id number]  Chief Complaint  Patient presents with   Callouses    78 y.o. female presents with the above complaint.  Patient presents with right midfoot porokeratotic lesion with central nucleated core pain on palpation to the lesion has not seen MRIs prior to seeing me denies any other acute complaints.  Pain scale 7 out of 10 hurts with ambulation hurts with pressure.   Review of Systems: Negative except as noted in the HPI. Denies N/V/F/Ch.  Past Medical History:  Diagnosis Date   CKD (chronic kidney disease) stage 3, GFR 30-59 ml/min (HCC)    Hypertension    Thyroid  disease    hypothyroid    Current Outpatient Medications:    aspirin 81 MG tablet, Take 81 mg by mouth daily., Disp: , Rfl:    BIOTIN PO, Take 1 tablet by mouth daily., Disp: , Rfl:    docusate sodium (COLACE) 100 MG capsule, Take 200 mg by mouth at bedtime. , Disp: , Rfl:    hydrochlorothiazide  (HYDRODIURIL ) 25 MG tablet, Take 1 tablet (25 mg total) by mouth daily., Disp: 90 tablet, Rfl: 1   levocetirizine (XYZAL ) 5 MG tablet, TAKE 1 TABLET BY MOUTH EVERY DAY IN THE EVENING, Disp: 90 tablet, Rfl: 1   levothyroxine  (SYNTHROID ) 100 MCG tablet, Take 1 tablet (100 mcg total) by mouth daily., Disp: 90 tablet, Rfl: 3   montelukast  (SINGULAIR ) 10 MG tablet, Take 1 tablet (10 mg total) by mouth at bedtime., Disp: 90 tablet, Rfl: 1   valsartan  (DIOVAN ) 160 MG tablet, Take 1 tablet (160 mg total) by mouth daily., Disp: 90 tablet, Rfl: 3  Social History   Tobacco Use  Smoking Status Never  Smokeless Tobacco Never    Allergies  Allergen Reactions   Erythromycin Base Other (See Comments)   Penicillins    Tylenol  [Acetaminophen ]    Cortisone Other (See Comments) and Rash    given injections-skin peels/redness   Objective:  There were no vitals filed for this visit. There is no height or weight on file to calculate BMI. Constitutional  Well developed. Well nourished.  Vascular Dorsalis pedis pulses palpable bilaterally. Posterior tibial pulses palpable bilaterally. Capillary refill normal to all digits.  No cyanosis or clubbing noted. Pedal hair growth normal.  Neurologic Normal speech. Oriented to person, place, and time. Epicritic sensation to light touch grossly present bilaterally.  Dermatologic Right plantar midfoot porokeratotic lesion with central nucleated core noted.  Pain on palpation to the lesion.  No open wounds or lesion noted  Orthopedic: Normal joint ROM without pain or crepitus bilaterally. No visible deformities. No bony tenderness.   Radiographs: None Assessment:   1. Porokeratosis    Plan:  Patient was evaluated and treated and all questions answered.  Right midfoot porokeratosis - All questions and concerns were discussed with the patient excessive due to given the amount of pain that she is having she would benefit from debridement of the lesion - Using chisel blade to handle the lesion was debride down to healthy stripe tissue no complication noted no pinpoint bleeding noted.  No follow-ups on file.

## 2024-05-03 ENCOUNTER — Other Ambulatory Visit: Payer: Self-pay | Admitting: Family Medicine

## 2024-05-03 ENCOUNTER — Telehealth: Payer: Self-pay

## 2024-05-03 ENCOUNTER — Other Ambulatory Visit: Payer: Self-pay

## 2024-05-03 DIAGNOSIS — I10 Essential (primary) hypertension: Secondary | ICD-10-CM

## 2024-05-03 MED ORDER — HYDROCHLOROTHIAZIDE 25 MG PO TABS: 25.0000 mg | ORAL_TABLET | Freq: Every day | ORAL | 1 refills | Status: DC

## 2024-05-03 NOTE — Telephone Encounter (Signed)
 Prescription Request  05/03/2024  LOV: 03/31/24  What is the name of the medication or equipment? hydrochlorothiazide  (HYDRODIURIL ) 25 MG tablet [[id number]]   Have you contacted your pharmacy to request a refill? Yes   Which pharmacy would you like this sent to?  [id number] [place] [place] - [address] [place] [address] Phone: [phone] Fax: [phone]    Patient notified that their request is being sent to the clinical staff for review and that they should receive a response within 2 business days.   Please advise at [facility] [phone]  Prescription Request  05/03/2024  LOV: 03/31/24  What is the name of the medication or equipment? montelukast  (SINGULAIR ) 10 MG tablet [[id number]]   Have you contacted your pharmacy to request a refill? Yes   Which pharmacy would you like this sent to?  [id number] [place] [place] - [address] [place] [address] Phone: [phone] Fax: [phone]    Patient notified that their request is being sent to the clinical staff for review and that they should receive a response within 2 business days.   Please advise at [facility] [phone]

## 2024-05-04 ENCOUNTER — Other Ambulatory Visit: Payer: Self-pay | Admitting: Family Medicine

## 2024-05-04 NOTE — Telephone Encounter (Signed)
 Discontinued 12/07/23.  Requested Prescriptions  Pending Prescriptions Disp Refills   valsartan  (DIOVAN ) 80 MG tablet [Pharmacy Med Name: VALSARTAN  80 MG TABLET] 90 tablet 0    Sig: TAKE 1 TABLET BY MOUTH EVERY DAY     Cardiovascular:  Angiotensin Receptor Blockers Failed - 05/04/2024  4:22 PM      Failed - Cr in normal range and within 180 days    Creat  Date Value Ref Range Status  12/03/2023 1.04 (H) 0.60 - 1.00 mg/dL Final         Passed - K in normal range and within 180 days    Potassium  Date Value Ref Range Status  12/03/2023 4.0 3.5 - 5.3 mmol/L Final         Passed - Patient is not pregnant      Passed - Last BP in normal range    BP Readings from Last 1 Encounters:  03/31/24 120/72         Passed - Valid encounter within last 6 months    Recent Outpatient Visits           1 month ago Porokeratosis   [place] [facility] Duanne, Butler DASEN, MD   4 months ago Essential hypertension   [place] [facility] Duanne, Butler DASEN, MD   10 months ago Essential hypertension   [place] [facility] Duanne, Butler DASEN, MD   1 year ago Benign essential HTN   [place] [facility] Duanne, Butler DASEN, MD   1 year ago Benign essential HTN   [place] [facility] Pickard, Butler DASEN, MD

## 2024-05-04 NOTE — Telephone Encounter (Unsigned)
 Copied from CRM [id number]. Topic: Clinical - Medication Refill >> May 04, 2024  4:28 PM Viola F wrote: Medication: valsartan  (DIOVAN ) 160 MG tablet [[id number]] levothyroxine  (SYNTHROID ) 100 MCG tablet [[id number]]  Has the patient contacted their pharmacy? Yes (Agent: If no, request that the patient contact the pharmacy for the refill. If patient does not wish to contact the pharmacy document the reason why and proceed with request.) (Agent: If yes, when and what did the pharmacy advise?)  This is the patient's preferred pharmacy:  [id number] [place] [place] - [address] [place] [address] Phone: [phone] Fax: [phone]  Is this the correct pharmacy for this prescription? Yes If no, delete pharmacy and type the correct one.   Has the prescription been filled recently? Yes  Is the patient out of the medication? No  Has the patient been seen for an appointment in the last year OR does the patient have an upcoming appointment? Yes  Can we respond through MyChart? Yes  Agent: Please be advised that Rx refills may take up to 3 business days. We ask that you follow-up with your pharmacy.

## 2024-05-05 ENCOUNTER — Telehealth: Payer: Self-pay

## 2024-05-05 NOTE — Telephone Encounter (Signed)
 Prescription Request  05/05/2024  LOV: 03/31/24  What is the name of the medication or equipment? montelukast  (SINGULAIR ) 10 MG tablet [[id number]]   Have you contacted your pharmacy to request a refill? Yes   Which pharmacy would you like this sent to?  [facility] [place] [place] - [address] [place] [address] Phone: [phone] Fax: [phone]    Patient notified that their request is being sent to the clinical staff for review and that they should receive a response within 2 business days.   Please advise at [facility] [phone]

## 2024-05-06 ENCOUNTER — Other Ambulatory Visit: Payer: Self-pay

## 2024-05-06 DIAGNOSIS — J329 Chronic sinusitis, unspecified: Secondary | ICD-10-CM

## 2024-05-06 DIAGNOSIS — J209 Acute bronchitis, unspecified: Secondary | ICD-10-CM

## 2024-05-06 MED ORDER — MONTELUKAST SODIUM 10 MG PO TABS: 10.0000 mg | ORAL_TABLET | Freq: Every day | ORAL | 1 refills | Status: AC

## 2024-05-06 NOTE — Telephone Encounter (Signed)
 Too soon for refill.  Requested Prescriptions  Pending Prescriptions Disp Refills   valsartan  (DIOVAN ) 160 MG tablet 90 tablet 3    Sig: Take 1 tablet (160 mg total) by mouth daily.     Cardiovascular:  Angiotensin Receptor Blockers Failed - 05/06/2024  9:08 AM      Failed - Cr in normal range and within 180 days    Creat  Date Value Ref Range Status  12/03/2023 1.04 (H) 0.60 - 1.00 mg/dL Final         Passed - K in normal range and within 180 days    Potassium  Date Value Ref Range Status  12/03/2023 4.0 3.5 - 5.3 mmol/L Final         Passed - Patient is not pregnant      Passed - Last BP in normal range    BP Readings from Last 1 Encounters:  03/31/24 120/72         Passed - Valid encounter within last 6 months    Recent Outpatient Visits           1 month ago Porokeratosis   [place] [facility] Pickard, Butler DASEN, MD   5 months ago Essential hypertension   [place] [facility] Duanne, Butler DASEN, MD   10 months ago Essential hypertension   [place] [facility] Duanne, Butler DASEN, MD   1 year ago Benign essential HTN   [facility] Duanne, Butler DASEN, MD   1 year ago Benign essential HTN   [place] [facility] Pickard, Butler DASEN, MD               levothyroxine  (SYNTHROID ) 100 MCG tablet 90 tablet 3    Sig: Take 1 tablet (100 mcg total) by mouth daily.     Endocrinology:  Hypothyroid Agents Passed - 05/06/2024  9:08 AM      Passed - TSH in normal range and within 360 days    TSH  Date Value Ref Range Status  12/03/2023 3.14 0.40 - 4.50 mIU/L Final         Passed - Valid encounter within last 12 months    Recent Outpatient Visits           1 month ago Porokeratosis   [place] [facility] Pickard, Butler DASEN, MD   5 months ago Essential hypertension   [place] [facility] Duanne, Butler DASEN, MD   10 months ago  Essential hypertension   [place] [facility] Duanne Butler DASEN, MD   1 year ago Benign essential HTN   [place] [facility] Pickard, Butler DASEN, MD   1 year ago Benign essential HTN    [place] Pickard, Butler DASEN, MD

## 2024-05-08 ENCOUNTER — Emergency Department (HOSPITAL_COMMUNITY)

## 2024-05-08 ENCOUNTER — Emergency Department (HOSPITAL_COMMUNITY): Admission: EM | Admit: 2024-05-08 | Discharge: 2024-05-08 | Disposition: A | Discharge status: Home / Self Care

## 2024-05-08 ENCOUNTER — Other Ambulatory Visit: Payer: Self-pay

## 2024-05-08 DIAGNOSIS — M79672 Pain in left foot: Secondary | ICD-10-CM | POA: Diagnosis present

## 2024-05-08 DIAGNOSIS — M109 Gout, unspecified: Secondary | ICD-10-CM | POA: Diagnosis not present

## 2024-05-08 DIAGNOSIS — Z7982 Long term (current) use of aspirin: Secondary | ICD-10-CM | POA: Diagnosis not present

## 2024-05-08 DIAGNOSIS — M7731 Calcaneal spur, right foot: Secondary | ICD-10-CM | POA: Diagnosis not present

## 2024-05-08 DIAGNOSIS — M79673 Pain in unspecified foot: Secondary | ICD-10-CM | POA: Diagnosis not present

## 2024-05-08 LAB — CBC WITH DIFFERENTIAL/PLATELET
Abs Immature Granulocytes: 0.04 K/uL (ref 0.00–0.07)
Basophils Absolute: 0 K/uL (ref 0.0–0.1)
Basophils Relative: 0 %
Eosinophils Absolute: 0.1 K/uL (ref 0.0–0.5)
Eosinophils Relative: 1 %
HCT: 42 % (ref 36.0–46.0)
Hemoglobin: 14.1 g/dL (ref 12.0–15.0)
Immature Granulocytes: 0 %
Lymphocytes Relative: 17 %
Lymphs Abs: 1.9 K/uL (ref 0.7–4.0)
MCH: 31.3 pg (ref 26.0–34.0)
MCHC: 33.6 g/dL (ref 30.0–36.0)
MCV: 93.3 fL (ref 80.0–100.0)
Monocytes Absolute: 0.9 K/uL (ref 0.1–1.0)
Monocytes Relative: 8 %
Neutro Abs: 8.1 K/uL — ABNORMAL HIGH (ref 1.7–7.7)
Neutrophils Relative %: 74 %
Platelets: 264 K/uL (ref 150–400)
RBC: 4.5 MIL/uL (ref 3.87–5.11)
RDW: 12.2 % (ref 11.5–15.5)
WBC: 11 K/uL — ABNORMAL HIGH (ref 4.0–10.5)
nRBC: 0 % (ref 0.0–0.2)

## 2024-05-08 LAB — BASIC METABOLIC PANEL WITH GFR
Anion gap: 14 (ref 5–15)
BUN: 16 mg/dL (ref 8–23)
CO2: 24 mmol/L (ref 22–32)
Calcium: 9.1 mg/dL (ref 8.9–10.3)
Chloride: 99 mmol/L (ref 98–111)
Creatinine, Ser: 1.15 mg/dL — ABNORMAL HIGH (ref 0.44–1.00)
GFR, Estimated: 49 mL/min — ABNORMAL LOW (ref >60)
Glucose, Bld: 120 mg/dL — ABNORMAL HIGH (ref 70–99)
Potassium: 3.4 mmol/L — ABNORMAL LOW (ref 3.5–5.1)
Sodium: 138 mmol/L (ref 135–145)

## 2024-05-08 LAB — PROCALCITONIN: Procalcitonin: <0.1 ng/mL

## 2024-05-08 LAB — URIC ACID: Uric Acid, Serum: 11 mg/dL — ABNORMAL HIGH (ref 2.5–7.1)

## 2024-05-08 MED ORDER — KETOROLAC TROMETHAMINE 15 MG/ML IJ SOLN
15.0000 mg | Freq: Once | INTRAMUSCULAR | Status: AC
  Administered 2024-05-08: 15 mg via INTRAMUSCULAR
  Filled 2024-05-08: qty 1

## 2024-05-08 MED ORDER — OXYCODONE HCL 5 MG PO TABS
5.0000 mg | ORAL_TABLET | Freq: Once | ORAL | Status: AC
  Administered 2024-05-08: 5 mg via ORAL
  Filled 2024-05-08: qty 1

## 2024-05-08 MED ORDER — NAPROXEN 500 MG PO TABS: 500.0000 mg | ORAL_TABLET | Freq: Two times a day (BID) | ORAL | 0 refills | Status: AC

## 2024-05-08 MED ORDER — COLCHICINE 0.6 MG PO TABS: 0.6000 mg | ORAL_TABLET | Freq: Every day | ORAL | 0 refills | Status: DC

## 2024-05-08 MED ORDER — COLCHICINE 0.3 MG HALF TABLET
0.3000 mg | ORAL_TABLET | Freq: Once | ORAL | Status: DC
  Filled 2024-05-08: qty 1

## 2024-05-08 NOTE — ED Triage Notes (Signed)
 Patient come in POV for foot pain that started Friday denises hurting it in any was. States Saturday the foot had gotten worse and not can not bear weight to that foot. 10/10 pain swelling noted to foot.

## 2024-05-08 NOTE — Discharge Instructions (Signed)
 Take your naproxen  and your colchicine as prescribed.  Follow-up with your primary care doctor in 1 to 2 weeks.  Call the office to make an appointment.

## 2024-05-08 NOTE — ED Provider Notes (Signed)
 [place] EMERGENCY DEPARTMENT AT [facility] Provider Note   CSN: [id number] Arrival date & time: 05/08/24  1009     Patient presents with: Foot Pain (Left)   Jill [name] is a 78 y.o. female.   78 year old female presents for evaluation of left foot pain.  States this are over the arch of her foot and in her great toe and now radiates up to her ankle.  Has noticed some ankle swelling as well as some redness around the great toe.  No history of gout.  States she did not fall or hurt it.  Denies any other symptoms or concerns.   Foot Pain Pertinent negatives include no chest pain, no abdominal pain and no shortness of breath.       Prior to Admission medications   Medication Sig Start Date End Date Taking? Authorizing Provider  colchicine 0.6 MG tablet Take 1 tablet (0.6 mg total) by mouth daily for 5 days. 05/08/24 05/13/24 Yes Bayleigh Loflin L, DO  naproxen  (NAPROSYN ) 500 MG tablet Take 1 tablet (500 mg total) by mouth 2 (two) times daily. 05/08/24  Yes Selim Durden L, DO  aspirin 81 MG tablet Take 81 mg by mouth daily.    [provider]  BIOTIN PO Take 1 tablet by mouth daily.    [provider]  docusate sodium (COLACE) 100 MG capsule Take 200 mg by mouth at bedtime.     [provider]  hydrochlorothiazide  (HYDRODIURIL ) 25 MG tablet Take 1 tablet (25 mg total) by mouth daily. 05/03/24   Duanne Butler DASEN, MD  levocetirizine (XYZAL ) 5 MG tablet TAKE 1 TABLET BY MOUTH EVERY DAY IN THE EVENING 02/12/24   Duanne Butler DASEN, MD  levothyroxine  (SYNTHROID ) 100 MCG tablet Take 1 tablet (100 mcg total) by mouth daily. 08/13/23   Kayla Jeoffrey RAMAN, FNP  montelukast  (SINGULAIR ) 10 MG tablet Take 1 tablet (10 mg total) by mouth at bedtime. 05/06/24   Duanne Butler DASEN, MD  valsartan  (DIOVAN ) 160 MG tablet Take 1 tablet (160 mg total) by mouth daily. 12/07/23   Duanne Butler DASEN, MD    Allergies: Erythromycin base, Penicillins, Tylenol  [acetaminophen ],  and Cortisone    Review of Systems  Constitutional:  Negative for chills and fever.  HENT:  Negative for ear pain and sore throat.   Eyes:  Negative for pain and visual disturbance.  Respiratory:  Negative for cough and shortness of breath.   Cardiovascular:  Negative for chest pain and palpitations.  Gastrointestinal:  Negative for abdominal pain and vomiting.  Genitourinary:  Negative for dysuria and hematuria.  Musculoskeletal:  Negative for arthralgias and back pain.       Admits left toe and ankle pain   Skin:  Negative for color change and rash.  Neurological:  Negative for seizures and syncope.  All other systems reviewed and are negative.   Updated Vital Signs BP (!) 140/63   Pulse 69   Temp 98.1 F (36.7 C) (Oral)   Resp 18   Ht 5' 1 (1.549 m)   Wt 88.5 kg   SpO2 95%   BMI 36.84 kg/m   Physical Exam Vitals and nursing note reviewed.  Constitutional:      General: She is not in acute distress.    Appearance: Normal appearance. She is well-developed. She is not ill-appearing.  HENT:     Head: Normocephalic and atraumatic.  Eyes:     Conjunctiva/sclera: Conjunctivae normal.  Cardiovascular:     Rate  and Rhythm: Normal rate and regular rhythm.     Heart sounds: No murmur heard. Pulmonary:     Effort: Pulmonary effort is normal. No respiratory distress.     Breath sounds: Normal breath sounds.  Abdominal:     Palpations: Abdomen is soft.     Tenderness: There is no abdominal tenderness.  Musculoskeletal:        General: Swelling and tenderness present.     Cervical back: Neck supple.     Comments: There is erythema and tenderness palpation over the left great toe MCP with some surrounding swelling up to the ankle but no streaking or signs of injury or deformity  Skin:    General: Skin is warm and dry.     Capillary Refill: Capillary refill takes less than 2 seconds.  Neurological:     Mental Status: She is alert.  Psychiatric:        Mood and Affect: Mood  normal.     (all labs ordered are listed, but only abnormal results are displayed) Labs Reviewed  BASIC METABOLIC PANEL WITH GFR - Abnormal; Notable for the following components:      Result Value   Potassium 3.4 (*)    Glucose, Bld 120 (*)    Creatinine, Ser 1.15 (*)    GFR, Estimated 49 (*)    All other components within normal limits  CBC WITH DIFFERENTIAL/PLATELET - Abnormal; Notable for the following components:   WBC 11.0 (*)    Neutro Abs 8.1 (*)    All other components within normal limits  PROCALCITONIN  URIC ACID    EKG: None  Radiology: DG Foot 2 Views Right Result Date: 05/08/2024 EXAM: 1 OR 2 VIEW(S) XRAY OF THE FOOT 05/08/2024 10:50:00 AM COMPARISON: 04/06/2018 CLINICAL HISTORY: Pain FINDINGS: BONES AND JOINTS: No acute fracture. Large plantar calcaneal spur. No joint dislocation. SOFT TISSUES: The soft tissues are unremarkable. IMPRESSION: 1. No acute findings. 2. Plantar heel spur. Electronically signed by: Waddell Calk MD 05/08/2024 11:13 AM EST RP Workstation: [id number]     Procedures   Medications Ordered in the ED  colchicine tablet 0.3 mg (0.3 mg Oral Not Given 05/08/24 1336)  oxyCODONE  (Oxy IR/ROXICODONE ) immediate release tablet 5 mg (5 mg Oral Given 05/08/24 1048)  ketorolac (TORADOL) 15 MG/ML injection 15 mg (15 mg Intramuscular Given 05/08/24 1048)                                    Medical Decision Making Patient with evidence of gout.  No signs of infection.  Will start her on colchicine and naproxen .  Given her first doses here.  Advise close follow-up with primary care otherwise return to the ER for new or worsening symptoms.  She feels comfortable to plan to be discharged home.  Problems Addressed: Acute gout involving toe of right foot, unspecified cause: acute illness or injury  Amount and/or Complexity of Data Reviewed External Data Reviewed: notes.    Details: No prior ED visits for review Labs: ordered. Decision-making details  documented in ED Course.    Details: Ordered and reviewed by me and unremarkable, no leukocytosis, negative procalcitonin Radiology: ordered and independent interpretation performed. Decision-making details documented in ED Course.    Details: Ordered and inter by me independently radiology Right foot x-ray: Shows no acute abnormality  Risk OTC drugs. Prescription drug management.    Final diagnoses:  Acute gout involving toe of right  foot, unspecified cause    ED Discharge Orders          Ordered    naproxen  (NAPROSYN ) 500 MG tablet  2 times daily        05/08/24 1240    colchicine 0.6 MG tablet  Daily        05/08/24 1240               Mahlia Fernando, [place] L, DO 05/08/24 1449

## 2024-05-16 ENCOUNTER — Encounter: Payer: Self-pay | Admitting: Family Medicine

## 2024-05-16 ENCOUNTER — Ambulatory Visit (INDEPENDENT_AMBULATORY_CARE_PROVIDER_SITE_OTHER): Admitting: Family Medicine

## 2024-05-16 VITALS — BP 120/72 | HR 64 | Temp 97.9°F | Ht 61.0 in | Wt 192.0 lb

## 2024-05-16 DIAGNOSIS — M109 Gout, unspecified: Secondary | ICD-10-CM | POA: Diagnosis not present

## 2024-05-16 MED ORDER — ALLOPURINOL 300 MG PO TABS
300.0000 mg | ORAL_TABLET | Freq: Every day | ORAL | 6 refills | Status: AC
Start: 2024-05-16 — End: ?

## 2024-05-16 MED ORDER — VALSARTAN 160 MG PO TABS: 160.0000 mg | ORAL_TABLET | Freq: Every day | ORAL | 3 refills | Status: AC

## 2024-05-16 MED ORDER — COLCHICINE 0.6 MG PO TABS: 0.6000 mg | ORAL_TABLET | Freq: Every day | ORAL | 11 refills | Status: AC

## 2024-05-16 NOTE — Progress Notes (Signed)
 Subjective:    [name], female    DOB: 15-Apr-1946, 78 y.o.   MRN: [id number]    Patient has no family history of gout.  She is currently on hydrochlorothiazide  for hypertension.  Recently had to go to the hospital for pain in her left foot.  The pain began in her arch and then became so severe that she could not even put weight on her left foot.  She did not even want the sheets to touch her left foot.  She had pain redness and swelling in her left MTP joint.  In the hospital they did a uric acid level that was 11!  Patient was given naproxen  and colchicine.  The pain went away approximately 1 week ago.  She is still taking naproxen . Past Medical History:  Diagnosis Date   CKD (chronic kidney disease) stage 3, GFR 30-59 ml/min (HCC)    Hypertension    Thyroid  disease    hypothyroid   Past Surgical History:  Procedure Laterality Date   ABDOMINAL HYSTERECTOMY     CATARACT EXTRACTION     KNEE SURGERY Left    11 surgery    SHOULDER SURGERY Bilateral    Current Outpatient Medications on File Prior to Visit  Medication Sig Dispense Refill   aspirin 81 MG tablet Take 81 mg by mouth daily.     BIOTIN PO Take 1 tablet by mouth daily.     docusate sodium (COLACE) 100 MG capsule Take 200 mg by mouth at bedtime.      hydrochlorothiazide  (HYDRODIURIL ) 25 MG tablet Take 1 tablet (25 mg total) by mouth daily. 90 tablet 1   levocetirizine (XYZAL ) 5 MG tablet TAKE 1 TABLET BY MOUTH EVERY DAY IN THE EVENING 90 tablet 1   levothyroxine  (SYNTHROID ) 100 MCG tablet Take 1 tablet (100 mcg total) by mouth daily. 90 tablet 3   montelukast  (SINGULAIR ) 10 MG tablet Take 1 tablet (10 mg total) by mouth at bedtime. 90 tablet 1   naproxen  (NAPROSYN ) 500 MG tablet Take 1 tablet (500 mg total) by mouth 2 (two) times daily. 30 tablet 0   No current facility-administered medications on file prior to visit.   Allergies  Allergen Reactions   Erythromycin Base Other (See Comments)    Penicillins    Tylenol  [Acetaminophen ]    Cortisone Other (See Comments) and Rash    given injections-skin peels/redness   Social History   Socioeconomic History   Marital status: Married    Spouse name: Not on file   Number of children: Not on file   Years of education: Not on file   Highest education level: Not on file  Occupational History   Not on file  Tobacco Use   Smoking status: Never   Smokeless tobacco: Never  Substance and Sexual Activity   Alcohol use: Never   Drug use: Never   Sexual activity: Not on file  Other Topics Concern   Not on file  Social History Narrative   Not on file   Social Drivers of Health   Financial Resource Strain: Low Risk (05/06/2022)   Received from [facility]   Overall Financial Resource Strain (CARDIA)    Difficulty of Paying Living Expenses: Not hard at all  Food Insecurity: No Food Insecurity (05/06/2022)   Received from [facility]   Food Insecurity    Within the past 12 months, you worried that your food would run out before you got the money to buy more.: 1  Within the past 12 months, the food you bought just didn't last and you didn't have money to get more.: 1  Transportation Needs: No Transportation Needs (05/06/2022)   Received from [facility]   Transportation Needs    In the past 12 months, has lack of transportation kept you from medical appointments or from getting medications?: 2    In the past 12 months, has lack of transportation kept you from meetings, work, or from getting things needed for daily living?: 2  Physical Activity: Inactive (11/29/2021)   Exercise Vital Sign    Days of Exercise per Week: 0 days    Minutes of Exercise per Session: 0 min  Stress: No Stress Concern Present (11/29/2021)   [employer] of Occupational Health - Occupational Stress Questionnaire    Feeling of Stress : Not at all  Social Connections: Socially Integrated (11/29/2021)   Social Connection and Isolation Panel    Frequency of  Communication with Friends and Family: More than three times a week    Frequency of Social Gatherings with Friends and Family: More than three times a week    Attends Religious Services: More than 4 times per year    Active Member of [employer] or Organizations: Yes    Attends Banker Meetings: More than 4 times per year    Marital Status: Married  Catering Manager Violence: Not At Risk (02/14/2022)   Received from [facility]   [facility] Safety    Threatened: Not on file    Insulted: Not on file    Physically Hurt : Not on file    Scream: Not on file      Review of Systems  All other systems reviewed and are negative.      Objective:   Physical Exam Vitals reviewed.  Constitutional:      Appearance: She is well-developed.  HENT:     Nose: Nose normal.  Neck:     Thyroid : No thyromegaly.  Cardiovascular:     Rate and Rhythm: Normal rate and regular rhythm.     Heart sounds: Normal heart sounds.  Pulmonary:     Effort: Pulmonary effort is normal. No respiratory distress.     Breath sounds: Normal breath sounds. No wheezing or rales.  Abdominal:     General: Bowel sounds are normal. There is no distension.     Palpations: Abdomen is soft.     Tenderness: There is no abdominal tenderness. There is no guarding or rebound.  Musculoskeletal:     Cervical back: Neck supple.     Left foot: Normal range of motion. No deformity.       Feet:  Feet:     Left foot:     Skin integrity: No ulcer.  Lymphadenopathy:     Cervical: No cervical adenopathy.           Assessment & Plan:  Acute gout, unspecified cause, unspecified site Continue colchicine 0.6 mg daily for 1 more week.  After 1 more week, patient can add allopurinol 300 mg daily for prevention.  We will try to lower uric acid level less than 6.  It is currently 11.  In 3 months I would recheck the uric acid level.  If uric acid level is less than 6 at that point we will discontinue colchicine and use this only  as a preventative.

## 2024-06-08 ENCOUNTER — Other Ambulatory Visit

## 2024-06-08 DIAGNOSIS — I10 Essential (primary) hypertension: Secondary | ICD-10-CM | POA: Diagnosis not present

## 2024-06-08 DIAGNOSIS — E785 Hyperlipidemia, unspecified: Secondary | ICD-10-CM | POA: Diagnosis not present

## 2024-06-08 DIAGNOSIS — E039 Hypothyroidism, unspecified: Secondary | ICD-10-CM | POA: Diagnosis not present

## 2024-06-08 DIAGNOSIS — N183 Chronic kidney disease, stage 3 unspecified: Secondary | ICD-10-CM

## 2024-06-08 LAB — CBC WITH DIFFERENTIAL/PLATELET
Absolute Lymphocytes: 2162 {cells}/uL (ref 850–3900)
Absolute Monocytes: 371 {cells}/uL (ref 200–950)
Basophils Absolute: 90 {cells}/uL (ref 0–200)
Basophils Relative: 1.7 %
Eosinophils Absolute: 504 {cells}/uL — ABNORMAL HIGH (ref 15–500)
Eosinophils Relative: 9.5 %
HCT: 39.7 % (ref 35.9–46.0)
Hemoglobin: 12.9 g/dL (ref 11.7–15.5)
MCH: 30.8 pg (ref 27.0–33.0)
MCHC: 32.5 g/dL (ref 31.6–35.4)
MCV: 94.7 fL (ref 81.4–101.7)
MPV: 10.3 fL (ref 7.5–12.5)
Monocytes Relative: 7 %
Neutro Abs: 2173 {cells}/uL (ref 1500–7800)
Neutrophils Relative %: 41 %
Platelets: 233 Thousand/uL (ref 140–400)
RBC: 4.19 Million/uL (ref 3.80–5.10)
RDW: 12.2 % (ref 11.0–15.0)
Total Lymphocyte: 40.8 %
WBC: 5.3 Thousand/uL (ref 3.8–10.8)

## 2024-06-08 LAB — COMPLETE METABOLIC PANEL WITHOUT GFR
AG Ratio: 1.6 (calc) (ref 1.0–2.5)
ALT: 16 U/L (ref 6–29)
AST: 18 U/L (ref 10–35)
Albumin: 3.8 g/dL (ref 3.6–5.1)
Alkaline phosphatase (APISO): 56 U/L (ref 37–153)
BUN: 10 mg/dL (ref 7–25)
CO2: 26 mmol/L (ref 20–32)
Calcium: 8.7 mg/dL (ref 8.6–10.4)
Chloride: 110 mmol/L (ref 98–110)
Creat: 0.9 mg/dL (ref 0.60–1.00)
Globulin: 2.4 g/dL (ref 1.9–3.7)
Glucose, Bld: 105 mg/dL — ABNORMAL HIGH (ref 65–99)
Potassium: 4.1 mmol/L (ref 3.5–5.3)
Sodium: 143 mmol/L (ref 135–146)
Total Bilirubin: 0.3 mg/dL (ref 0.2–1.2)
Total Protein: 6.2 g/dL (ref 6.1–8.1)

## 2024-06-08 LAB — LIPID PANEL
Cholesterol: 162 mg/dL (ref <200)
HDL: 54 mg/dL (ref >=50)
LDL Cholesterol (Calc): 93 mg/dL
Non-HDL Cholesterol (Calc): 108 mg/dL (ref <130)
Total CHOL/HDL Ratio: 3 (calc) (ref <5.0)
Triglycerides: 67 mg/dL (ref <150)

## 2024-06-08 LAB — TSH: TSH: 3.98 m[IU]/L (ref 0.40–4.50)

## 2024-06-08 NOTE — Progress Notes (Signed)
 Tsh

## 2024-06-09 ENCOUNTER — Ambulatory Visit: Payer: Self-pay | Admitting: Family Medicine

## 2024-06-16 ENCOUNTER — Ambulatory Visit: Admitting: Family Medicine

## 2024-06-16 ENCOUNTER — Encounter: Payer: Self-pay | Admitting: Family Medicine

## 2024-06-16 VITALS — BP 126/62 | HR 86 | Temp 99.3°F | Ht 61.0 in | Wt 197.0 lb

## 2024-06-16 DIAGNOSIS — E785 Hyperlipidemia, unspecified: Secondary | ICD-10-CM | POA: Diagnosis not present

## 2024-06-16 DIAGNOSIS — Z1211 Encounter for screening for malignant neoplasm of colon: Secondary | ICD-10-CM

## 2024-06-16 DIAGNOSIS — E039 Hypothyroidism, unspecified: Secondary | ICD-10-CM

## 2024-06-16 DIAGNOSIS — I10 Essential (primary) hypertension: Secondary | ICD-10-CM

## 2024-06-16 DIAGNOSIS — Z0000 Encounter for general adult medical examination without abnormal findings: Secondary | ICD-10-CM

## 2024-06-16 DIAGNOSIS — N183 Chronic kidney disease, stage 3 unspecified: Secondary | ICD-10-CM | POA: Diagnosis not present

## 2024-06-16 DIAGNOSIS — Z0001 Encounter for general adult medical examination with abnormal findings: Secondary | ICD-10-CM

## 2024-06-16 NOTE — Progress Notes (Addendum)
 Subjective:    [name], female    DOB: [birth year], 78 y.o.   MRN: [id number]   When I last saw the patient, she was having a gout exacerbation.  She is currently on allopurinol  as well as colchicine .  She is due to recheck a uric acid level in late January early February.  She stopped hydrochlorothiazide .  Her blood pressure today is outstanding at 126/62.  She is due today for physical exam.  Reviewing her shot records, she is due for a flu shot as well as RSV shot.  Her pneumonia shot and shingles shot is up-to-date.  She declines the flu shot today and prefers to get this from her pharmacy.  Her mammogram is due again in March.  Her Cologuard is due now.  She would like to repeat the Cologuard.  She states that she has been having loose stool for the last few months 1-2 times a day.  I recommended a colonoscopy as I suspect that she may have microscopic colitis versus medication side effect from colchicine .  She would prefer to do the Cologuard.  She is due for a bone density test but she defers this at the present time.  Most recent lab work is listed below  No visits with results within 1 Week(s) from this visit.  Latest known visit with results is:  Lab on 06/08/2024  Component Date Value Ref Range Status   WBC 06/08/2024 5.3  3.8 - 10.8 Thousand/uL Final   RBC 06/08/2024 4.19  3.80 - 5.10 Million/uL Final   Hemoglobin 06/08/2024 12.9  11.7 - 15.5 g/dL Final   HCT 87/89/7974 39.7  35.9 - 46.0 % Final   MCV 06/08/2024 94.7  81.4 - 101.7 fL Final   MCH 06/08/2024 30.8  27.0 - 33.0 pg Final   MCHC 06/08/2024 32.5  31.6 - 35.4 g/dL Final   RDW 87/89/7974 12.2  11.0 - 15.0 % Final   Platelets 06/08/2024 233  140 - 400 Thousand/uL Final   MPV 06/08/2024 10.3  7.5 - 12.5 fL Final   Neutro Abs 06/08/2024 2,173  1,500 - 7,800 cells/uL Final   Absolute Lymphocytes 06/08/2024 2,162  850 - 3,900 cells/uL Final   Absolute Monocytes 06/08/2024 371  200 - 950 cells/uL Final    Eosinophils Absolute 06/08/2024 504 (H)  15 - 500 cells/uL Final   Basophils Absolute 06/08/2024 90  0 - 200 cells/uL Final   Neutrophils Relative % 06/08/2024 41  % Final   Total Lymphocyte 06/08/2024 40.8  % Final   Monocytes Relative 06/08/2024 7.0  % Final   Eosinophils Relative 06/08/2024 9.5  % Final   Basophils Relative 06/08/2024 1.7  % Final   Glucose, Bld 06/08/2024 105 (H)  65 - 99 mg/dL Final   Comment: .            Fasting reference interval . For someone without known diabetes, a glucose value between 100 and 125 mg/dL is consistent with prediabetes and should be confirmed with a follow-up test. .    BUN 06/08/2024 10  7 - 25 mg/dL Final   Creat 87/89/7974 0.90  0.60 - 1.00 mg/dL Final   BUN/Creatinine Ratio 06/08/2024 SEE NOTE:  6 - 22 (calc) Final   Comment:    Not Reported: BUN and Creatinine are within    reference range. .    Sodium 06/08/2024 143  135 - 146 mmol/L Final   Potassium 06/08/2024 4.1  3.5 - 5.3 mmol/L Final  Chloride 06/08/2024 110  98 - 110 mmol/L Final   CO2 06/08/2024 26  20 - 32 mmol/L Final   Calcium 06/08/2024 8.7  8.6 - 10.4 mg/dL Final   Total Protein 87/89/7974 6.2  6.1 - 8.1 g/dL Final   Albumin 87/89/7974 3.8  3.6 - 5.1 g/dL Final   Globulin 87/89/7974 2.4  1.9 - 3.7 g/dL (calc) Final   AG Ratio 06/08/2024 1.6  1.0 - 2.5 (calc) Final   Total Bilirubin 06/08/2024 0.3  0.2 - 1.2 mg/dL Final   Alkaline phosphatase (APISO) 06/08/2024 56  37 - 153 U/L Final   AST 06/08/2024 18  10 - 35 U/L Final   ALT 06/08/2024 16  6 - 29 U/L Final   Cholesterol 06/08/2024 162  <200 mg/dL Final   HDL 87/89/7974 54  > OR = 50 mg/dL Final   Triglycerides 87/89/7974 67  <150 mg/dL Final   LDL Cholesterol (Calc) 06/08/2024 93  mg/dL (calc) Final   Comment: Reference range: <100 . Desirable range <100 mg/dL for primary prevention;   <70 mg/dL for patients with CHD or diabetic patients  with > or = 2 CHD risk factors. SABRA LDL-C is now calculated using  the Martin-Hopkins  calculation, which is a validated novel method providing  better accuracy than the Friedewald equation in the  estimation of LDL-C.  Gladis APPLETHWAITE et al. SANDREA. 7986;689(80): 2061-2068  (http://education.[facility].com/faq/FAQ164)    Total CHOL/HDL Ratio 06/08/2024 3.0  <4.9 (calc) Final   Non-HDL Cholesterol (Calc) 06/08/2024 108  <130 mg/dL (calc) Final   Comment: For patients with diabetes plus 1 major ASCVD risk  factor, treating to a non-HDL-C goal of <100 mg/dL  (LDL-C of <29 mg/dL) is considered a therapeutic  option.    TSH 06/08/2024 3.98  0.40 - 4.50 mIU/L Final    Past Medical History:  Diagnosis Date   CKD (chronic kidney disease) stage 3, GFR 30-59 ml/min (HCC)    Hypertension    Thyroid  disease    hypothyroid   Past Surgical History:  Procedure Laterality Date   ABDOMINAL HYSTERECTOMY     CATARACT EXTRACTION     KNEE SURGERY Left    11 surgery    SHOULDER SURGERY Bilateral    Current Outpatient Medications on File Prior to Visit  Medication Sig Dispense Refill   allopurinol  (ZYLOPRIM ) 300 MG tablet Take 1 tablet (300 mg total) by mouth daily. 30 tablet 6   aspirin 81 MG tablet Take 81 mg by mouth daily.     BIOTIN PO Take 1 tablet by mouth daily.     colchicine  0.6 MG tablet Take 1 tablet (0.6 mg total) by mouth daily. 30 tablet 11   docusate sodium (COLACE) 100 MG capsule Take 200 mg by mouth at bedtime.      hydrochlorothiazide  (HYDRODIURIL ) 25 MG tablet Take 1 tablet (25 mg total) by mouth daily. 90 tablet 1   levocetirizine (XYZAL ) 5 MG tablet TAKE 1 TABLET BY MOUTH EVERY DAY IN THE EVENING 90 tablet 1   levothyroxine  (SYNTHROID ) 100 MCG tablet Take 1 tablet (100 mcg total) by mouth daily. 90 tablet 3   montelukast  (SINGULAIR ) 10 MG tablet Take 1 tablet (10 mg total) by mouth at bedtime. 90 tablet 1   naproxen  (NAPROSYN ) 500 MG tablet Take 1 tablet (500 mg total) by mouth 2 (two) times daily. 30 tablet 0   valsartan  (DIOVAN ) 160 MG  tablet Take 1 tablet (160 mg total) by mouth daily. 90 tablet 3  No current facility-administered medications on file prior to visit.   Allergies  Allergen Reactions   Erythromycin Base Other (See Comments)   Penicillins    Tylenol  [Acetaminophen ]    Cortisone Other (See Comments) and Rash    given injections-skin peels/redness   Social History   Socioeconomic History   Marital status: Married    Spouse name: Not on file   Number of children: Not on file   Years of education: Not on file   Highest education level: Not on file  Occupational History   Not on file  Tobacco Use   Smoking status: Never   Smokeless tobacco: Never  Substance and Sexual Activity   Alcohol use: Never   Drug use: Never   Sexual activity: Not on file  Other Topics Concern   Not on file  Social History Narrative   Not on file   Social Drivers of Health   Tobacco Use: Low Risk (05/16/2024)   Patient History    Smoking Tobacco Use: Never    Smokeless Tobacco Use: Never    Passive Exposure: Not on file  Financial Resource Strain: Low Risk (05/06/2022)   Received from [facility]   Overall Financial Resource Strain (CARDIA)    Difficulty of Paying Living Expenses: Not hard at all  Food Insecurity: No Food Insecurity (05/06/2022)   Received from [facility]   Food Insecurity    Within the past 12 months, you worried that your food would run out before you got the money to buy more.: 1    Within the past 12 months, the food you bought just didn't last and you didn't have money to get more.: 1  Transportation Needs: No Transportation Needs (05/06/2022)   Received from [facility]   Transportation Needs    In the past 12 months, has lack of transportation kept you from medical appointments or from getting medications?: 2    In the past 12 months, has lack of transportation kept you from meetings, work, or from getting things needed for daily living?: 2  Physical Activity: Inactive (11/29/2021)    Exercise Vital Sign    Days of Exercise per Week: 0 days    Minutes of Exercise per Session: 0 min  Stress: No Stress Concern Present (11/29/2021)   [employer] of Occupational Health - Occupational Stress Questionnaire    Feeling of Stress : Not at all  Social Connections: Socially Integrated (11/29/2021)   Social Connection and Isolation Panel    Frequency of Communication with Friends and Family: More than three times a week    Frequency of Social Gatherings with Friends and Family: More than three times a week    Attends Religious Services: More than 4 times per year    Active Member of Clubs or Organizations: Yes    Attends Banker Meetings: More than 4 times per year    Marital Status: Married  Catering Manager Violence: Not At Risk (02/14/2022)   Received from [facility]   [facility] Safety    Threatened: Not on file    Insulted: Not on file    Physically Hurt : Not on file    Scream: Not on file  Depression (PHQ2-9): Low Risk (05/16/2024)   Depression (PHQ2-9)    PHQ-2 Score: 0  Alcohol Screen: Low Risk (11/29/2021)   Alcohol Screen    Last Alcohol Screening Score (AUDIT): 0  Housing: Low Risk (11/29/2021)   Housing    Last Housing Risk Score: 0  Utilities: Not on file  Health Literacy: Not on file      Review of Systems  All other systems reviewed and are negative.      Objective:   Physical Exam Vitals reviewed.  Constitutional:      Appearance: She is well-developed.  HENT:     Nose: Nose normal.  Neck:     Thyroid : No thyromegaly.  Cardiovascular:     Rate and Rhythm: Normal rate and regular rhythm.     Heart sounds: Normal heart sounds.  Pulmonary:     Effort: Pulmonary effort is normal. No respiratory distress.     Breath sounds: Normal breath sounds. No wheezing or rales.  Abdominal:     General: Bowel sounds are normal. There is no distension.     Palpations: Abdomen is soft.     Tenderness: There is no abdominal tenderness. There is no  guarding or rebound.  Musculoskeletal:     Cervical back: Neck supple.  Lymphadenopathy:     Cervical: No cervical adenopathy.           Assessment & Plan:  Benign essential HTN  Hypothyroidism, unspecified type  Stage 3 chronic kidney disease, unspecified whether stage 3a or 3b CKD (HCC)  Hyperlipidemia, unspecified hyperlipidemia type  General medical exam  Colon cancer screening - Plan: Cologuard Labs are outstanding.  Recommended a flu shot.  Recommended RSV.  Mammogram is due again in March.  Patient defers her bone density test until March and she states that she will schedule it at that time.  She declines colonoscopy but I will order Cologuard.  Blood pressure and cholesterol and thyroid  test are outstanding.  I reviewed all of her lab work with her.  Recheck uric acid level in about 2 months to see if the allopurinol  has her uric acid level less than 6.  Regular anticipatory guidance is provided.  TSH shows that her levothyroxine  is at an appropriate dose. GFR is stable showing no progression of her CKD.

## 2024-07-01 ENCOUNTER — Telehealth: Payer: Self-pay

## 2024-07-01 NOTE — Telephone Encounter (Signed)
"  Pt informed and verbalized understanding.   "

## 2024-07-01 NOTE — Telephone Encounter (Signed)
 Copied from CRM [id number]. Topic: Clinical - Medical Advice >> Jul 01, 2024 12:21 PM Kevelyn M wrote: Reason for CRM: Patient checking to see if she's got gout, should she get a flu shot. Would like to get the flu shot before vacay on Wednesday if she is to get one.  Call back # [phone]

## 2024-07-30 ENCOUNTER — Other Ambulatory Visit: Payer: Self-pay | Admitting: Family Medicine

## 2024-07-30 DIAGNOSIS — J329 Chronic sinusitis, unspecified: Secondary | ICD-10-CM
